# Patient Record
Sex: Female | Born: 1955
Health system: Southern US, Community
[De-identification: ages and names within clinical notes are randomized; demographics above are authoritative.]

## PROBLEM LIST (undated history)

## (undated) DIAGNOSIS — J309 Allergic rhinitis, unspecified: Secondary | ICD-10-CM

## (undated) DIAGNOSIS — R011 Cardiac murmur, unspecified: Secondary | ICD-10-CM

## (undated) DIAGNOSIS — F32A Depression, unspecified: Secondary | ICD-10-CM

## (undated) DIAGNOSIS — B182 Chronic viral hepatitis C: Secondary | ICD-10-CM

## (undated) DIAGNOSIS — F329 Major depressive disorder, single episode, unspecified: Secondary | ICD-10-CM

## (undated) HISTORY — DX: Allergic rhinitis, unspecified: J30.9

## (undated) HISTORY — DX: Cardiac murmur, unspecified: R01.1

## (undated) HISTORY — DX: Major depressive disorder, single episode, unspecified: F32.9

## (undated) HISTORY — DX: Chronic viral hepatitis C: B18.2

## (undated) HISTORY — DX: Depression, unspecified: F32.A

---

## 2009-03-18 ENCOUNTER — Emergency Department (HOSPITAL_BASED_OUTPATIENT_CLINIC_OR_DEPARTMENT_OTHER): Admission: EM | Admit: 2009-03-18 | Discharge: 2009-03-18 | Payer: Self-pay | Admitting: Emergency Medicine

## 2009-03-18 ENCOUNTER — Ambulatory Visit: Payer: Self-pay | Admitting: Diagnostic Radiology

## 2010-04-11 LAB — URINALYSIS, ROUTINE W REFLEX MICROSCOPIC
Ketones, ur: NEGATIVE mg/dL
Protein, ur: NEGATIVE mg/dL
Specific Gravity, Urine: 1.014 (ref 1.005–1.030)
Urobilinogen, UA: 0.2 mg/dL (ref 0.0–1.0)
pH: 6.5 (ref 5.0–8.0)

## 2010-04-11 LAB — URINE CULTURE: Culture: NO GROWTH

## 2011-02-17 ENCOUNTER — Ambulatory Visit: Payer: Self-pay

## 2011-02-18 ENCOUNTER — Ambulatory Visit: Payer: Self-pay | Admitting: Physician Assistant

## 2011-02-18 VITALS — BP 114/69 | HR 66 | Temp 97.6°F | Resp 16 | Ht 71.0 in | Wt 184.2 lb

## 2011-02-18 DIAGNOSIS — J329 Chronic sinusitis, unspecified: Secondary | ICD-10-CM

## 2011-02-18 MED ORDER — AMOXICILLIN 875 MG PO TABS: ORAL_TABLET | ORAL | Status: DC

## 2011-02-18 MED ORDER — IPRATROPIUM BROMIDE 0.06 % NA SOLN: 2.0000 | Freq: Four times a day (QID) | NASAL | Status: DC

## 2011-02-18 NOTE — Patient Instructions (Signed)

## 2011-02-19 NOTE — Progress Notes (Signed)
  Subjective:    Patient ID: Renee Rice, female    DOB: February 02, 1955, 56 y.o.   MRN: [id number]  HPI Ms. Ambrocio c/o 10 days for severe head congestion with thick yellow mucous from nose.  Getting worse.  Very little ST or cough. HA (frontal). Using OTC meds.     Review of Systems  HENT: Positive for congestion, sore throat, facial swelling and rhinorrhea. Negative for ear discharge.   Respiratory: Negative for cough and wheezing.   Musculoskeletal: Negative for myalgias.  Neurological: Positive for headaches.       Objective:   Physical Exam  Constitutional: Vital signs are normal. She appears well-developed and well-nourished.  HENT:  Right Ear: Tympanic membrane normal.  Left Ear: Tympanic membrane normal.  Nose: Mucosal edema and rhinorrhea present. Right sinus exhibits frontal sinus tenderness. Left sinus exhibits frontal sinus tenderness.  Mouth/Throat: Posterior oropharyngeal erythema present. No oropharyngeal exudate.  Eyes: Conjunctivae and lids are normal.  Cardiovascular: Normal rate and regular rhythm.   Pulmonary/Chest: Effort normal and breath sounds normal.  Lymphadenopathy:    She has no cervical adenopathy.          Assessment & Plan:   1. Sinusitis  amoxicillin (AMOXIL) 875 MG tablet, ipratropium (ATROVENT) 0.06 % nasal spray   Use Mucinex D Return if symptoms worsen or no improvement 3 days

## 2011-02-20 ENCOUNTER — Telehealth: Payer: Self-pay

## 2011-02-20 NOTE — Telephone Encounter (Signed)
Spoke with pt to check status. Pt reported she is "SO much better" and would like to hug Alicia's neck. Pt also apologized for being so "hateful" when she had to wait for a long time for OV. Apologized to pt again for long wait.

## 2011-10-22 ENCOUNTER — Ambulatory Visit (INDEPENDENT_AMBULATORY_CARE_PROVIDER_SITE_OTHER): Payer: PRIVATE HEALTH INSURANCE | Admitting: Physician Assistant

## 2011-10-22 VITALS — BP 115/70 | HR 69 | Temp 97.8°F | Resp 16 | Ht 72.0 in | Wt 181.0 lb

## 2011-10-22 DIAGNOSIS — L259 Unspecified contact dermatitis, unspecified cause: Secondary | ICD-10-CM

## 2011-10-22 DIAGNOSIS — L282 Other prurigo: Secondary | ICD-10-CM

## 2011-10-22 MED ORDER — METHYLPREDNISOLONE ACETATE 80 MG/ML IJ SUSP
80.0000 mg | Freq: Once | INTRAMUSCULAR | Status: AC
  Administered 2011-10-22: 80 mg via INTRAMUSCULAR

## 2011-10-22 MED ORDER — PREDNISONE 20 MG PO TABS: ORAL_TABLET | ORAL | Status: DC

## 2011-10-22 MED ORDER — CLOBETASOL PROPIONATE 0.05 % EX CREA: TOPICAL_CREAM | Freq: Two times a day (BID) | CUTANEOUS | Status: DC

## 2011-10-22 NOTE — Progress Notes (Signed)
  Subjective:    Patient ID: Renee Rice, female    DOB: 13-Apr-1955, 56 y.o.   MRN: [id number]  HPI 56 year old female presents with 1 week history of pruritic rash on bilateral arms and lower legs. She was working outside in her yard 1 week ago pulling vines and discovered there was poison ivy as well.  Since she has had progressive worsening of an erythematous, pruritic rash.  No SOB, trouble breathing, lip/tongue swelling, fever, or chills. She has been using several OTC topical agents including alcohol and calamine lotion as well as taking benadryl daily.     Review of Systems  Constitutional: Negative for fever and chills.  Respiratory: Negative for cough, chest tightness, shortness of breath and wheezing.   Skin: Positive for rash.  All other systems reviewed and are negative.       Objective:   Physical Exam  Constitutional: She is oriented to person, place, and time. She appears well-developed and well-nourished.  HENT:  Head: Normocephalic and atraumatic.  Right Ear: External ear normal.  Left Ear: External ear normal.  Eyes: Conjunctivae normal are normal.  Neck: Normal range of motion.  Cardiovascular: Normal rate.   Pulmonary/Chest: Effort normal.  Neurological: She is alert and oriented to person, place, and time.  Skin:       Diffuse erythematous linear rash over forearms and lower legs.   Psychiatric: She has a normal mood and affect. Her behavior is normal. Judgment and thought content normal.          Assessment & Plan:   1. Contact dermatitis  methylPREDNISolone acetate (DEPO-MEDROL) injection 80 mg, predniSONE (DELTASONE) 20 MG tablet, clobetasol cream (TEMOVATE) 0.05 %  2. Pruritic rash    Depomedrol 80 mg IM today Zyrtec daily in the morning Benadryl 50 mg qhs Prednisone taper Clobetasol bid

## 2011-10-22 NOTE — Patient Instructions (Addendum)
Take Zyrtec daily in the morning Benadryl 50 mg at bedtime Apply thin layer of Clobetasol cream to affected area twice daily  Poison [facility] ivy is a inflammation of the skin (contact dermatitis) caused by touching the allergens on the leaves of the ivy plant following previous exposure to the plant. The rash usually appears 48 hours after exposure. The rash is usually bumps (papules) or blisters (vesicles) in a linear pattern. Depending on your own sensitivity, the rash may simply cause redness and itching, or it may also progress to blisters which may break open. These must be well cared for to prevent secondary bacterial (germ) infection, followed by scarring. Keep any open areas dry, clean, dressed, and covered with an antibacterial ointment if needed. The eyes may also get puffy. The puffiness is worst in the morning and gets better as the day progresses. This dermatitis usually heals without scarring, within 2 to 3 weeks without treatment. HOME CARE INSTRUCTIONS  Thoroughly wash with soap and water as soon as you have been exposed to poison ivy. You have about one half hour to remove the plant resin before it will cause the rash. This washing will destroy the oil or antigen on the skin that is causing, or will cause, the rash. Be sure to wash under your fingernails as any plant resin there will continue to spread the rash. Do not rub skin vigorously when washing affected area. Poison ivy cannot spread if no oil from the plant remains on your body. A rash that has progressed to weeping sores will not spread the rash unless you have not washed thoroughly. It is also important to wash any clothes you have been wearing as these may carry active allergens. The rash will return if you wear the unwashed clothing, even several days later. Avoidance of the plant in the future is the best measure. Poison ivy plant can be recognized by the number of leaves. Generally, poison ivy has three leaves with flowering  branches on a single stem. Diphenhydramine may be purchased over the counter and used as needed for itching. Do not drive with this medication if it makes you drowsy.Ask your caregiver about medication for children. SEEK MEDICAL CARE IF:  Open sores develop.  Redness spreads beyond area of rash.  You notice purulent (pus-like) discharge.  You have increased pain.  Other signs of infection develop (such as fever). Document Released: 12/31/1999 Document Revised: 03/27/2011 Document Reviewed: 11/18/2008 [facility] Patient Information 2013 [place], [place].

## 2011-11-11 ENCOUNTER — Ambulatory Visit (INDEPENDENT_AMBULATORY_CARE_PROVIDER_SITE_OTHER): Payer: PRIVATE HEALTH INSURANCE | Admitting: Emergency Medicine

## 2011-11-11 VITALS — BP 110/70 | HR 73 | Temp 98.1°F | Resp 16 | Ht 71.0 in | Wt 182.0 lb

## 2011-11-11 DIAGNOSIS — Z1231 Encounter for screening mammogram for malignant neoplasm of breast: Secondary | ICD-10-CM

## 2011-11-11 DIAGNOSIS — Z0289 Encounter for other administrative examinations: Secondary | ICD-10-CM

## 2011-11-11 DIAGNOSIS — Z0000 Encounter for general adult medical examination without abnormal findings: Secondary | ICD-10-CM

## 2011-11-11 LAB — POCT CBC
HCT, POC: 51.5 % — AB (ref 37.7–47.9)
Hemoglobin: 16 g/dL (ref 12.2–16.2)
Lymph, poc: 2.6 (ref 0.6–3.4)
MCV: 94.2 fL (ref 80–97)
MID (cbc): 0.5 (ref 0–0.9)
MPV: 11.5 fL (ref 0–99.8)
POC Granulocyte: 4.6 (ref 2–6.9)
POC LYMPH PERCENT: 33.9 %L (ref 10–50)
POC MID %: 7 %M (ref 0–12)
Platelet Count, POC: 188 10*3/uL (ref 142–424)

## 2011-11-11 LAB — COMPREHENSIVE METABOLIC PANEL
Alkaline Phosphatase: 185 U/L — ABNORMAL HIGH (ref 39–117)
BUN: 17 mg/dL (ref 6–23)
Creat: 0.81 mg/dL (ref 0.50–1.10)
Glucose, Bld: 89 mg/dL (ref 70–99)
Total Bilirubin: 0.8 mg/dL (ref 0.3–1.2)
Total Protein: 7.5 g/dL (ref 6.0–8.3)

## 2011-11-11 LAB — LIPID PANEL
Total CHOL/HDL Ratio: 2.8 Ratio
VLDL: 26 mg/dL (ref 0–40)

## 2011-11-11 NOTE — Progress Notes (Signed)
[facility] [address] [place] [address] [phone]  Date:  11/11/2011   Name:  Renee Rice   DOB:  1955-05-31   MRN:  [id number]  PCP:  No primary provider on file.    Chief Complaint: Annual Exam   History of Present Illness:  Renee Rice is a 56 y.o. 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  Denies current medical problems or complaints.  No medications.    There is no problem list on file for this patient.   No past medical history on file.  No past surgical history on file.  History  Substance Use Topics  . Smoking status: Current Every Day Smoker -- 1.0 packs/day for 25 years    Types: Cigarettes  . Smokeless tobacco: Not on file  . Alcohol Use: No    No family history on file.  Allergies  Allergen Reactions  . Naproxen Other (See Comments)    Pt states she does not remember her reaction    Medication list has been reviewed and updated.  Current Outpatient Prescriptions on File Prior to Visit  Medication Sig Dispense Refill  . amoxicillin (AMOXIL) 875 MG tablet Take 2 tablets every 12 hours for 5 days  20 tablet  0  . clobetasol cream (TEMOVATE) 0.05 % Apply topically 2 (two) times daily.  30 g  0  . dextromethorphan-guaiFENesin (MUCINEX DM) 30-600 MG per 12 hr tablet Take 1 tablet by mouth every 12 (twelve) hours.      Marland Kitchen ipratropium (ATROVENT) 0.06 % nasal spray Place 2 sprays into the nose 4 (four) times daily.  15 mL  12  . predniSONE (DELTASONE) 20 MG tablet Take 3 PO QAM x3days, 2 PO QAM x3days, 1 PO QAM x3days  18 tablet  0  . Pseudoeph-Doxylamine-DM-APAP (NYQUIL) 60-7.06-14-998 MG/30ML LIQD Take by mouth.      . Pseudoephedrine-DM-GG-APAP (TYLENOL COLD) 30-15-200-325 MG TABS Take by mouth.        Review of Systems:  As per HPI, otherwise negative.    Physical Examination: Filed Vitals:   11/11/11 0906  BP: 155/89  Pulse: 73  Temp: 98.1 F (36.7 C)    Resp: 16   Filed Vitals:   11/11/11 0906  Height: 5\' 11"  (1.803 m)  Weight: 182 lb (82.555 kg)   Body mass index is 25.38 kg/(m^2). Ideal Body Weight: Weight in (lb) to have BMI = 25: 178.9   GEN: WDWN, NAD, Non-toxic, A & O x 3 HEENT: Atraumatic, Normocephalic. Neck supple. No masses, No LAD. Ears and Nose: No external deformity. CV: RRR, No M/G/R. No JVD. No thrill. No extra heart sounds. PULM: CTA B, no wheezes, crackles, rhonchi. No retractions. No resp. distress. No accessory muscle use. ABD: S, NT, ND, +BS. No rebound. No HSM. EXTR: No c/c/e NEURO Normal gait.  PSYCH: Normally interactive. Conversant. Not depressed or anxious appearing.  Calm demeanor.  Pelvic - normal external genitalia, vulva, vagina, cervix, uterus and adnexa   Assessment and Plan: DOT LABS Follow up as needed  Carmelina Dane, MD  I have reviewed and agree with documentation. Robert P. Merla Riches, M.D.

## 2011-11-13 LAB — PAP IG W/ RFLX HPV ASCU

## 2011-11-28 ENCOUNTER — Ambulatory Visit (HOSPITAL_COMMUNITY)
Admission: RE | Admit: 2011-11-28 | Discharge: 2011-11-28 | Disposition: A | Discharge status: Home / Self Care | Payer: PRIVATE HEALTH INSURANCE | Source: Ambulatory Visit | Attending: Emergency Medicine | Admitting: Emergency Medicine

## 2011-11-28 DIAGNOSIS — Z1231 Encounter for screening mammogram for malignant neoplasm of breast: Secondary | ICD-10-CM | POA: Insufficient documentation

## 2011-12-01 ENCOUNTER — Other Ambulatory Visit: Payer: Self-pay | Admitting: Emergency Medicine

## 2011-12-01 DIAGNOSIS — R921 Mammographic calcification found on diagnostic imaging of breast: Secondary | ICD-10-CM

## 2011-12-04 ENCOUNTER — Other Ambulatory Visit: Payer: Self-pay | Admitting: Emergency Medicine

## 2011-12-04 DIAGNOSIS — R928 Other abnormal and inconclusive findings on diagnostic imaging of breast: Secondary | ICD-10-CM

## 2011-12-18 ENCOUNTER — Ambulatory Visit
Admission: RE | Admit: 2011-12-18 | Discharge: 2011-12-18 | Disposition: A | Discharge status: Home / Self Care | Payer: PRIVATE HEALTH INSURANCE | Source: Ambulatory Visit | Attending: Emergency Medicine | Admitting: Emergency Medicine

## 2011-12-18 DIAGNOSIS — R928 Other abnormal and inconclusive findings on diagnostic imaging of breast: Secondary | ICD-10-CM

## 2013-05-14 ENCOUNTER — Ambulatory Visit (INDEPENDENT_AMBULATORY_CARE_PROVIDER_SITE_OTHER): Payer: No Typology Code available for payment source | Admitting: Family Medicine

## 2013-05-14 VITALS — BP 132/88 | HR 78 | Temp 98.4°F | Resp 16 | Ht 70.0 in | Wt 187.0 lb

## 2013-05-14 DIAGNOSIS — J019 Acute sinusitis, unspecified: Secondary | ICD-10-CM

## 2013-05-14 DIAGNOSIS — J329 Chronic sinusitis, unspecified: Secondary | ICD-10-CM

## 2013-05-14 DIAGNOSIS — R059 Cough, unspecified: Secondary | ICD-10-CM

## 2013-05-14 DIAGNOSIS — J209 Acute bronchitis, unspecified: Secondary | ICD-10-CM

## 2013-05-14 DIAGNOSIS — R05 Cough: Secondary | ICD-10-CM

## 2013-05-14 MED ORDER — IPRATROPIUM BROMIDE 0.06 % NA SOLN: 2.0000 | Freq: Four times a day (QID) | NASAL | Status: DC

## 2013-05-14 MED ORDER — BENZONATATE 100 MG PO CAPS: 100.0000 mg | ORAL_CAPSULE | Freq: Three times a day (TID) | ORAL | Status: DC | PRN

## 2013-05-14 MED ORDER — CEFDINIR 300 MG PO CAPS: 300.0000 mg | ORAL_CAPSULE | Freq: Two times a day (BID) | ORAL | Status: DC

## 2013-05-14 NOTE — Progress Notes (Signed)
Patient ID: Renee Rice, female   DOB: [birth year], 58 y.o.   MRN: [id number] [facility] [address], [place] [address] [phone]  Date:  05/14/2013   Name:  Renee Rice   DOB:  [birth year]   MRN:  [id number]  PCP:  No primary provider on file.    Chief Complaint: URI   History of Present Illness:  Renee Rice is a 58 y.o. very pleasant female patient who presents with the following:  Starting on Sunday pt began with headache, sneezing, rhinorrhea, cough, sore throat, and horseness.  Pt has hx of sinus infections in the past.  Pt was having sinus migraines throughtout the month.  Admits fever 101, and 100.7.  Pt has tried mucinex-dm, tyelenol congestion, advil with no relief. Also tried flonase for prior 2 days.  Admits sick contact w someone in the house w bronchitis.   Hasn't had a cigarette in 3 weeks.    She is generally quite healthy.  "I never come to the doctor."    There are no active problems to display for this patient.   Past Medical History  Diagnosis Date  . Allergic rhinitis   . Depression   . Heart murmur     History reviewed. No pertinent past surgical history.  History  Substance Use Topics  . Smoking status: Current Every Day Smoker -- 1.00 packs/day for 25 years    Types: Cigarettes  . Smokeless tobacco: Not on file  . Alcohol Use: Yes    Family History  Problem Relation Age of Onset  . Migraines Mother   . Migraines Maternal Grandmother     Allergies  Allergen Reactions  . Naproxen Other (See Comments)    Pt states she does not remember her reaction    Medication list has been reviewed and updated.  Current Outpatient Prescriptions on File Prior to Visit  Medication Sig Dispense Refill  . dextromethorphan-guaiFENesin (MUCINEX DM) 30-600 MG per 12 hr tablet Take 1 tablet by mouth every 12 (twelve) hours.      . Pseudoephedrine-DM-GG-APAP (TYLENOL COLD) 30-15-200-325 MG TABS Take by mouth.      Marland Kitchen  amoxicillin (AMOXIL) 875 MG tablet Take 2 tablets every 12 hours for 5 days  20 tablet  0  . clobetasol cream (TEMOVATE) 0.05 % Apply topically 2 (two) times daily.  30 g  0  . ipratropium (ATROVENT) 0.06 % nasal spray Place 2 sprays into the nose 4 (four) times daily.  15 mL  12  . predniSONE (DELTASONE) 20 MG tablet Take 3 PO QAM x3days, 2 PO QAM x3days, 1 PO QAM x3days  18 tablet  0  . Pseudoeph-Doxylamine-DM-APAP (NYQUIL) 60-7.06-14-998 MG/30ML LIQD Take by mouth.       No current facility-administered medications on file prior to visit.    Review of Systems:  Pt admits fever, chills, denies body aches Cardio - denies chest pain, admits tightness Resp - admits wheeze, cough,  HEENT - admits rhinorrhea, sneezing, itching eyes, sore throat, headache Ext - denies weakness,  Rash   Physical Examination: Filed Vitals:   05/14/13 0820  BP: 132/88  Pulse: 78  Temp: 98.4 F (36.9 C)  Resp: 16   Filed Vitals:   05/14/13 0820  Height: 5\' 10"  (1.778 m)  Weight: 187 lb (84.823 kg)   Body mass index is 26.83 kg/(m^2). Ideal Body Weight: Weight in (lb) to have BMI = 25: 173.9  GEN: WDWN, NAD, Non-toxic, A & O x 3,  mild overweight, looks well HEENT: Atraumatic, Normocephalic. Neck supple. No masses, No LAD.  Bilateral TM wnl, oropharynx normal.  PEERL,EOMI.  She is tender over her frontal sinuses.   Ears and Nose: erythematous and edematous turbinates, normal tympanic membranes CV: RRR, no M/R/G.  PULM: CTA B, no wheezes, crackles, rhonchi. No retractions. No resp. distress. No accessory muscle use. ABD: S, NT, ND, +BS. No rebound. No HSM. EXTR: No c/c/e NEURO Normal gait.  PSYCH: Normally interactive. Conversant. Not depressed or anxious appearing.  Calm demeanor.    Assessment and Plan: Sinusitis - Plan: ipratropium (ATROVENT) 0.06 % nasal spray  Cough - Plan: benzonatate (TESSALON) 100 MG capsule  Acute bronchitis - Plan: cefdinir (OMNICEF) 300 MG capsule  Sinusitis, acute  - Plan: cefdinir (OMNICEF) 300 MG capsule  1. Sinusitis - pt given omnicef, tessalon, and refill of atrovent.  Return to clinic if not better in 2 weeks or if symptoms worsen.  2. Given seasonal allergy recommendations including saline rinse, otc daily medications recs.   Signed Lamar Blinks, MD

## 2013-05-14 NOTE — Patient Instructions (Addendum)
You have been dx with a sinus infection.  Please take the antibiotic medication 1 tablet 2 times per day for 10 days.  Your prescription for atrovent has been refilled, please place 2 sprays into the nose 4 times daily.  You have been given tessalon for cough.  Please take 1 capsule up to 3 times per day as needed for cough.

## 2013-05-15 ENCOUNTER — Telehealth: Payer: Self-pay

## 2013-05-15 NOTE — Telephone Encounter (Signed)
Called to check on her.  She got the tessalon elsewhere.  She is about the same today She will let me know if not getting better the next day or two-

## 2013-05-15 NOTE — Telephone Encounter (Signed)
Fax from pharm stated all benzonatate is on backorder. Do you want to send in Rx for anything else?

## 2013-11-05 ENCOUNTER — Ambulatory Visit (INDEPENDENT_AMBULATORY_CARE_PROVIDER_SITE_OTHER): Payer: Self-pay | Admitting: Family Medicine

## 2013-11-05 VITALS — BP 116/76 | HR 65 | Temp 97.9°F | Resp 16 | Ht 70.75 in | Wt 180.2 lb

## 2013-11-05 DIAGNOSIS — Z0289 Encounter for other administrative examinations: Secondary | ICD-10-CM

## 2013-11-05 NOTE — Progress Notes (Signed)
Chief Complaint:  Chief Complaint  Patient presents with  . DOT PE    HPI: Renee Rice is a 58 y.o. female who is here for  DOT She has allergies but no other complaints.  No HTN, MI, OSA, DM   Past Medical History  Diagnosis Date  . Allergic rhinitis   . Heart murmur   . Depression     in the past, no meds, no previous hospitalization    History reviewed. No pertinent past surgical history. History   Social History  . Marital Status: Married    Spouse Name: N/A    Number of Children: N/A  . Years of Education: N/A   Occupational History  . Driver, Hydrologist    Social History Main Topics  . Smoking status: Current Every Day Smoker -- 1.00 packs/day for 25 years    Types: Cigarettes  . Smokeless tobacco: None  . Alcohol Use: Yes  . Drug Use: No  . Sexual Activity: Not Currently   Other Topics Concern  . None   Social History Narrative  . None   Family History  Problem Relation Age of Onset  . Migraines Mother   . Migraines Maternal Grandmother    Allergies  Allergen Reactions  . Naproxen Other (See Comments)    Pt states she does not remember her reaction   Prior to Admission medications   Medication Sig Start Date End Date Taking? Authorizing Provider  benzonatate (TESSALON) 100 MG capsule Take 1 capsule (100 mg total) by mouth 3 (three) times daily as needed for cough. 05/14/13   Gay Filler Copland, MD  cefdinir (OMNICEF) 300 MG capsule Take 1 capsule (300 mg total) by mouth 2 (two) times daily. 05/14/13   Gay Filler Copland, MD  clobetasol cream (TEMOVATE) 0.05 % Apply topically 2 (two) times daily. 10/22/11   Heather Elnora Morrison, PA-C  dextromethorphan-guaiFENesin (MUCINEX DM) 30-600 MG per 12 hr tablet Take 1 tablet by mouth every 12 (twelve) hours.    Historical Provider, MD  ipratropium (ATROVENT) 0.06 % nasal spray Place 2 sprays into the nose 4 (four) times daily. 05/14/13 05/14/14  Gay Filler Copland, MD  Pseudoeph-Doxylamine-DM-APAP (NYQUIL)  60-7.06-14-998 MG/30ML LIQD Take by mouth.    Historical Provider, MD  Pseudoephedrine-DM-GG-APAP (TYLENOL COLD) 30-15-200-325 MG TABS Take by mouth.    Historical Provider, MD     ROS: The patient denies fevers, chills, night sweats, unintentional weight loss, chest pain, palpitations, wheezing, dyspnea on exertion, nausea, vomiting, abdominal pain, dysuria, hematuria, melena, numbness, weakness, or tingling.  All other systems have been reviewed and were otherwise negative with the exception of those mentioned in the HPI and as above.    PHYSICAL EXAM: Filed Vitals:   11/05/13 1712  BP: 116/76  Pulse: 65  Temp: 97.9 F (36.6 C)  Resp: 16   Filed Vitals:   11/05/13 1712  Height: 5' 10.75" (1.797 m)  Weight: 180 lb 3.2 oz (81.738 kg)   Body mass index is 25.31 kg/(m^2).  General: Alert, no acute distress HEENT:  Normocephalic, atraumatic, oropharynx patent. EOMI, PERRLA, fundo exam nl Cardiovascular:  Regular rate and rhythm, no rubs murmurs or gallops.  No Carotid bruits, radial pulse intact. No pedal edema.  Respiratory: Clear to auscultation bilaterally.  No wheezes, rales, or rhonchi.  No cyanosis, no use of accessory musculature GI: No organomegaly, abdomen is soft and non-tender, positive bowel sounds.  No masses. Skin: No rashes. Neurologic: Facial musculature symmetric. Psychiatric: Patient is  appropriate throughout our interaction. Lymphatic: No cervical lymphadenopathy Musculoskeletal: Gait intact. 5/5 strength, 2/2 DTRs Full ROM in neck, Renee Rice and UE   LABS: Results for orders placed in visit on 11/11/11  COMPREHENSIVE METABOLIC PANEL      Result Value Ref Range   Sodium 141  135 - 145 mEq/L   Potassium 5.4 (*) 3.5 - 5.3 mEq/L   Chloride 105  96 - 112 mEq/L   CO2 29  19 - 32 mEq/L   Glucose, Bld 89  70 - 99 mg/dL   BUN 17  6 - 23 mg/dL   Creat 0.81  0.50 - 1.10 mg/dL   Total Bilirubin 0.8  0.3 - 1.2 mg/dL   Alkaline Phosphatase 185 (*) 39 - 117 U/L   AST 66  (*) 0 - 37 U/L   ALT 63 (*) 0 - 35 U/L   Total Protein 7.5  6.0 - 8.3 g/dL   Albumin 4.1  3.5 - 5.2 g/dL   Calcium 9.8  8.4 - 10.5 mg/dL  TSH      Result Value Ref Range   TSH 1.562  0.350 - 4.500 uIU/mL  VITAMIN D 25 HYDROXY      Result Value Ref Range   Vit D, 25-Hydroxy 45  30 - 89 ng/mL  LIPID PANEL      Result Value Ref Range   Cholesterol 161  0 - 200 mg/dL   Triglycerides 131  <150 mg/dL   HDL 57  >39 mg/dL   Total CHOL/HDL Ratio 2.8     VLDL 26  0 - 40 mg/dL   LDL Cholesterol 78  0 - 99 mg/dL  POCT CBC      Result Value Ref Range   WBC 7.7  4.6 - 10.2 K/uL   Lymph, poc 2.6  0.6 - 3.4   POC LYMPH PERCENT 33.9  10 - 50 %L   MID (cbc) 0.5  0 - 0.9   POC MID % 7.0  0 - 12 %M   POC Granulocyte 4.6  2 - 6.9   Granulocyte percent 59.1  37 - 80 %G   RBC 5.47  4.04 - 5.48 M/uL   Hemoglobin 16.0  12.2 - 16.2 g/dL   HCT, POC 51.5 (*) 37.7 - 47.9 %   MCV 94.2  80 - 97 fL   MCH, POC 29.3  27 - 31.2 pg   MCHC 31.1 (*) 31.8 - 35.4 g/dL   RDW, POC 13.6     Platelet Count, POC 188  142 - 424 K/uL   MPV 11.5  0 - 99.8 fL  IFOBT (OCCULT BLOOD)      Result Value Ref Range   IFOBT Negative    PAP IG W/ RFLX HPV ASCU      Result Value Ref Range   Specimen adequacy:       FINAL DIAGNOSIS:       COMMENTS:       Cytotechnologist:         EKG/XRAY:   Primary read interpreted by Dr. Marin Comment at [facility].   ASSESSMENT/PLAN: Encounter Diagnosis  Name Primary?  . Examination, physical, employee Yes   DOT for 2 years Advise to stop smoking F/u prn or in 2 years for DOT   Gross sideeffects, risk and benefits, and alternatives of medications d/w patient. Patient is aware that all medications have potential sideeffects and we are unable to predict every sideeffect or drug-drug interaction that may occur.  Renee Rice, Guin, DO  11/06/2013 11:54 AM

## 2015-10-15 ENCOUNTER — Ambulatory Visit (INDEPENDENT_AMBULATORY_CARE_PROVIDER_SITE_OTHER): Payer: Self-pay | Admitting: Physician Assistant

## 2015-10-15 VITALS — BP 120/76 | HR 75 | Temp 98.1°F | Resp 16 | Ht 71.0 in | Wt 180.0 lb

## 2015-10-15 DIAGNOSIS — Z0289 Encounter for other administrative examinations: Secondary | ICD-10-CM

## 2015-10-15 NOTE — Patient Instructions (Signed)
     IF you received an x-ray today, you will receive an invoice from [facility]. Please contact [facility] at [phone] with questions or concerns regarding your invoice.   IF you received labwork today, you will receive an invoice from [facility]. Please contact [facility] at [phone] with questions or concerns regarding your invoice.   Our billing staff will not be able to assist you with questions regarding bills from these companies.  You will be contacted with the lab results as soon as they are available. The fastest way to get your results is to activate your My Chart account. Instructions are located on the last page of this paperwork. If you have not heard from us regarding the results in 2 weeks, please contact this office.      

## 2015-10-15 NOTE — Progress Notes (Signed)
Commercial Driver Medical Examination   Renee Rice is a 60 y.o. female who presents today for a commercial driver fitness determination physical exam. The patient reports no problems today. In the past the patient reports receiving 2 year certificates. She denies focal neurological deficits, vision and hearing changes. She denies the habitual use of benzodiazepines and opioids.  Past Medical History:  Diagnosis Date  . Allergic rhinitis   . Depression    in the past, no meds, no previous hospitalization   . Heart murmur     Current medications, family history, allergies, social history reviewed by me and exist elsewhere in the encounter.   Review of Systems  Constitutional: Negative for chills and fever.  Eyes: Negative for blurred vision.  Respiratory: Negative for cough and shortness of breath.   Cardiovascular: Negative for chest pain.  Gastrointestinal: Negative for abdominal pain and nausea.  Genitourinary: Negative for dysuria, frequency and urgency.  Musculoskeletal: Negative for myalgias.  Skin: Negative for rash.  Neurological: Negative for dizziness, tingling and headaches.  Psychiatric/Behavioral: Negative for depression. The patient is not nervous/anxious.     Objective:     Vision/hearing:  Visual Acuity Screening   Right eye Left eye Both eyes  Without correction:     With correction: 20/25 20/25 20/20   Comments: Peripheral Vision: Right eye 85 degrees. Left eye 85 degrees.  The patient can distinguish the colors red, amber and green.  Hearing Screening Comments: The patient was able to hear a forced whisper from 10 feet.   Corrective lenses required: Yes  Monocular Vision?: No  Hearing aid requirement: No  Physical Exam  Constitutional: She is oriented to person, place, and time. She appears well-nourished. No distress.  Eyes: EOM are normal. Pupils are equal, round, and reactive to light.  Cardiovascular: Normal rate and regular rhythm.    Pulmonary/Chest: Effort normal and breath sounds normal.  Abdominal: She exhibits no distension.  Musculoskeletal: Normal range of motion.  Neurological: She is alert and oriented to person, place, and time. She has normal strength. She displays no atrophy and no tremor. No cranial nerve deficit or sensory deficit. She exhibits normal muscle tone. She displays a negative Romberg sign. She displays no seizure activity. Coordination and gait normal.  Skin: Skin is warm and dry. She is not diaphoretic.  Psychiatric: She has a normal mood and affect.  Vitals reviewed.   BP 120/76 (BP Location: Right Arm, Patient Position: Sitting, Cuff Size: Normal)   Pulse 75   Temp 98.1 F (36.7 C) (Oral)   Resp 16   Ht 5\' 11"  (1.803 m)   Wt 180 lb (81.6 kg)   SpO2 98%   BMI 25.10 kg/m   Labs: Comments: sp. gr. 1.015, protein neg., blood neg., sugar neg.  Assessment:    Healthy female exam.  Meets standards in 92 CFR 391.41;  qualifies for 2 year certificate.    Plan:    Medical examiners certificate completed and printed. Return as needed.

## 2016-01-18 ENCOUNTER — Ambulatory Visit (INDEPENDENT_AMBULATORY_CARE_PROVIDER_SITE_OTHER): Payer: Self-pay | Admitting: Physician Assistant

## 2016-01-18 VITALS — BP 120/78 | HR 76 | Temp 98.5°F | Resp 16 | Ht 71.0 in | Wt 190.0 lb

## 2016-01-18 DIAGNOSIS — J0140 Acute pansinusitis, unspecified: Secondary | ICD-10-CM

## 2016-01-18 MED ORDER — AMOXICILLIN-POT CLAVULANATE 875-125 MG PO TABS: 1.0000 | ORAL_TABLET | Freq: Two times a day (BID) | ORAL | 0 refills | Status: DC

## 2016-01-18 MED ORDER — GUAIFENESIN ER 1200 MG PO TB12: 1.0000 | ORAL_TABLET | Freq: Two times a day (BID) | ORAL | 0 refills | Status: DC | PRN

## 2016-01-18 MED ORDER — IPRATROPIUM BROMIDE 0.03 % NA SOLN: 2.0000 | Freq: Two times a day (BID) | NASAL | 0 refills | Status: DC

## 2016-01-18 NOTE — Patient Instructions (Addendum)
IF you received an x-ray today, you will receive an invoice from [facility]. Please contact [facility] at [phone] with questions or concerns regarding your invoice.   IF you received labwork today, you will receive an invoice from [place]. Please contact [facility] at [phone] with questions or concerns regarding your invoice.   Our billing staff will not be able to assist you with questions regarding bills from these companies.  You will be contacted with the lab results as soon as they are available. The fastest way to get your results is to activate your My Chart account. Instructions are located on the last page of this paperwork. If you have not heard from [place] regarding the results in 2 weeks, please contact this office.   Use maximum strength mucinex, and a nasal spray.  Be careful with such medicines containing pseudoephedrine or phenylephrine.  Continue to hydrate well with water, 64 oz per day, and take a probiotic or yogurt while taking the antibiotic.   Sinusitis, Adult Sinusitis is soreness and inflammation of your sinuses. Sinuses are hollow spaces in the bones around your face. They are located:  Around your eyes.  In the middle of your forehead.  Behind your nose.  In your cheekbones. Your sinuses and nasal passages are lined with a stringy fluid (mucus). Mucus normally drains out of your sinuses. When your nasal tissues get inflamed or swollen, the mucus can get trapped or blocked so air cannot flow through your sinuses. This lets bacteria, viruses, and funguses grow, and that leads to infection. Follow these instructions at home: Medicines  Take, use, or apply over-the-counter and prescription medicines only as told by your doctor. These may include nasal sprays.  If you were prescribed an antibiotic medicine, take it as told by your doctor. Do not stop taking the antibiotic even if you start to feel better. Hydrate and Humidify  Drink  enough water to keep your pee (urine) clear or pale yellow.  Use a cool mist humidifier to keep the humidity level in your home above 50%.  Breathe in steam for 10-15 minutes, 3-4 times a day or as told by your doctor. You can do this in the bathroom while a hot shower is running.  Try not to spend time in cool or dry air. Rest  Rest as much as possible.  Sleep with your head raised (elevated).  Make sure to get enough sleep each night. General instructions  Put a warm, moist washcloth on your face 3-4 times a day or as told by your doctor. This will help with discomfort.  Wash your hands often with soap and water. If there is no soap and water, use hand sanitizer.  Do not smoke. Avoid being around people who are smoking (secondhand smoke).  Keep all follow-up visits as told by your doctor. This is important. Contact a doctor if:  You have a fever.  Your symptoms get worse.  Your symptoms do not get better within 10 days. Get help right away if:  You have a very bad headache.  You cannot stop throwing up (vomiting).  You have pain or swelling around your face or eyes.  You have trouble seeing.  You feel confused.  Your neck is stiff.  You have trouble breathing. This information is not intended to replace advice given to you by your health care provider. Make sure you discuss any questions you have with your health care provider. Document Released: 06/21/2007 Document Revised: 08/29/2015 Document  Reviewed: 10/28/2014 Elsevier Interactive Patient Education  2017 [employer].   We recommend that you schedule a mammogram for breast cancer screening. Typically, you do not need a referral to do this. Please contact a local imaging center to schedule your mammogram.  [facility] - [phone]  *ask for the Radiology Department [facility] - [phone] or [phone]  [facility] - [phone] [place] [phone] [facility] - [phone]  *ask for the [facility] - [phone]  *ask for the Radiology Department [facility] - [phone]  *ask for the [place] - [phone]

## 2016-01-18 NOTE — Progress Notes (Signed)
[facility] [address], [place] [address] [phone]  Date:  01/18/2016   Name:  Renee Rice   DOB:  Jun 01, 1955   MRN:  [id number]  PCP:  No PCP Per Patient    History of Present Illness:  Renee Rice is a 61 y.o. female patient who presents to [facility] for cc of nasal congestion, rhinorrhea, and fatigue.    About 9 days ago, was having nasal congestion.  She took sinus medication.  Rhinorrhea.  Fatigue and malaise with subjective fever and chills.  maxillarty and frontal pain.  Coughing up yellow mucus.  Nose with same coughing worse in the morning.   Mucinex cold and flu, tylenol severe allergy.    There are no active problems to display for this patient.   Past Medical History:  Diagnosis Date  . Allergic rhinitis   . Depression    in the past, no meds, no previous hospitalization   . Heart murmur     No past surgical history on file.  Social History  Substance Use Topics  . Smoking status: Current Every Day Smoker    Packs/day: 1.00    Years: 25.00    Types: Cigarettes  . Smokeless tobacco: Never Used  . Alcohol use Yes    Family History  Problem Relation Age of Onset  . Migraines Mother   . Migraines Maternal Grandmother     Allergies  Allergen Reactions  . Naproxen Other (See Comments)    Pt states she does not remember her reaction    Medication list has been reviewed and updated.  No current outpatient prescriptions on file prior to visit.   No current facility-administered medications on file prior to visit.     ROS ROS otherwise unremarkable unless listed above.   Physical Examination: BP 120/78 (BP Location: Right Arm, Patient Position: Sitting, Cuff Size: Normal)   Pulse 76   Temp 98.5 F (36.9 C)   Resp 16   Ht 5\' 11"  (1.803 m)   Wt 190 lb (86.2 kg)   SpO2 96%   BMI 26.50 kg/m  Ideal Body Weight: Weight in (lb) to have BMI = 25: 178.9  Physical Exam  Constitutional: She is oriented to person, place,  and time. She appears well-developed and well-nourished. No distress.  HENT:  Head: Normocephalic and atraumatic.  Right Ear: Tympanic membrane, external ear and ear canal normal.  Left Ear: Tympanic membrane, external ear and ear canal normal.  Nose: Mucosal edema and rhinorrhea present. Right sinus exhibits maxillary sinus tenderness and frontal sinus tenderness. Left sinus exhibits maxillary sinus tenderness and frontal sinus tenderness.  Mouth/Throat: No uvula swelling. No oropharyngeal exudate, posterior oropharyngeal edema or posterior oropharyngeal erythema.  Eyes: Conjunctivae and EOM are normal. Pupils are equal, round, and reactive to light.  Cardiovascular: Normal rate and regular rhythm.  Exam reveals no gallop, no distant heart sounds and no friction rub.   No murmur heard. Pulmonary/Chest: Effort normal. No respiratory distress. She has no decreased breath sounds. She has no wheezes. She has no rhonchi.  Lymphadenopathy:       Head (right side): No submandibular, no tonsillar, no preauricular and no posterior auricular adenopathy present.       Head (left side): No submandibular, no tonsillar, no preauricular and no posterior auricular adenopathy present.  Neurological: She is alert and oriented to person, place, and time.  Skin: She is not diaphoretic.  Psychiatric: She has a normal mood and affect. Her behavior  is normal.     Assessment and Plan: Zonya Curtiss is a 61 y.o. female who is here today for cc of nasal congestion, sinus pressure, and cough. Will cover for bacterial etiology.   Acute non-recurrent pansinusitis - Plan: Guaifenesin (MUCINEX MAXIMUM STRENGTH) 1200 MG TB12, ipratropium (ATROVENT) 0.03 % nasal spray, amoxicillin-clavulanate (AUGMENTIN) 875-125 MG tablet  Renee Drape, PA-C [facility] 1/7/20186:14 PM

## 2017-02-10 ENCOUNTER — Ambulatory Visit (INDEPENDENT_AMBULATORY_CARE_PROVIDER_SITE_OTHER): Payer: PRIVATE HEALTH INSURANCE | Admitting: Physician Assistant

## 2017-02-10 ENCOUNTER — Other Ambulatory Visit: Payer: Self-pay

## 2017-02-10 ENCOUNTER — Encounter: Payer: Self-pay | Admitting: Physician Assistant

## 2017-02-10 VITALS — BP 122/82 | HR 76 | Temp 98.1°F | Resp 16 | Ht 71.0 in | Wt 188.6 lb

## 2017-02-10 DIAGNOSIS — J22 Unspecified acute lower respiratory infection: Secondary | ICD-10-CM

## 2017-02-10 DIAGNOSIS — F17210 Nicotine dependence, cigarettes, uncomplicated: Secondary | ICD-10-CM

## 2017-02-10 MED ORDER — AZITHROMYCIN 250 MG PO TABS: ORAL_TABLET | ORAL | 0 refills | Status: AC

## 2017-02-10 NOTE — Patient Instructions (Addendum)
Physical ahead and start an antibiotic given that your symptoms have been going on now for about 2 weeks.  Please continue all your home therapies.    IF you received an x-ray today, you will receive an invoice from [facility]. Please contact [facility] at [phone] with questions or concerns regarding your invoice.   IF you received labwork today, you will receive an invoice from [place]. Please contact [facility] at [phone] with questions or concerns regarding your invoice.   Our billing staff will not be able to assist you with questions regarding bills from these companies.  You will be contacted with the lab results as soon as they are available. The fastest way to get your results is to activate your My Chart account. Instructions are located on the last page of this paperwork. If you have not heard from [place] regarding the results in 2 weeks, please contact this office.

## 2017-02-10 NOTE — Progress Notes (Signed)
a   02/10/2017 8:28 AM   DOB: [birth year] / MRN: [id number]  SUBJECTIVE:  Renee Rice is a 62 y.o. female presenting for sinus congestion times 1 month.  She is a smoker with a 25-pack-year history.  Associates cough.  Denies fever, chills, nausea.  Tolerating p.o.  She feels that she is getting worse.   She is allergic to naproxen.   She  has a past medical history of Allergic rhinitis, Depression, and Heart murmur.    She  reports that she has been smoking cigarettes.  She has a 25.00 pack-year smoking history. she has never used smokeless tobacco. She reports that she drinks alcohol. She reports that she does not use drugs. She  reports that she does not currently engage in sexual activity. The patient  has no past surgical history on file.  Her family history includes Migraines in her maternal grandmother and mother.  Review of Systems  Constitutional: Negative for fever.  Respiratory: Positive for cough and sputum production. Negative for hemoptysis, shortness of breath and wheezing.   Cardiovascular: Negative for chest pain and leg swelling.  Gastrointestinal: Negative for nausea.  Skin: Negative for itching and rash.  Neurological: Negative for dizziness.    The problem list and medications were reviewed and updated by myself where necessary and exist elsewhere in the encounter.   OBJECTIVE:  BP 122/82 (BP Location: Left Arm, Patient Position: Sitting, Cuff Size: Normal)   Pulse 76   Temp 98.1 F (36.7 C) (Oral)   Resp 16   Ht 5\' 11"  (1.803 m)   Wt 188 lb 9.6 oz (85.5 kg)   SpO2 95%   BMI 26.30 kg/m   Physical Exam  Constitutional: She is oriented to person, place, and time. She is active.  Non-toxic appearance.  Cardiovascular: Normal rate, regular rhythm, S1 normal, S2 normal, normal heart sounds and intact distal pulses. Exam reveals no gallop, no friction rub and no decreased pulses.  No murmur heard. Pulmonary/Chest: Effort normal. No stridor. No tachypnea. No  respiratory distress. She has no wheezes. She has no rales.  Abdominal: She exhibits no distension.  Musculoskeletal: She exhibits no edema.  Neurological: She is alert and oriented to person, place, and time.  Skin: Skin is warm and dry. She is not diaphoretic. No pallor.    No results found for this or any previous visit (from the past 72 hour(s)).  No results found.  ASSESSMENT AND PLAN:  [name] was seen today for sinus problem and cough.  Diagnoses and all orders for this visit:  Acute lower respiratory tract infection Comments: Symptoms greater than 2 weeks.  Given that she is a long-term smoker there is likely some COPD component.  Will cover for an atypical.  RTC as needed.  Orders: -     azithromycin (ZITHROMAX) 250 MG tablet; Take 2 tabs PO x 1 dose, then 1 tab PO QD x 4 days  Cigarette smoker    The patient is advised to call or return to clinic if she does not see an improvement in symptoms, or to seek the care of the closest emergency department if she worsens with the above plan.   Philis Fendt, MHS, PA-C [facility] 02/10/2017 8:28 AM

## 2017-03-12 ENCOUNTER — Other Ambulatory Visit: Payer: Self-pay

## 2017-03-12 ENCOUNTER — Encounter: Payer: Self-pay | Admitting: Physician Assistant

## 2017-03-12 ENCOUNTER — Ambulatory Visit (INDEPENDENT_AMBULATORY_CARE_PROVIDER_SITE_OTHER): Payer: PRIVATE HEALTH INSURANCE | Admitting: Physician Assistant

## 2017-03-12 VITALS — BP 143/87 | HR 70 | Temp 97.8°F | Resp 16 | Ht 71.0 in | Wt 192.6 lb

## 2017-03-12 DIAGNOSIS — M545 Low back pain, unspecified: Secondary | ICD-10-CM

## 2017-03-12 DIAGNOSIS — Z1159 Encounter for screening for other viral diseases: Secondary | ICD-10-CM

## 2017-03-12 DIAGNOSIS — Z114 Encounter for screening for human immunodeficiency virus [HIV]: Secondary | ICD-10-CM | POA: Diagnosis not present

## 2017-03-12 LAB — POCT GLYCOSYLATED HEMOGLOBIN (HGB A1C): HEMOGLOBIN A1C: 5.1

## 2017-03-12 MED ORDER — PREDNISONE 20 MG PO TABS: ORAL_TABLET | ORAL | 0 refills | Status: AC

## 2017-03-12 MED ORDER — CYCLOBENZAPRINE HCL 10 MG PO TABS: 5.0000 mg | ORAL_TABLET | Freq: Three times a day (TID) | ORAL | 0 refills | Status: DC | PRN

## 2017-03-12 MED ORDER — TRAMADOL HCL 50 MG PO TABS: 50.0000 mg | ORAL_TABLET | Freq: Three times a day (TID) | ORAL | 0 refills | Status: DC

## 2017-03-12 NOTE — Progress Notes (Signed)
03/12/2017 10:55 AM   DOB: [birth year] / MRN: [id number]  SUBJECTIVE:  Renee Rice is a 62 y.o. female presenting for low right-sided back pain that started after picking up a bag of potting soil about a week ago.  She tells me that she had no pain immediately after the incident but noticed her back was hurting roughly 4-5 hours after she bent over to pick something up.  She denies any weakness and paresthesia to the legs.  She is tried naproxen however this causes her stomach to become upset.  She has also tried Tylenol 500 mg at 1500 mg which did not really help her pain.  The naproxen did help some.  She denies bowel and bladder incontinence.  The pain is made worse by going from standing to sitting and vice versa.  While drawing the patient's blood the patient advised the lab tech that her husband has a history of hepatitis C.  She has not been screened for this and would like this today.  The lab tech did draw extra blood.  I am happy to oblige.  I will also add on HIV.  She is allergic to naproxen.   She  has a past medical history of Allergic rhinitis, Depression, and Heart murmur.    She  reports that she has been smoking cigarettes.  She has a 25.00 pack-year smoking history. she has never used smokeless tobacco. She reports that she drinks alcohol. She reports that she does not use drugs. She  reports that she does not currently engage in sexual activity. The patient  has no past surgical history on file.  Her family history includes Migraines in her maternal grandmother and mother.  ROS As per HPI   The problem list and medications were reviewed and updated by myself where necessary and exist elsewhere in the encounter.   OBJECTIVE:  BP (!) 143/87 (BP Location: Right Arm, Patient Position: Sitting, Cuff Size: Normal)   Pulse 70   Temp 97.8 F (36.6 C) (Oral)   Resp 16   Ht 5\' 11"  (1.803 m)   Wt 192 lb 9.6 oz (87.4 kg)   SpO2 98%   BMI 26.86 kg/m   Physical Exam    Constitutional: She is oriented to person, place, and time. She is active.  Non-toxic appearance.  Cardiovascular: Normal rate.  Pulmonary/Chest: Effort normal. No tachypnea.  Musculoskeletal: Normal range of motion. She exhibits tenderness (About the right lumbosacral paraspinals negative straight leg raise test..  Back negative for rash and mass). She exhibits no edema or deformity.  Neurological: She is alert and oriented to person, place, and time. She has normal reflexes. She displays normal reflexes. She exhibits normal muscle tone.  Skin: Skin is warm and dry. She is not diaphoretic. No pallor.    Results for orders placed or performed in visit on 03/12/17 (from the past 72 hour(s))  POCT glycosylated hemoglobin (Hb A1C)     Status: None   Collection Time: 03/12/17 10:54 AM  Result Value Ref Range   Hemoglobin A1C 5.1     No results found.  ASSESSMENT AND PLAN:  Renee Rice was seen today for back pain.  Diagnoses and all orders for this visit:  Acute right-sided low back pain without sciatica: She has no history of osteoporosis.  Her posture is excellent.  An oblique there is any reason to suspect a compression fracture here.  NSAIDs do cause some GI distress for her and this is not new.  She  has no history of diabetes however I cannot prove that today.  I am checking A1c in-house and if normal will proceed to treat her with a prednisone taper, Flexeril, tramadol.  Of note she denies dysuria and hematuria. -     POCT glycosylated hemoglobin (Hb A1C)    The patient is advised to call or return to clinic if she does not see an improvement in symptoms, or to seek the care of the closest emergency department if she worsens with the above plan.   Philis Fendt, MHS, PA-C [facility] 03/12/2017 10:55 AM

## 2017-03-12 NOTE — Patient Instructions (Signed)
Thanks for coming in today.  I want to see you back in 2 weeks if you are not getting that much better with today's treatment.  The prednisone taper will go for 9 days.  I will give you enough tramadol and Flexeril the last for about 3 weeks.  If you get better and you do not need to come back.  I am sorry you hurt your back.  I hope the medications make you feel better.Renee Rice

## 2017-03-13 LAB — HEPATITIS C ANTIBODY

## 2017-03-13 LAB — HIV ANTIBODY (ROUTINE TESTING W REFLEX): HIV SCREEN 4TH GENERATION: NONREACTIVE

## 2017-03-14 ENCOUNTER — Encounter: Payer: Self-pay | Admitting: Physician Assistant

## 2017-03-14 ENCOUNTER — Other Ambulatory Visit: Payer: Self-pay | Admitting: Physician Assistant

## 2017-03-14 DIAGNOSIS — B182 Chronic viral hepatitis C: Secondary | ICD-10-CM

## 2017-03-14 NOTE — Progress Notes (Signed)
Please add on LFT.

## 2017-03-14 NOTE — Progress Notes (Signed)
Please let her know I am sending her to Dr. Linus Salmons in infectious disease.  She knows her antibody is positive. Philis Fendt, MS, PA-C 3:19 PM, 03/14/2017

## 2017-03-14 NOTE — Addendum Note (Signed)
Addended by: Gari Crown D on: 03/14/2017 03:18 PM   Modules accepted: Orders

## 2017-03-15 LAB — HCV RNA QUANT RFLX ULTRA OR GENOTYP
HCV Quant Baseline: 2490000 IU/mL
HCV log10: 6.396 log10 IU/mL

## 2017-03-15 LAB — SPECIMEN STATUS REPORT

## 2017-03-15 LAB — HEPATITIS C GENOTYPE

## 2017-03-15 NOTE — Addendum Note (Signed)
Addended by: Gari Crown D on: 03/15/2017 12:33 PM   Modules accepted: Orders

## 2017-03-17 LAB — HEPATIC FUNCTION PANEL
ALT: 41 IU/L — AB (ref 0–32)
AST: 83 IU/L — AB (ref 0–40)
Albumin: 3.2 g/dL — ABNORMAL LOW (ref 3.6–4.8)
Alkaline Phosphatase: 271 IU/L — ABNORMAL HIGH (ref 39–117)
BILIRUBIN, DIRECT: 0.2 mg/dL (ref 0.00–0.40)
Bilirubin Total: 0.4 mg/dL (ref 0.0–1.2)
Total Protein: 7 g/dL (ref 6.0–8.5)

## 2017-03-17 LAB — SPECIMEN STATUS REPORT

## 2017-04-20 ENCOUNTER — Encounter: Payer: PRIVATE HEALTH INSURANCE | Admitting: Infectious Diseases

## 2017-04-27 ENCOUNTER — Ambulatory Visit (INDEPENDENT_AMBULATORY_CARE_PROVIDER_SITE_OTHER): Payer: PRIVATE HEALTH INSURANCE | Admitting: Infectious Diseases

## 2017-04-27 ENCOUNTER — Encounter: Payer: Self-pay | Admitting: Infectious Diseases

## 2017-04-27 VITALS — BP 150/93 | HR 73 | Temp 97.9°F | Ht 71.0 in | Wt 191.0 lb

## 2017-04-27 DIAGNOSIS — B182 Chronic viral hepatitis C: Secondary | ICD-10-CM

## 2017-04-27 HISTORY — DX: Chronic viral hepatitis C: B18.2

## 2017-04-27 NOTE — Progress Notes (Signed)
[facility] for Infectious Disease   CC: consideration for treatment for chronic hepatitis C  HPI: Renee Rice is a 62 y.o. female who presents for initial evaluation and management of chronic hepatitis C.  Patient tested positive with Hep C RNA 2,490,000 on 03/15/2017. Hepatitis C-associated risk factors present are: prior IV drug use 'a few times', years of sexual contact with Hep C (+) husband, whom was treated with [facility] 3 years ago. Patient denies accidental needle stick, history of blood transfusion, multiple sexual partners, renal dialysis, tattoos. Patient has not had other studies performed. Patient has not had prior treatment for Hepatitis C. Patient does not have a past history of liver disease. Patient does not have a family history of liver disease. Patient does not  have associated signs or symptoms related to liver disease.   Hepatitis A/B immunity is unknown. She does take tylenol frequently but no more than 1000 mg daily for back pain. She does not drink alcohol presently but at one point did describe herself as a heavy drinker. She does not use illicit drugs including IV drugs (quit this decades ago).   Review of Systems:  Constitutional: negative Eyes: negative, no scleral icterus  Respiratory: negative Cardiovascular: negative Gastrointestinal: negative except for rash on stomach she believes is psoriasis Genitourinary: negative Musculoskeletal: negative Neurological: negative Behavioral/Psych: negative All other systems reviewed and are negative     Past Medical History:  Diagnosis Date  . Allergic rhinitis   . Chronic viral hepatitis C ([place]) 04/27/2017  . Depression    in the past, no meds, no previous hospitalization   . Heart murmur    Allergies  Allergen Reactions  . Naproxen Other (See Comments)    Pt states she does not remember her reaction    Social History   Tobacco Use  . Smoking status: Current Every Day Smoker    Packs/day: 1.00   Years: 25.00    Pack years: 25.00    Types: Cigarettes  . Smokeless tobacco: Never Used  Substance Use Topics  . Alcohol use: Yes    Comment: occassional 2 beers a month  . Drug use: No    Family History  Problem Relation Age of Onset  . Migraines Mother   . Migraines Maternal Grandmother     Objective:  Constitutional: in no apparent distress, well developed and well nourished, alert and oriented times 3,  Vitals:   04/27/17 1015  BP: (!) 150/93  Pulse: 73  Temp: 97.9 F (36.6 C)   Eyes: anicteric Cardiovascular: Cor RRR and No murmurs Respiratory: clear Gastrointestinal: Bowel sounds are normal, liver is not enlarged, spleen is not enlarged Musculoskeletal: peripheral pulses normal, no pedal edema, no clubbing or cyanosis Skin: negative for - jaundice, spider hemangioma, telangiectasia, palmar erythema, ecchymosis and atrophy; no porphyria cutanea tarda. Salmon/pink colored rash encircling her umbilicus with silvery scales; similar patch with more scales on left parietal skull.  Lymphatic: no cervical lymphadenopathy  Laboratory Genotype: 1a  HCV viral load: 2,400,000   Lab Results  Component Value Date   WBC 7.7 11/11/2011   HGB 16.0 11/11/2011   HCT 51.5 (A) 11/11/2011   MCV 94.2 11/11/2011    Lab Results  Component Value Date   CREATININE 0.81 11/11/2011   BUN 17 11/11/2011   NA 141 11/11/2011   K 5.4 (H) 11/11/2011   CL 105 11/11/2011   CO2 29 11/11/2011    Lab Results  Component Value Date   ALT 41 (H) 03/12/2017  AST 83 (H) 03/12/2017   ALKPHOS 271 (H) 03/12/2017    Lab Results  Component Value Date   BILITOT 0.4 03/12/2017   ALBUMIN 3.2 (L) 03/12/2017    Labs and history reviewed and show CHILD-PUGH pending further studies  5-6 points: Child class A 7-9 points: Child class B 10-15 points: Child class C  Assessment & Plan:  Problem List Items Addressed This Visit      Digestive   Chronic viral hepatitis C ([place]) - Primary    New  Patient with Chronic Hepatitis C genotype 1a, treatment naive.   I discussed with the patient the lab findings that confirm chronic hepatitis C as well as the natural history and progression of disease including about 30% of people who develop cirrhosis of the liver if left untreated and once cirrhosis is established there is a 2-7% risk per year of liver cancer and liver failure.  I discussed the importance of treatment and benefits in reducing the risk, even if significant liver fibrosis exists. I also discussed risk for re-infection following treatment should he not continue to modify risk factors.    Patient counseled extensively on limiting acetaminophen to no more than 2 grams daily, avoidance of alcohol.  Transmission discussed with patient including sexual transmission, sharing razors and toothbrush.   Will need referral to gastroenterology if concern for cirrhosis  No identified need for ETOH/SA counseling or treatment   Will prescribe appropriate medication based on genotype and coverage   Hepatitis A and B titers to be drawn today with appropriate vaccinations as needed   Pneumovax vaccine at upcoming visit if not previously given  Further work up to include liver staging with elastography  She will return for an appointment in 2-3 weeks to review results and discuss plan for treatment once liver ultrasound/elastography is obtained.        Relevant Orders   US ABDOMEN COMPLETE W/ELASTOGRAPHY   INR/PT   CBC with Differential/Platelet   Basic metabolic panel   Hepatitis A antibody, total   Hepatitis B surface antibody   Hepatitis B surface antigen   Hepatitis B Core Antibody, total   Liver Fibrosis, FibroTest-ActiTest     Janene Madeira, MSN, NP-C [facility] for Infectious Disease [facility] Office: [phone] Pager: [phone]  04/27/17 4:34 PM

## 2017-04-27 NOTE — Assessment & Plan Note (Signed)
New Patient with Chronic Hepatitis C genotype 1a, treatment naive.   I discussed with the patient the lab findings that confirm chronic hepatitis C as well as the natural history and progression of disease including about 30% of people who develop cirrhosis of the liver if left untreated and once cirrhosis is established there is a 2-7% risk per year of liver cancer and liver failure.  I discussed the importance of treatment and benefits in reducing the risk, even if significant liver fibrosis exists. I also discussed risk for re-infection following treatment should he not continue to modify risk factors.    Patient counseled extensively on limiting acetaminophen to no more than 2 grams daily, avoidance of alcohol.  Transmission discussed with patient including sexual transmission, sharing razors and toothbrush.   Will need referral to gastroenterology if concern for cirrhosis  No identified need for ETOH/SA counseling or treatment   Will prescribe appropriate medication based on genotype and coverage   Hepatitis A and B titers to be drawn today with appropriate vaccinations as needed   Pneumovax vaccine at upcoming visit if not previously given  Further work up to include liver staging with elastography  She will return for an appointment in 2-3 weeks to review results and discuss plan for treatment once liver ultrasound/elastography is obtained.

## 2017-04-27 NOTE — Patient Instructions (Addendum)
Hepatitis C can be cured with 8 - 12 weeks of medications. There can be risk for re-infection in the future if risk factors.   You will always test positive for the Hep C Antibody (memory stamp of infection) but your Hep C RNA will be negative after treatment.   Next Steps for Treatment: ultrasound, a little more blood work and will have you return in 2 weeks to review everything and get you started on your medications.   Hepatitis C Hepatitis C is a viral infection of the liver. It can lead to scarring of the liver (cirrhosis), liver failure, or liver cancer. Hepatitis C may go undetected for months or years because people with the infection may not have symptoms, or they may have only mild symptoms. What are the causes? This condition is caused by the hepatitis C virus (HCV). The virus can spread from person to person (is contagious) through:  Blood.  Childbirth. A woman who has hepatitis C can pass it to her baby during birth.  Bodily fluids, such as breast milk, tears, semen, vaginal fluids, and saliva.  Blood transfusions or organ transplants done in the [place] before 1992.  What increases the risk? The following factors may make you more likely to develop this condition:  Having contact with unclean (contaminated) needles or syringes. This may result from: ? Acupuncture. ? Tattoing. ? Body piercing. ? Injecting drugs.  Having unprotected sex with someone who is infected.  Needing treatment to filter your blood (kidney dialysis).  Having HIV (human immunodeficiency virus) or AIDS (acquired immunodeficiency syndrome).  Working in a job that involves contact with blood or bodily fluids, such as health care.  What are the signs or symptoms? Symptoms of this condition include:  Fatigue.  Loss of appetite.  Nausea.  Vomiting.  Abdominal pain.  Dark yellow urine.  Yellowish skin and eyes (jaundice).  Itchy skin.  Clay-colored bowel movements.  Joint  pain.  Bleeding and bruising easily.  Fluid building up in your stomach (ascites).  In some cases, you may not have any symptoms. How is this diagnosed? This condition is diagnosed with:  Blood tests.  Other tests to check how well your liver is functioning. They may include: ? Magnetic resonance elastography (MRE). This imaging test uses MRIs and sound waves to measure liver stiffness. ? Transient elastography. This imaging test uses ultrasounds to measure liver stiffness. ? Liver biopsy. This test requires taking a small tissue sample from your liver to examine it under a microscope.  How is this treated? Your health care provider may perform noninvasive tests or a liver biopsy to help decide the best course of treatment. Treatment may include:  Antiviral medicines and other medicines.  Follow-up treatments every 6-12 months for infections or other liver conditions.  Receiving a donated liver (liver transplant).  Follow these instructions at home: Medicines  Take over-the-counter and prescription medicines only as told by your health care provider.  Take your antiviral medicine as told by your health care provider. Do not stop taking the antiviral even if you start to feel better.  Do not take any medicines unless approved by your health care provider, including over-the-counter medicines and birth control pills. Activity  Rest as needed.  Do not have sex unless approved by your health care provider.  Ask your health care provider when you may return to school or work. Eating and drinking  Eat a balanced diet with plenty of fruits and vegetables, whole grains, and lowfat (lean)  meats or non-meat proteins (such as beans or tofu).  Drink enough fluids to keep your urine clear or pale yellow.  Do not drink alcohol. General instructions  Do not share toothbrushes, nail clippers, or razors.  Wash your hands frequently with soap and water. If soap and water are not  available, use hand sanitizer.  Cover any cuts or open sores on your skin to prevent spreading the virus.  Keep all follow-up visits as told by your health care provider. This is important. You may need follow-up visits every 6-12 months. How is this prevented? There is no vaccine for hepatitis C. The only way to prevent the disease is to reduce the risk of exposure to the virus. Make sure you:  Wash your hands frequently with soap and water. If soap and water are not available, use hand sanitizer.  Do not share needles or syringes.  Practice safe sex and use condoms.  Avoid handling blood or bodily fluids without gloves or other protection.  Avoid getting tattoos or piercings in shops or other locations that are not clean.  Contact a health care provider if:  You have a fever.  You develop abdominal pain.  You pass dark urine.  You pass clay-colored stools.  You develop joint pain. Get help right away if:  You have increasing fatigue or weakness.  You lose your appetite.  You cannot eat or drink without vomiting.  You develop jaundice or your jaundice gets worse.  You bruise or bleed easily. Summary  Hepatitis C is a viral infection of the liver. It can lead to scarring of the liver (cirrhosis), liver failure, or liver cancer.  The hepatitis C virus (HCV) causes this condition. The virus can pass from person to person (is contagious).  You should not take any medicines unless approved by your health care provider. This includes over-the-counter medicines and birth control pills. This information is not intended to replace advice given to you by your health care provider. Make sure you discuss any questions you have with your health care provider. Document Released: 12/31/1999 Document Revised: 02/08/2016 Document Reviewed: 02/08/2016 Elsevier Interactive Patient Education  2018 [employer].  Hepatitis C Test Hepatitis C is a liver infection caused by the  hepatitis C virus (HCV). Hepatitis C is usually diagnosed with two blood tests. One test checks for antibodies to the virus in your blood. Antibodies are proteins that your body makes to fight infections. If you have antibodies to HCV, it means you have been infected. It does not mean you are still infected. An HCV infection may not cause any symptoms, and you may be able to get rid of the virus without treatment. If you have antibodies to HCV, you will need to have another test to find out if you are still infected. This test is called the HCV RNA qualitative test. It looks for genetic material from HCV in your blood. If you are diagnosed with an active HCV infection, you may also have an HCV RNA quantitative test to measure the amount of virus in your blood (viral load). Your health care provider may repeat this test in order to monitor you during treatment. You may also have an HCV genotype test to identify the kind (genotype) of virus you have. This helps your health care provider determine the best treatment for you. You may be tested if you show symptoms of HCV infection. It is important to be tested because hepatitis C can lead to serious liver damage if not  treated. All HCV tests require a blood sample taken from a vein in your hand or arm. What do the results mean? It is your responsibility to obtain your test results. Ask the lab or department performing the test when and how you will get your results. Talk to your health care provider if you have any questions about your test results. Results of both the HCV antibody test and the HCV RNA test will be either positive or negative. Meaning of Negative Test Results  If your HCV antibody test is negative, it may mean that you have not been infected with HCV. However, it can take a few months for the antibodies to build up in your blood. If it is possible you may have been infected recently, you may need to repeat the test.  If your HCV RNA  qualitative test is negative, this means it is unlikely you have an active HCV infection. Meaning of Positive Test Results  If your HCV antibody test is positive, it is likely that you are infected or have been infected with HCV.  If your HCV RNA qualitative test is also positive, it confirms you have an active HCV infection. Talk with your health care provider to discuss your results, treatment options, and if necessary, the need for more tests. Talk with your health care provider if you have any questions about your results. This information is not intended to replace advice given to you by your health care provider. Make sure you discuss any questions you have with your health care provider. Document Released: 02/05/2004 Document Revised: 09/08/2015 Document Reviewed: 04/07/2013 Elsevier Interactive Patient Education  [employer].

## 2017-04-29 LAB — HEPATITIS B SURFACE ANTIGEN: HEP B S AG: NONREACTIVE

## 2017-04-29 LAB — BASIC METABOLIC PANEL
BUN: 13 mg/dL (ref 7–25)
CO2: 27 mmol/L (ref 20–32)
CREATININE: 0.54 mg/dL (ref 0.50–0.99)
Calcium: 8.7 mg/dL (ref 8.6–10.4)
Chloride: 108 mmol/L (ref 98–110)
GLUCOSE: 97 mg/dL (ref 65–99)
Potassium: 4.3 mmol/L (ref 3.5–5.3)
Sodium: 139 mmol/L (ref 135–146)

## 2017-04-29 LAB — PROTIME-INR
INR: 1.2 — AB
PROTHROMBIN TIME: 12.2 s — AB (ref 9.0–11.5)

## 2017-04-29 LAB — HEPATITIS B SURFACE ANTIBODY,QUALITATIVE: Hep B S Ab: NONREACTIVE

## 2017-04-29 LAB — HEPATITIS A ANTIBODY, TOTAL: HEPATITIS A AB,TOTAL: NONREACTIVE

## 2017-04-29 LAB — HEPATITIS B CORE ANTIBODY, TOTAL: HEP B C TOTAL AB: NONREACTIVE

## 2017-05-01 LAB — LIVER FIBROSIS, FIBROTEST-ACTITEST
ALPHA-2-MACROGLOBULIN: 361 mg/dL — AB (ref 106–279)
ALT: 54 U/L — ABNORMAL HIGH (ref 6–29)
Apolipoprotein A1: 124 mg/dL (ref 101–198)
BILIRUBIN: 0.7 mg/dL (ref 0.2–1.2)
Fibrosis Score: 0.81
GGT: 78 U/L — ABNORMAL HIGH (ref 3–65)
HAPTOGLOBIN: 45 mg/dL (ref 43–212)
Necroinflammat ACT Score: 0.5
REFERENCE ID: 2423782

## 2017-05-04 ENCOUNTER — Encounter (HOSPITAL_COMMUNITY): Payer: Self-pay

## 2017-05-04 ENCOUNTER — Ambulatory Visit (HOSPITAL_COMMUNITY): Admission: RE | Admit: 2017-05-04 | Payer: PRIVATE HEALTH INSURANCE | Source: Ambulatory Visit

## 2017-05-04 ENCOUNTER — Ambulatory Visit (HOSPITAL_COMMUNITY)
Admission: RE | Admit: 2017-05-04 | Discharge: 2017-05-04 | Disposition: A | Discharge status: Home / Self Care | Payer: PRIVATE HEALTH INSURANCE | Source: Ambulatory Visit | Attending: Infectious Diseases | Admitting: Infectious Diseases

## 2017-05-04 ENCOUNTER — Ambulatory Visit (HOSPITAL_COMMUNITY): Payer: PRIVATE HEALTH INSURANCE

## 2017-05-04 ENCOUNTER — Other Ambulatory Visit: Payer: Self-pay | Admitting: Infectious Diseases

## 2017-05-04 DIAGNOSIS — K8020 Calculus of gallbladder without cholecystitis without obstruction: Secondary | ICD-10-CM | POA: Insufficient documentation

## 2017-05-04 DIAGNOSIS — K769 Liver disease, unspecified: Secondary | ICD-10-CM | POA: Diagnosis not present

## 2017-05-04 DIAGNOSIS — B182 Chronic viral hepatitis C: Secondary | ICD-10-CM | POA: Insufficient documentation

## 2017-05-17 ENCOUNTER — Ambulatory Visit (INDEPENDENT_AMBULATORY_CARE_PROVIDER_SITE_OTHER): Payer: PRIVATE HEALTH INSURANCE | Admitting: Infectious Diseases

## 2017-05-17 ENCOUNTER — Ambulatory Visit (INDEPENDENT_AMBULATORY_CARE_PROVIDER_SITE_OTHER): Payer: PRIVATE HEALTH INSURANCE | Admitting: Pharmacist

## 2017-05-17 ENCOUNTER — Encounter: Payer: Self-pay | Admitting: Infectious Diseases

## 2017-05-17 VITALS — BP 122/82 | HR 69 | Temp 97.5°F | Wt 197.0 lb

## 2017-05-17 DIAGNOSIS — K7400 Hepatic fibrosis, unspecified: Secondary | ICD-10-CM | POA: Insufficient documentation

## 2017-05-17 DIAGNOSIS — B182 Chronic viral hepatitis C: Secondary | ICD-10-CM

## 2017-05-17 DIAGNOSIS — K7460 Unspecified cirrhosis of liver: Secondary | ICD-10-CM | POA: Diagnosis not present

## 2017-05-17 MED ORDER — SOFOSBUVIR-VELPATASVIR 400-100 MG PO TABS: 1.0000 | ORAL_TABLET | Freq: Every day | ORAL | 2 refills | Status: DC

## 2017-05-17 NOTE — Patient Instructions (Addendum)
Please sign up with MyChart to access your labs and set up email communication with our clinic for non-urgent medical concerns.   We gave you your first Hep A and Hep B vaccines today.   We would like to have you seen by a liver doctor to help with ongoing care and any monitoring with the nodularity and fibrosis that we found on your ultrasound.   Please start your medication once you get it. Follow up with pharmacy clinic in 4 weeks - we will do your second hep b vaccine and pneumovax at that time.

## 2017-05-17 NOTE — Assessment & Plan Note (Signed)
Genotype 1a treatment naive, F4. Cirrhotic non-decompensated.  Will start 12 weeks of Epclusa considering ultrasound findings. Proceeding with coverage assistance for under-insurance.  She spent time talking with our pharmacy team today regarding treatment course and adherence.  She will return in 4 weeks with pharmacy to recheck RNA and administer Hep B #2 and Pneumovax.

## 2017-05-17 NOTE — Progress Notes (Signed)
HPI: Adamary Savary is a 62 y.o. female who presents to the [facility] clinic to follow-up with Colletta Maryland for her Hep C infection. She has genotype 1a, F4 with CP class A, and a baseline Hep C viral load of 2.4 million.  Patient Active Problem List   Diagnosis Date Noted  . Chronic viral hepatitis C ([place]) 04/27/2017  . Cigarette smoker 02/10/2017    Patient's Medications  New Prescriptions   SOFOSBUVIR-VELPATASVIR (EPCLUSA) 400-100 MG TABS    Take 1 tablet by mouth daily.  Previous Medications   ACETAMINOPHEN (TYLENOL) 500 MG TABLET    Take 1,000 mg by mouth every 6 (six) hours as needed.   CYCLOBENZAPRINE (FLEXERIL) 10 MG TABLET    Take 0.5-1 tablets (5-10 mg total) by mouth 3 (three) times daily as needed.   TRAMADOL (ULTRAM) 50 MG TABLET    Take 1 tablet (50 mg total) by mouth every 8 (eight) hours.  Modified Medications   No medications on file  Discontinued Medications   No medications on file    Allergies: Allergies  Allergen Reactions  . Naproxen Other (See Comments)    Pt states she does not remember her reaction    Past Medical History: Past Medical History:  Diagnosis Date  . Allergic rhinitis   . Chronic viral hepatitis C ([place]) 04/27/2017  . Depression    in the past, no meds, no previous hospitalization   . Heart murmur     Social History: Social History   Socioeconomic History  . Marital status: Married    Spouse name: Not on file  . Number of children: Not on file  . Years of education: Not on file  . Highest education level: Not on file  Occupational History  . Occupation: Geophysicist/field seismologist, Hydrologist  Social Needs  . Financial resource strain: Not on file  . Food insecurity:    Worry: Not on file    Inability: Not on file  . Transportation needs:    Medical: Not on file    Non-medical: Not on file  Tobacco Use  . Smoking status: Current Every Day Smoker    Packs/day: 1.00    Years: 25.00    Pack years: 25.00    Types: Cigarettes  . Smokeless  tobacco: Never Used  Substance and Sexual Activity  . Alcohol use: Yes    Comment: occassional 2 beers a month  . Drug use: No  . Sexual activity: Not Currently  Lifestyle  . Physical activity:    Days per week: Not on file    Minutes per session: Not on file  . Stress: Not on file  Relationships  . Social connections:    Talks on phone: Not on file    Gets together: Not on file    Attends religious service: Not on file    Active member of club or organization: Not on file    Attends meetings of clubs or organizations: Not on file    Relationship status: Not on file  Other Topics Concern  . Not on file  Social History Narrative  . Not on file    Labs: Hepatitis C Lab Results  Component Value Date   HCVGENOTYPE Comment 03/12/2017   FIBROSTAGE F4 04/27/2017   Hepatitis B Lab Results  Component Value Date   HEPBSAB NON-REACTIVE 04/27/2017   HEPBSAG NON-REACTIVE 04/27/2017   HEPBCAB NON-REACTIVE 04/27/2017   Hepatitis A Lab Results  Component Value Date   HAV NON-REACTIVE 04/27/2017   HIV Lab Results  Component Value Date   HIV Non Reactive 03/12/2017   Lab Results  Component Value Date   CREATININE 0.54 04/27/2017   CREATININE 0.81 11/11/2011   Lab Results  Component Value Date   AST 83 (H) 03/12/2017   AST 66 (H) 11/11/2011   ALT 54 (H) 04/27/2017   ALT 41 (H) 03/12/2017   ALT 63 (H) 11/11/2011   INR 1.2 (H) 04/27/2017    Fibrosis Score: F4 as assessed by [facility]   Child-Pugh Score: A  Assessment: Chellie is here today to follow-up with Colletta Maryland regarding her Hep C infection.  She has all of her baseline labs back and is ready to begin treatment with Epclusa x 12 weeks.  I spent time going over [place] with her and counseled her on how to take it including at the same time each day with no missed doses.  Emphasized the importance of adherence to make sure she has the best possible chance of cure.  Counseled on possible side effects such as headache,  nausea, and fatigue. She has insurance but states it is the bare minimum and only will cover up to $600 per year for medical bills.  Since she is technically underinsured, Inez Catalina will apply through [place] patient assistance and get her medication assistance.  Once she sends back income documentation, Inez Catalina will send in the application.  Once she starts, she will see me back in ~3-4 weeks.  She will need her 2nd Hep B vaccine and pneumovax at that time.  Plan: - Apply for Epclusa x 12 weeks - 2nd Hep B and pneumovax when seen again  Cassie L. Kuppelweiser, PharmD, [facility] 05/17/2017, 1:53 PM

## 2017-05-17 NOTE — Assessment & Plan Note (Signed)
F4 Metavir score with nodularity and likely cirrhosis on her abdominal ultrasound study. We discussed these results today and for hopes of some reversal of inflammation with treating her hepatitis c infection. Will proceed with GI referral for ongoing management and [place] screening.

## 2017-05-17 NOTE — Progress Notes (Signed)
[facility]   CC:  Chronic Hepatitis C  Brief Narrative:  Renee Rice is a 62 y.o. female who presents for management of chronic hepatitis C. Patient tested positive with Hep C RNA 2,490,000 on 03/15/2017. Hepatitis C-associated risk factors present are: prior IV drug use 'a few times', years of sexual contact with Hep C (+) husband, whom was treated with [facility] 3 years ago. Patient denies accidental needle stick, history of blood transfusion, multiple sexual partners, renal dialysis, tattoos. Patient has not had other studies performed. Patient has not had prior treatment for Hepatitis C. Patient does not have a past history of liver disease. Patient does not have a family history of liver disease. Patient does not  have associated signs or symptoms related to liver disease.   HPI:  Renee Rice is here today to review her lab work and ultrasound results. Interval history without update to her medical record / current condition since last visit.   Review of Systems:  Constitutional: negative Eyes: negative, no scleral icterus  Respiratory: negative Cardiovascular: negative Gastrointestinal: negative except for rash on stomach she believes is psoriasis Genitourinary: negative Musculoskeletal: negative Neurological: negative Behavioral/Psych: negative All other systems reviewed and are negative     Past Medical History:  Diagnosis Date  . Allergic rhinitis   . Chronic viral hepatitis C ([place]) 04/27/2017  . Depression    in the past, no meds, no previous hospitalization   . Heart murmur    Allergies  Allergen Reactions  . Naproxen Other (See Comments)    Pt states she does not remember her reaction    Social History   Tobacco Use  . Smoking status: Current Every Day Smoker    Packs/day: 1.00    Years: 25.00    Pack years: 25.00    Types: Cigarettes  . Smokeless tobacco: Never Used  Substance Use Topics  . Alcohol use: Yes    Comment:  occassional 2 beers a month  . Drug use: No    Family History  Problem Relation Age of Onset  . Migraines Mother   . Migraines Maternal Grandmother     Objective:   Vitals:   05/17/17 1040  BP: 122/82  Pulse: 69  Temp: (!) 97.5 F (36.4 C)  Constitutional: in no apparent distress, well developed and well nourished, alert and oriented times 3. Seated comfortably in chair  Eyes: anicteric Cardiovascular: Cor RRR and No murmurs Respiratory: clear Gastrointestinal: Bowel sounds are normal, liver is not enlarged, spleen is not enlarged Musculoskeletal: peripheral pulses normal, no pedal edema, no clubbing or cyanosis Skin: negative for - jaundice, spider hemangioma, telangiectasia, palmar erythema, ecchymosis and atrophy; no porphyria cutanea tarda. Salmon/pink colored rash encircling her umbilicus with silvery scales; similar patch with more scales on left parietal skull.  Lymphatic: no cervical lymphadenopathy  Laboratory Genotype: 1a  HCV viral load: 2,400,000   Lab Results  Component Value Date   WBC 7.7 11/11/2011   HGB 16.0 11/11/2011   HCT 51.5 (A) 11/11/2011   MCV 94.2 11/11/2011    Lab Results  Component Value Date   CREATININE 0.54 04/27/2017   BUN 13 04/27/2017   NA 139 04/27/2017   K 4.3 04/27/2017   CL 108 04/27/2017   CO2 27 04/27/2017    Lab Results  Component Value Date   ALT 54 (H) 04/27/2017   AST 83 (H) 03/12/2017   ALKPHOS 271 (H) 03/12/2017    Lab Results  Component Value Date  INR 1.2 (H) 04/27/2017   BILITOT 0.4 03/12/2017   ALBUMIN 3.2 (L) 03/12/2017    Labs and history reviewed and show CHILD-PUGH A / 6  5-6 points: Child class A 7-9 points: Child class B 10-15 points: Child class C  Assessment & Plan:  Problem List Items Addressed This Visit      Digestive   Chronic viral hepatitis C ([place])    Genotype 1a treatment naive, F4. Cirrhotic non-decompensated.  Will start 12 weeks of Epclusa considering ultrasound findings.  Proceeding with coverage assistance for under-insurance.  She spent time talking with our pharmacy team today regarding treatment course and adherence.  She will return in 4 weeks with pharmacy to recheck RNA and administer Hep B #2 and Pneumovax.       Fibrosis of liver    F4 Metavir score with nodularity and likely cirrhosis on her abdominal ultrasound study. We discussed these results today and for hopes of some reversal of inflammation with treating her hepatitis c infection. Will proceed with GI referral for ongoing management and [place] screening.        Other Visit Diagnoses    Cirrhosis of liver without ascites, unspecified hepatic cirrhosis type ([place])    -  Primary   Relevant Orders   Ambulatory referral to Gastroenterology     I spent 15 minutes with the patient including greater than 50% of time in face to face counsel of the patient re Hep C, Ultrasound results, Treatment course/side effects/adherence, need for GI involvement and in coordination of their care.  Janene Madeira, MSN, NP-C [facility] Office: [phone] Pager: [phone]  05/17/17 4:47 PM

## 2017-05-18 ENCOUNTER — Encounter: Payer: Self-pay | Admitting: Gastroenterology

## 2017-05-29 ENCOUNTER — Other Ambulatory Visit: Payer: Self-pay | Admitting: Pharmacist

## 2017-05-29 DIAGNOSIS — B182 Chronic viral hepatitis C: Secondary | ICD-10-CM

## 2017-05-29 MED ORDER — SOFOSBUVIR-VELPATASVIR 400-100 MG PO TABS: 1.0000 | ORAL_TABLET | Freq: Every day | ORAL | 2 refills | Status: DC

## 2017-06-07 ENCOUNTER — Telehealth: Payer: Self-pay | Admitting: Pharmacy Technician

## 2017-06-07 NOTE — Telephone Encounter (Signed)
Must work with her insurance to meet $600 yearly max out of pocket before [employer] will cover Epclusa.  She states she will call her insurance tomorrow and offer to pay them the needed $600 max so that she can get patient assistance through [employer].  She said she will call me back with an update.

## 2017-06-18 ENCOUNTER — Telehealth: Payer: Self-pay

## 2017-06-18 ENCOUNTER — Ambulatory Visit: Payer: PRIVATE HEALTH INSURANCE | Admitting: Infectious Diseases

## 2017-06-18 NOTE — Telephone Encounter (Signed)
Called pt per Janene Madeira, NP about appt tomorrow 06/19/17. Pt has not been approved yet for medication and does not have to be seen at the office yet. Someone will call her once the medication is approved with a f/u appt. Pt can still come to scheduled appt if she would like for vaccines. Left a vm for pt to call back regarding this message. [place]

## 2017-06-19 ENCOUNTER — Ambulatory Visit (INDEPENDENT_AMBULATORY_CARE_PROVIDER_SITE_OTHER): Payer: PRIVATE HEALTH INSURANCE | Admitting: Infectious Diseases

## 2017-06-19 DIAGNOSIS — Z23 Encounter for immunization: Secondary | ICD-10-CM | POA: Diagnosis not present

## 2017-06-19 DIAGNOSIS — B182 Chronic viral hepatitis C: Secondary | ICD-10-CM | POA: Diagnosis not present

## 2017-06-19 NOTE — Progress Notes (Signed)
Patient is not on medications yet due to difficulties with insurance.   Vaccines provided today and will have her return once medications are accessible.   Janene Madeira, MSN, NP-C [facility] for Infectious [place] Office: [phone] Pager: [phone]  06/19/17 5:29 PM

## 2017-07-04 ENCOUNTER — Encounter: Payer: Self-pay | Admitting: Gastroenterology

## 2017-07-04 ENCOUNTER — Ambulatory Visit (INDEPENDENT_AMBULATORY_CARE_PROVIDER_SITE_OTHER): Payer: PRIVATE HEALTH INSURANCE | Admitting: Gastroenterology

## 2017-07-04 ENCOUNTER — Other Ambulatory Visit (INDEPENDENT_AMBULATORY_CARE_PROVIDER_SITE_OTHER): Payer: PRIVATE HEALTH INSURANCE

## 2017-07-04 VITALS — BP 120/76 | HR 72 | Ht 71.0 in | Wt 188.0 lb

## 2017-07-04 DIAGNOSIS — K7469 Other cirrhosis of liver: Secondary | ICD-10-CM | POA: Diagnosis not present

## 2017-07-04 DIAGNOSIS — B182 Chronic viral hepatitis C: Secondary | ICD-10-CM | POA: Diagnosis not present

## 2017-07-04 DIAGNOSIS — R609 Edema, unspecified: Secondary | ICD-10-CM

## 2017-07-04 DIAGNOSIS — R161 Splenomegaly, not elsewhere classified: Secondary | ICD-10-CM | POA: Diagnosis not present

## 2017-07-04 LAB — CBC WITH DIFFERENTIAL/PLATELET
BASOS ABS: 0 10*3/uL (ref 0.0–0.1)
Basophils Relative: 0.9 % (ref 0.0–3.0)
EOS ABS: 0.2 10*3/uL (ref 0.0–0.7)
Eosinophils Relative: 4.5 % (ref 0.0–5.0)
HEMATOCRIT: 40.1 % (ref 36.0–46.0)
Hemoglobin: 13.3 g/dL (ref 12.0–15.0)
LYMPHS PCT: 35.4 % (ref 12.0–46.0)
Lymphs Abs: 1.6 10*3/uL (ref 0.7–4.0)
MCHC: 33.3 g/dL (ref 30.0–36.0)
MCV: 94.7 fl (ref 78.0–100.0)
MONOS PCT: 9.1 % (ref 3.0–12.0)
Monocytes Absolute: 0.4 10*3/uL (ref 0.1–1.0)
NEUTROS ABS: 2.3 10*3/uL (ref 1.4–7.7)
Neutrophils Relative %: 50.1 % (ref 43.0–77.0)
PLATELETS: 70 10*3/uL — AB (ref 150.0–400.0)
RBC: 4.24 Mil/uL (ref 3.87–5.11)
RDW: 13.8 % (ref 11.5–15.5)
WBC: 4.5 10*3/uL (ref 4.0–10.5)

## 2017-07-04 NOTE — Progress Notes (Signed)
[facility] Consult Note:  History: Renee Rice 07/04/2017  Referring physician: Janene Madeira, NP (Infectious Disease)  Reason for consult/chief complaint: Cirrhosis (No Gi compliants, back pain due to recent injury)   Subjective : cirrhosis HPI:  This is a pleasant 62 year old woman referred by infectious disease clinic for evaluation of cirrhosis. She has chronic hepatitis C, most recent office visit with infectious disease on June 4.  They had previously planned for antiviral therapy, but as of the most recent visit that had not yet started due to reported difficulties with insurance.  She is being vaccinated for hepatitis B. Renee Rice was diagnosed with hepatitis C earlier this year, but does not know how long she had it.  Her husband was infected with hepatitis C and then cleared with therapy.  She also used IV drugs decades ago.  She has had no previous GI bleeding.  The last couple of months she is bothered by worsening edema in the feet and lower legs as well as "spots" that are come up on her chest wall neck and arms.  ROS:  Review of Systems  Constitutional: Negative for appetite change and unexpected weight change.  HENT: Negative for mouth sores and voice change.   Eyes: Negative for pain and redness.  Respiratory: Negative for cough and shortness of breath.   Cardiovascular: Negative for chest pain and palpitations.  Genitourinary: Negative for dysuria and hematuria.  Musculoskeletal: Positive for back pain. Negative for arthralgias and myalgias.  Skin: Negative for pallor and rash.  Neurological: Negative for weakness and headaches.  Hematological: Negative for adenopathy.     Past Medical History: Past Medical History:  Diagnosis Date  . Allergic rhinitis   . Chronic viral hepatitis C ([place]) 04/27/2017  . Depression    in the past, no meds, no previous hospitalization   . Heart murmur      Past Surgical History: No past surgical  history on file.   Family History: Family History  Problem Relation Age of Onset  . Migraines Mother   . Migraines Maternal Grandmother     Social History: Social History   Socioeconomic History  . Marital status: Married    Spouse name: Not on file  . Number of children: Not on file  . Years of education: Not on file  . Highest education level: Not on file  Occupational History  . Occupation: Geophysicist/field seismologist, Hydrologist  Social Needs  . Financial resource strain: Not on file  . Food insecurity:    Worry: Not on file    Inability: Not on file  . Transportation needs:    Medical: Not on file    Non-medical: Not on file  Tobacco Use  . Smoking status: Current Every Day Smoker    Packs/day: 1.00    Years: 25.00    Pack years: 25.00    Types: Cigarettes  . Smokeless tobacco: Never Used  Substance and Sexual Activity  . Alcohol use: Yes    Comment: occassional 2 beers a month  . Drug use: No  . Sexual activity: Not Currently  Lifestyle  . Physical activity:    Days per week: Not on file    Minutes per session: Not on file  . Stress: Not on file  Relationships  . Social connections:    Talks on phone: Not on file    Gets together: Not on file    Attends religious service: Not on file    Active member of club or organization: Not  on file    Attends meetings of clubs or organizations: Not on file    Relationship status: Not on file  Other Topics Concern  . Not on file  Social History Narrative  . Not on file    Allergies: Allergies  Allergen Reactions  . Naproxen Other (See Comments)    Pt states she does not remember her reaction    Outpatient Meds: Current Outpatient Medications  Medication Sig Dispense Refill  . acetaminophen (TYLENOL) 500 MG tablet Take 1,000 mg by mouth every 6 (six) hours as needed.    . traMADol (ULTRAM) 50 MG tablet Take 1 tablet (50 mg total) by mouth every 8 (eight) hours. 63 tablet 0   No current facility-administered medications  for this visit.       ___________________________________________________________________ Objective   Exam:  BP 120/76   Pulse 72   Ht 5\' 11"  (1.803 m)   Wt 188 lb (85.3 kg)   BMI 26.22 kg/m   Not acutely ill-appearing  Eyes: sclera anicteric, no redness  ENT: oral mucosa moist without lesions, no cervical or supraclavicular lymphadenopathy, dentures  CV: RRR without murmur, S1/S2, no JVD, pitting edema to mid calf bilaterally, left slightly greater than right  Resp: clear to auscultation bilaterally, normal RR and effort noted  GI: soft, no tenderness, with active bowel sounds.  Left lobe liver enlarged, no spleen tip palpable.  No bulging flanks, distention or caput medusae  Skin; warm and dry, no rash or jaundice noted.  Multiple spider nevi upper chest wall, neck and upper right arm.  Neuro: awake, alert and oriented x 3. Normal gross motor function and fluent speech  Labs:  CMP Latest Ref Rng & Units 04/27/2017 03/12/2017 11/11/2011  Glucose 65 - 99 mg/dL 97 - 89  BUN 7 - 25 mg/dL 13 - 17  Creatinine 0.50 - 0.99 mg/dL 0.54 - 0.81  Sodium 135 - 146 mmol/L 139 - 141  Potassium 3.5 - 5.3 mmol/L 4.3 - 5.4(H)  Chloride 98 - 110 mmol/L 108 - 105  CO2 20 - 32 mmol/L 27 - 29  Calcium 8.6 - 10.4 mg/dL 8.7 - 9.8  Total Protein 6.0 - 8.5 g/dL - 7.0 7.5  Total Bilirubin 0.0 - 1.2 mg/dL - 0.4 0.8  Alkaline Phos 39 - 117 IU/L - 271(H) 185(H)  AST 0 - 40 IU/L - 83(H) 66(H)  ALT 6 - 29 U/L 54(H) 41(H) 63(H)   Recent prothrombin time 12.2, INR 1.2  No recent CBC Oddly, last CBC from October 2013 did not include a platelet count.  Hepatitis C, genotype 1a, viral load 2.5 million  Radiologic Studies:  Hepatic ultrasound 05/04/2017: Nodular contours of liver consistent with cirrhosis, F3/F4 fibrosis score.  Multiple gallstones, largest 1.2 cm.  CBD 5 mm.  No liver mass noted.  Assessment: Encounter Diagnoses  Name Primary?  . Other cirrhosis of liver ([place]) Yes  .  Peripheral edema   . Splenomegaly   . Chronic hepatitis C without hepatic coma (HCC)     Renee Rice has compensated cirrhosis from chronic hepatitis C infection that has not yet been treated.  She has mild peripheral edema and otherwise no complications of chronic liver disease.  She reports inability to get medical coverage or insurance approval for her hepatitis C therapy, which she is anxious to begin.  For some reason, she is therefore decided to cancel her insurance as of July 1.  She believes this means she will then qualify for medicine through the drug  companies program.  She does not yet clear what her plans are for obtaining medical coverage afterwards, but says she plans to do so. We had a long discussion about cirrhosis and its various potential complications.  She understands there is an increased risk of liver disease, requiring screening every 6 months.  I explained the potential for worsening peripheral edema and possibly fluid retention in the abdomen.  Currently, I recommend compression stockings rather than starting diuretics.  I also recommended 2000 mg daily sodium restriction.  We discussed the possibility of esophageal gastric varices, and that she needs an upper endoscopy for screening.  I will not be able to do that before the end of the month.    Plan: CBC and alpha-fetoprotein today Recall for clinic visit in 4 months.  Hopefully, she will have coverage at that point, thereby allowing her to undergo screening upper endoscopy.  She will contact us sooner if that happens and she is able to pursue that testing. She is being vaccinated for hepatitis B, needs an annual flu shot, and should avoid eating sushi due to the risk of a particular vibrio infection that could be contracted.  Thank you for the courtesy of this consult.  Please call me with any questions or concerns.  Nelida Meuse III  CC: Tereasa Coop, PA-C  Renee Madeira, NP

## 2017-07-04 NOTE — Patient Instructions (Signed)
If you are age 61 or older, your body mass index should be between 23-30. Your Body mass index is 26.22 kg/m. If this is out of the aforementioned range listed, please consider follow up with your Primary Care Provider.  If you are age 53 or younger, your body mass index should be between 19-25. Your Body mass index is 26.22 kg/m. If this is out of the aformentioned range listed, please consider follow up with your Primary Care Provider.   Your provider has requested that you go to the basement level for lab work before leaving today. Press "B" on the elevator. The lab is located at the first door on the left as you exit the elevator.  It was a pleasure to meet you today!  Dr. Loletha Carrow

## 2017-07-05 ENCOUNTER — Telehealth: Payer: Self-pay

## 2017-07-05 ENCOUNTER — Other Ambulatory Visit: Payer: Self-pay

## 2017-07-05 DIAGNOSIS — R772 Abnormality of alphafetoprotein: Secondary | ICD-10-CM

## 2017-07-05 DIAGNOSIS — K7469 Other cirrhosis of liver: Secondary | ICD-10-CM

## 2017-07-05 LAB — AFP TUMOR MARKER: AFP-Tumor Marker: 27.8 ng/mL — ABNORMAL HIGH

## 2017-07-05 NOTE — Telephone Encounter (Signed)
Spoke to patient aware of CT appointment, will come pick up prep and instructions. Verbally gave her prep instructions.

## 2017-07-06 ENCOUNTER — Ambulatory Visit: Payer: PRIVATE HEALTH INSURANCE | Admitting: Gastroenterology

## 2017-07-13 ENCOUNTER — Encounter (HOSPITAL_COMMUNITY): Payer: Self-pay

## 2017-07-13 ENCOUNTER — Ambulatory Visit (HOSPITAL_COMMUNITY)
Admission: RE | Admit: 2017-07-13 | Discharge: 2017-07-13 | Disposition: A | Discharge status: Home / Self Care | Payer: Self-pay | Source: Ambulatory Visit | Attending: Gastroenterology | Admitting: Gastroenterology

## 2017-07-13 DIAGNOSIS — R772 Abnormality of alphafetoprotein: Secondary | ICD-10-CM

## 2017-07-13 DIAGNOSIS — K7469 Other cirrhosis of liver: Secondary | ICD-10-CM | POA: Insufficient documentation

## 2017-07-13 DIAGNOSIS — R161 Splenomegaly, not elsewhere classified: Secondary | ICD-10-CM | POA: Insufficient documentation

## 2017-07-13 DIAGNOSIS — K8020 Calculus of gallbladder without cholecystitis without obstruction: Secondary | ICD-10-CM | POA: Insufficient documentation

## 2017-07-13 DIAGNOSIS — B1920 Unspecified viral hepatitis C without hepatic coma: Secondary | ICD-10-CM | POA: Insufficient documentation

## 2017-07-13 DIAGNOSIS — M488X6 Other specified spondylopathies, lumbar region: Secondary | ICD-10-CM | POA: Insufficient documentation

## 2017-07-13 LAB — POCT I-STAT CREATININE: CREATININE: 0.6 mg/dL (ref 0.44–1.00)

## 2017-07-13 MED ORDER — IOHEXOL 300 MG/ML  SOLN
100.0000 mL | Freq: Once | INTRAMUSCULAR | Status: AC | PRN
  Administered 2017-07-13: 100 mL via INTRAVENOUS

## 2017-07-16 ENCOUNTER — Telehealth: Payer: Self-pay

## 2017-07-16 NOTE — Telephone Encounter (Signed)
Received call report from radiology for recent CT report.

## 2017-07-17 ENCOUNTER — Other Ambulatory Visit: Payer: Self-pay

## 2017-07-17 DIAGNOSIS — K769 Liver disease, unspecified: Secondary | ICD-10-CM

## 2017-07-23 ENCOUNTER — Telehealth: Payer: Self-pay | Admitting: Pharmacist

## 2017-07-23 NOTE — Telephone Encounter (Signed)
Renee Rice has been having trouble with her insurance and getting coverage for treatment for her Hepatitis C infection. She was in limbo of being underinsured and having a huge co-pay and cancelling her insurance to get approval.  Renee Rice had an ultrasound of her liver back in April that was consistent with cirrhosis but no lesion on her liver.  She saw GI and was complaining of swelling in her legs/feet so they ordered a CT of her liver which showed a 1.9 x 1.4 cm lesion in the right hepatic lobe concerning for [place].  She has a f/u MRI on 7/20.  I talked to Renee Rice today and she was extremely upset. I told her that we will hold off on pursuing treatment for right now until she has her f/u MRI. She would like Renee Rice to call her after her MRI to discuss next steps regarding treatment.  I told her we would follow-up after that time.

## 2017-08-02 ENCOUNTER — Telehealth: Payer: Self-pay | Admitting: Gastroenterology

## 2017-08-02 NOTE — Telephone Encounter (Signed)
Called patient and gave her the number to radiology scheduling to reschedule at a time that is more convenient.

## 2017-08-02 NOTE — Telephone Encounter (Signed)
Pt needs to r/s MRI at [facility] this 04/22/2022 due to a death in her family.

## 2017-08-04 ENCOUNTER — Ambulatory Visit (HOSPITAL_COMMUNITY): Admission: RE | Admit: 2017-08-04 | Payer: Self-pay | Source: Ambulatory Visit

## 2017-08-10 ENCOUNTER — Ambulatory Visit (HOSPITAL_COMMUNITY)
Admission: RE | Admit: 2017-08-10 | Discharge: 2017-08-10 | Disposition: A | Discharge status: Home / Self Care | Payer: Self-pay | Source: Ambulatory Visit | Attending: Gastroenterology | Admitting: Gastroenterology

## 2017-08-10 DIAGNOSIS — K766 Portal hypertension: Secondary | ICD-10-CM | POA: Insufficient documentation

## 2017-08-10 DIAGNOSIS — K769 Liver disease, unspecified: Secondary | ICD-10-CM | POA: Insufficient documentation

## 2017-08-10 DIAGNOSIS — C220 Liver cell carcinoma: Secondary | ICD-10-CM | POA: Insufficient documentation

## 2017-08-10 DIAGNOSIS — K7460 Unspecified cirrhosis of liver: Secondary | ICD-10-CM | POA: Insufficient documentation

## 2017-08-10 MED ORDER — GADOBENATE DIMEGLUMINE 529 MG/ML IV SOLN
20.0000 mL | Freq: Once | INTRAVENOUS | Status: AC | PRN
  Administered 2017-08-10: 18 mL via INTRAVENOUS

## 2017-08-13 NOTE — Telephone Encounter (Signed)
Agree 

## 2017-08-13 NOTE — Telephone Encounter (Signed)
Patient called back - MRI read diagnostic for Renee Rice. Will hold on HCV treatment pending establishing with oncology team as directed by Dr. Loletha Carrow. Would like to get her back to see Dr. Linus Salmons once her [facility] tx plan is better defined for follow up regarding timing of treatment for hep c infection considering her complexity.   Thank you!

## 2017-08-14 ENCOUNTER — Other Ambulatory Visit: Payer: Self-pay

## 2017-08-14 DIAGNOSIS — C229 Malignant neoplasm of liver, not specified as primary or secondary: Secondary | ICD-10-CM

## 2017-08-15 ENCOUNTER — Encounter: Payer: Self-pay | Admitting: Nurse Practitioner

## 2017-08-15 ENCOUNTER — Telehealth: Payer: Self-pay | Admitting: Nurse Practitioner

## 2017-08-15 NOTE — Telephone Encounter (Signed)
New referral received from Dr. Loletha Carrow for liver cancer. Pt has been scheduled to see Cira Rue, NP on 8/9 at 130pm. Pt aware to arrive 30 minutes early. Address verified with the pt. Letter mailed.

## 2017-08-24 ENCOUNTER — Encounter: Payer: Self-pay | Admitting: Nurse Practitioner

## 2017-08-24 ENCOUNTER — Telehealth: Payer: Self-pay | Admitting: Hematology

## 2017-08-24 ENCOUNTER — Inpatient Hospital Stay: Payer: No Typology Code available for payment source | Attending: Nurse Practitioner | Admitting: Nurse Practitioner

## 2017-08-24 VITALS — BP 125/85 | HR 69 | Temp 97.8°F | Resp 18 | Ht 71.0 in | Wt 190.9 lb

## 2017-08-24 DIAGNOSIS — K766 Portal hypertension: Secondary | ICD-10-CM | POA: Diagnosis not present

## 2017-08-24 DIAGNOSIS — C220 Liver cell carcinoma: Secondary | ICD-10-CM | POA: Insufficient documentation

## 2017-08-24 DIAGNOSIS — R609 Edema, unspecified: Secondary | ICD-10-CM | POA: Diagnosis not present

## 2017-08-24 DIAGNOSIS — R05 Cough: Secondary | ICD-10-CM | POA: Diagnosis not present

## 2017-08-24 DIAGNOSIS — K7460 Unspecified cirrhosis of liver: Secondary | ICD-10-CM | POA: Insufficient documentation

## 2017-08-24 DIAGNOSIS — Z801 Family history of malignant neoplasm of trachea, bronchus and lung: Secondary | ICD-10-CM | POA: Insufficient documentation

## 2017-08-24 DIAGNOSIS — B182 Chronic viral hepatitis C: Secondary | ICD-10-CM | POA: Insufficient documentation

## 2017-08-24 DIAGNOSIS — Z720 Tobacco use: Secondary | ICD-10-CM | POA: Diagnosis not present

## 2017-08-24 NOTE — Progress Notes (Signed)
Met with uninsured patient referred by NP with concerns.  Introduced myself as Arboriculturist and advised of what resources are available to her. Advised patient all uninsured patients receive a 55% discount for services billed directly through [facility] and that she may apply for an additional discount through the hospital [place] [facility] but must apply for Medicaid first. Gave patient a Medicaid application and advised to complete and turn into the DSS in her county or she may apply online. She verbalized understanding.  Advised patient once her treatment plan has been established, she may apply for the one-time $400 [place] to help with personal expenses while going through treatment. Also advised, should chemotherapy be a part of her treatment plan, Rob in pharmacy will reach out to her if there are any drugs eligible for replacement or free drug. Gave her Lenise's card whom will be her advocate for any other additional financial questions or concerns. Asked if she has any other financial questions or concerns and she states no and I gave her a place to start. Emphasized at any point not to hesitate coming for her appointments due to being uninsured. Gave her a brochure on [employer] as well which will allow her to combine all of her bills in one and make one monthly payment and referred to billing department for any other billing concerns. She seemed to have a good understanding along with her support system present with her.

## 2017-08-24 NOTE — Progress Notes (Addendum)
[facility]  [phone] Fax:([phone]) [place] Note   Patient Care Team: Hillis Range as PCP - General (Urgent Care) 08/24/2017  CHIEF COMPLAINTS/PURPOSE OF CONSULTATION:  Liver masses    HISTORY OF PRESENTING ILLNESS:  Renee Rice 62 y.o. female with history of psoriasis and chronic untreated Hep C genotype 1a diagnosed in 02/2017 and liver cirrhosis found on screening US in 05/04/17. Hep C treatment on hold for insurance difficulties. Has been vaccinated for Hep B. She was referred to GI for ongoing management. AFP drawn at initial consult was elevated to 27.8; CT abdomen was ordered.  CT on 07/14/2017 showed morphologic changes in the liver consistent with cirrhosis; fibrosis stage F4.  In the posterior right hepatic lobe there is a 1.9 x 1.4 cm arterial phase enhancing lesion with washout on portal venous phase.  In the left hepatic lobe there is a 0.6 cm focal area of hyperenhancement without definitive lesion on portal venous and delayed phase imaging, which could represent perfusion anomaly.  Within the inferior right hepatic lobe there is a focal area of hyperenhancement without definitive lesion on portal venous or delayed phase imaging, which again was felt to be a perfusion anomaly.  No intrahepatic or extrahepatic biliary ductal dilatation.  Sequela of portal venous hypertension including splenomegaly and upper abdominal collaterals were noted. She underwent liver MRI on 08/10/2017 which demonstrated a 1.8 cm mass in segment 7 which shows homogenous hypervascular enhancement, delayed washout, and peripheral rim enhancement as well as a 1.0 cm mass in segment 2 with the same characteristics.  These findings are diagnostic of hepatocellular carcinoma.  In addition there is a 9 mm hypervascular lesion on segment 2, a 9 mm hypervascular lesion in segment 8, and an 8 mm hypervascular lesion in segment 5 which showed no definite delayed washout  or peripheral rim enhancement but are too small to characterize. No abdominal adenopathy. She was referred to Dr. Loletha Carrow for further evaluation and treatment.   She presents with her spouse and mother today. She has no children. She works full time for [employer]. Denies personal history of cancer, she is up to date with colonoscopy per Dr. Collene Mares in 2014, reportedly was normal. Last mammogram in over 5 years. Her biological father was a smoker and had lung cancer metastatic to brain. No other family history of cancer. She is current cigarette smoker x30 years, smokes 2 packs per week. Currently drinks alcohol socially, denies heavy use in the past. She used IV drugs remotely, none in decades.   Today she feels well. Denies abdominal pain. Appetite is normal. She remains active and busy without fatigue. Denies nausea, vomiting, constipation, diarrhea, or GI bleeding. She has intermittent stable productive cough with green phlegm for 2 years. DOE is at baseline. Denies chest pain. No recent fever or chills. She has 2 month history of bilateral leg edema, improves overnight and with compression stockings. Mood is stable.   MEDICAL HISTORY:  Past Medical History:  Diagnosis Date  . Allergic rhinitis   . Chronic viral hepatitis C ([place]) 04/27/2017  . Depression    in the past, no meds, no previous hospitalization   . Heart murmur     SURGICAL HISTORY: History reviewed. No pertinent surgical history.  SOCIAL HISTORY: Social History   Socioeconomic History  . Marital status: Married    Spouse name: Not on file  . Number of children: 0  . Years of education: Not on file  .  Highest education level: Not on file  Occupational History  . Occupation: Geophysicist/field seismologist, Hydrologist    Comment: full time   Social Needs  . Financial resource strain: Not on file  . Food insecurity:    Worry: Not on file    Inability: Not on file  . Transportation needs:    Medical: Not on file    Non-medical: Not on file    Tobacco Use  . Smoking status: Current Every Day Smoker    Packs/day: 1.00    Years: 30.00    Pack years: 30.00    Types: Cigarettes  . Smokeless tobacco: Never Used  Substance and Sexual Activity  . Alcohol use: Yes    Comment: occassional 2 beers a month; never heavy use  . Drug use: No    Comment: remote h/o IV drug use, none in decades   . Sexual activity: Not Currently  Lifestyle  . Physical activity:    Days per week: Not on file    Minutes per session: Not on file  . Stress: Not on file  Relationships  . Social connections:    Talks on phone: Not on file    Gets together: Not on file    Attends religious service: Not on file    Active member of club or organization: Not on file    Attends meetings of clubs or organizations: Not on file    Relationship status: Not on file  . Intimate partner violence:    Fear of current or ex partner: Not on file    Emotionally abused: Not on file    Physically abused: Not on file    Forced sexual activity: Not on file  Other Topics Concern  . Not on file  Social History Narrative  . Not on file    FAMILY HISTORY: Family History  Problem Relation Age of Onset  . Migraines Mother   . Cancer Father        biological father with lung to brain   . Migraines Maternal Grandmother     ALLERGIES:  is allergic to naproxen.  MEDICATIONS:  Current Outpatient Medications  Medication Sig Dispense Refill  . acetaminophen (TYLENOL) 500 MG tablet Take 1,000 mg by mouth every 6 (six) hours as needed.    . traMADol (ULTRAM) 50 MG tablet Take 1 tablet (50 mg total) by mouth every 8 (eight) hours. (Patient not taking: Reported on 08/24/2017) 63 tablet 0   No current facility-administered medications for this visit.     REVIEW OF SYSTEMS:   Constitutional: Denies fevers, chills, abnormal night sweats, or weight loss  Ears, nose, mouth, throat, and face: Denies mucositis or sore throat Respiratory: Denies wheezes (+) chronic productive  cough at baseline (+) DOE Cardiovascular: Denies palpitation, chest discomfort (+) bilateral lower extremity swelling Gastrointestinal:  Denies nausea, vomiting, constipation, diarrhea, abdominal pain, heartburn or change in bowel habits Skin: Denies abnormal skin rashes  Lymphatics: Denies new lymphadenopathy or easy bruising Neurological:Denies numbness, tingling or new weaknesses Behavioral/Psych: Mood is stable, no new changes   All other systems were reviewed with the patient and are negative.  PHYSICAL EXAMINATION: ECOG PERFORMANCE STATUS: 0 - Asymptomatic  Vitals:   08/24/17 1329  BP: 125/85  Pulse: 69  Resp: 18  Temp: 97.8 F (36.6 C)  SpO2: 97%   Filed Weights   08/24/17 1329  Weight: 190 lb 14.4 oz (86.6 kg)    GENERAL:alert, no distress and comfortable SKIN: no rashes; multiple spider nevi to upper  chest and arms EYES: sclera clear OROPHARYNX:no thrush or ulcers  LYMPH:  no palpable cervical, supraclavicular, or axillary lymphadenopathy LUNGS: clear to auscultation with normal breathing effort HEART: regular rate & rhythm, bilateral pitting lower extremity edema ABDOMEN:abdomen soft, non-tender and normal bowel sounds. No hepatomegaly  Musculoskeletal:no cyanosis of digits and no clubbing  PSYCH: alert & oriented x 3 with fluent speech NEURO: no focal motor/sensory deficits  LABORATORY DATA:  I have reviewed the data as listed CBC Latest Ref Rng & Units 07/04/2017 11/11/2011  WBC 4.0 - 10.5 K/uL 4.5 7.7  Hemoglobin 12.0 - 15.0 g/dL 13.3 16.0  Hematocrit 36.0 - 46.0 % 40.1 51.5(A)  Platelets 150.0 - 400.0 K/uL 70.0(L) -   CMP Latest Ref Rng & Units 07/13/2017 04/27/2017 03/12/2017  Glucose 65 - 99 mg/dL - 97 -  BUN 7 - 25 mg/dL - 13 -  Creatinine 0.44 - 1.00 mg/dL 0.60 0.54 -  Sodium 135 - 146 mmol/L - 139 -  Potassium 3.5 - 5.3 mmol/L - 4.3 -  Chloride 98 - 110 mmol/L - 108 -  CO2 20 - 32 mmol/L - 27 -  Calcium 8.6 - 10.4 mg/dL - 8.7 -  Total Protein 6.0  - 8.5 g/dL - - 7.0  Total Bilirubin 0.0 - 1.2 mg/dL - - 0.4  Alkaline Phos 39 - 117 IU/L - - 271(H)  AST 0 - 40 IU/L - - 83(H)  ALT 6 - 29 U/L - 54(H) 41(H)     RADIOGRAPHIC STUDIES: I have personally reviewed the radiological images as listed and agreed with the findings in the report. Mr Liver W Wo Contrast  Result Date: 08/10/2017 CLINICAL DATA:  Cirrhosis and chronic hepatitis-C. Liver mass on recent CT. EXAM: MRI ABDOMEN WITHOUT AND WITH CONTRAST TECHNIQUE: Multiplanar multisequence MR imaging of the abdomen was performed both before and after the administration of intravenous contrast. CONTRAST:  23m MULTIHANCE GADOBENATE DIMEGLUMINE 529 MG/ML IV SOLN COMPARISON:  CT on 07/13/2017 FINDINGS: Lower chest: No acute findings. Hepatobiliary: Hepatic cirrhosis again demonstrated. A 1.8 cm mass is seen in segment 7 on image 43/1001, which shows homogeneous hypervascular enhancement, delayed washout, and peripheral rim enhancement. A 1.0 cm mass is seen in segment 2 on image 41/1002 which shows the same characteristics. These findings are diagnostic of hepatocellular carcinoma. In addition, there is a 9 mm hypervascular lesion in segment 2 on image 33/1001, a 9 mm hypervascular lesion segment 8 on image 41/1001, and an 8 mm hypervascular lesion in segment 5 on image 81/1001. These lesions show no definite delayed washout or peripheral rim enhancement but are too small to characterize. Several gallstones are seen, however there is no evidence of cholecystitis or biliary ductal dilatation. Pancreas:  No mass or inflammatory changes. Spleen: Mild splenomegaly, consistent with portal venous hypertension. Adrenals/Urinary Tract: No masses identified. No evidence of hydronephrosis. Stomach/Bowel: Visualized portion unremarkable. Vascular/Lymphatic: No pathologically enlarged lymph nodes identified. No abdominal aortic aneurysm. Portosystemic collaterals in the gastrohepatic and gastrosplenic ligaments are  consistent with portal venous hypertension. Portal veins are patent. Other:  No evidence of ascites. Musculoskeletal:  No suspicious bone lesions identified. IMPRESSION: Hepatic cirrhosis and findings of portal venous hypertension. 1.8 cm hypervascular mass in segment 7 and 1.0 cm hypervascular mass in segment 2, both of which are diagnostic of hepatocellular carcinoma. At least 3 other less than 1 cm hypervascular lesions are too small to characterize, but suspicious for additional hepatocellular carcinomas. No evidence of abdominal metastatic disease. Electronically Signed   By:  Earle Gell M.D.   On: 08/10/2017 15:25    ASSESSMENT & PLAN: Joycie Aerts is a pleasant 62 year old caucasian female with treatment naive Hep C and liver cirrhosis found to have multiple liver masses on initial [place] screening and elevated AFP.  1. Hepatocellular carcinoma -we reviewed her medical record in detail with the patient and her family. MRI images were reviewed independently and with the patient during today's exam. Her MRI findings and elevated AFP are highly consistent with hepatocellular carcinoma. She does not likely need biopsy. -She has what appears to be multifocal disease, with 2 lesions that are consistent with Kappa and 3 smaller more indeterminate lesions. Dr. Burr Medico recommends CT chest to complete staging work up. -Will review her case in GI tumor board in 2 weeks to discuss if she is a surgical candidate. Given the location of the surgery may not be feasible. Dr. Burr Medico discussed the possibility of liver transplant and undergoing other local therapy as a bridge to transplant.  -More likely she will be a candidate for ablation with IR. Will discuss further with multidisciplinary team and f/u with the patient via phone with recommendations.  -No matter her treatment, we will serve as the primary oncology team, help with referrals to other specialties, and monitor her condition long term.   2. Treatment naive Hep  C -she was planned to have treatment with 12 week course of Epclusa per Janene Madeira, NP in [place]; treatment was delayed and ultimately on hold due to insurance issues   3. Liver cirrhosis with portal venous hypertension -Followed by Dr. Loletha Carrow, she will ultimately need EGD for variceal screening  -liver function is adequate   4. Social issues  -She currently does not have insurance, has been paying for all co-pays out of pocket,  -I referred her to social work for ongoing financial assistance and possibly Insurance underwriter. She also met with Stefanie Libel our financial rep today.   PLAN: -labs, imaging, and medical record reviewed -CT chest next week to complete work up -Discuss in GI tumor board with surgery, IR, etc.  -f/u with patient with recommendations, f/u pending  -Referral to social work and financial advocate   Orders Placed This Encounter  Procedures  . CT Chest W Contrast    Standing Status:   Future    Standing Expiration Date:   08/24/2018    Order Specific Question:   ** REASON FOR EXAM (FREE TEXT)    Answer:   r/o metastatic disease    Order Specific Question:   If indicated for the ordered procedure, I authorize the administration of contrast media per Radiology protocol    Answer:   Yes    Order Specific Question:   Is patient pregnant?    Answer:   No    Order Specific Question:   Preferred imaging location?    Answer:   [facility]    Order Specific Question:   Radiology Contrast Protocol - do NOT remove file path    Answer:   [email or web address]  . Ambulatory referral to Social Work    Referral Priority:   Urgent    Referral Type:   Consultation    Referral Reason:   Specialty Services Required    Number of Visits Requested:   1    All questions were answered. The patient knows to call the clinic with any problems, questions or concerns. I spent 45 minutes counseling the patient face to face. The total time spent in  the  appointment was 60 minutes and more than 50% was on counseling.     Alla Feeling, NP 08/24/2017   Addendum  I have seen the patient, examined her. I agree with the assessment and and plan and have edited the notes.   Renee Rice is a 62 yo female with recently screening discovered Hep C and liver cirrhosis, was found to have multiple liver lesions on her liver cancer screening images.  She is asymptomatic and feels well.  I have reviewed her liver MRI images with patient and her family members in person.  She has a 1.8 cm mass in segment 7, and a 1 cm mass in segment 2, both has diagnostic imaging features on the MRI for hepatocellular carcinoma.  She also has at least 3 other subcentimeter lesions in her liver, indeterminate.  She has good liver function.  Her AFP is elevated.  I discussed the liver cancer treatment options, including surgical resection, liver transplant, liver ablation by interventional radiology.  I will present her case in our next GI tumor board, to determine if she needs biopsy (I think she probably does not need) for tissue diagnosis, and treatment decision.  Due to the multifocal lesions, and additional indeterminate lesion, she may not be a good candidate for surgical resection or liver transplant (although may still worth of being evaluated), and likely she is a candidate for IR liver ablation. I also discussed the risk of recurrence after treatment, and the role of systemic therapy for more advanced disease.  I will follow her along, and make referral as needed. I will obtain a CT chest to complete staging. She is uninsured, stressed out by her financial burden from treatment, will refer her to SW.    Truitt Merle  08/24/2017

## 2017-08-24 NOTE — Telephone Encounter (Signed)
No LOS 8/9

## 2017-08-30 ENCOUNTER — Other Ambulatory Visit: Payer: Self-pay | Admitting: Emergency Medicine

## 2017-08-31 ENCOUNTER — Ambulatory Visit (HOSPITAL_COMMUNITY)
Admission: RE | Admit: 2017-08-31 | Discharge: 2017-08-31 | Disposition: A | Discharge status: Home / Self Care | Payer: Self-pay | Source: Ambulatory Visit | Attending: Nurse Practitioner | Admitting: Nurse Practitioner

## 2017-08-31 ENCOUNTER — Encounter (HOSPITAL_COMMUNITY): Payer: Self-pay

## 2017-08-31 DIAGNOSIS — C220 Liver cell carcinoma: Secondary | ICD-10-CM | POA: Insufficient documentation

## 2017-08-31 DIAGNOSIS — K8020 Calculus of gallbladder without cholecystitis without obstruction: Secondary | ICD-10-CM | POA: Insufficient documentation

## 2017-08-31 DIAGNOSIS — J439 Emphysema, unspecified: Secondary | ICD-10-CM | POA: Insufficient documentation

## 2017-08-31 LAB — POCT I-STAT CREATININE: CREATININE: 0.6 mg/dL (ref 0.44–1.00)

## 2017-08-31 MED ORDER — IOHEXOL 300 MG/ML  SOLN
75.0000 mL | Freq: Once | INTRAMUSCULAR | Status: AC | PRN
  Administered 2017-08-31: 75 mL via INTRAVENOUS

## 2017-09-05 DIAGNOSIS — C220 Liver cell carcinoma: Secondary | ICD-10-CM

## 2017-09-05 NOTE — Progress Notes (Signed)
Discussed at GI Cancer Conference 09/05/17. Recommendation is referral to IR for possible liver ablation and referral to Dr. Barry Dienes for discussion of surgical resection. Referrals placed. Left VM for pt requesting call back to discuss.

## 2017-09-11 ENCOUNTER — Other Ambulatory Visit: Payer: Self-pay | Admitting: Nurse Practitioner

## 2017-09-11 DIAGNOSIS — C220 Liver cell carcinoma: Secondary | ICD-10-CM

## 2017-09-11 NOTE — Progress Notes (Signed)
Patient returned call to discuss GI Conference discussion from 09/05/17. Patient agreeable to referrals to IR and Dr. Barry Dienes to discuss options for [facility].

## 2017-09-18 ENCOUNTER — Ambulatory Visit
Admission: RE | Admit: 2017-09-18 | Discharge: 2017-09-18 | Disposition: A | Discharge status: Home / Self Care | Payer: No Typology Code available for payment source | Source: Ambulatory Visit | Attending: Nurse Practitioner | Admitting: Nurse Practitioner

## 2017-09-18 ENCOUNTER — Other Ambulatory Visit: Payer: Self-pay | Admitting: Radiology

## 2017-09-18 DIAGNOSIS — C220 Liver cell carcinoma: Secondary | ICD-10-CM

## 2017-09-18 HISTORY — PX: IR RADIOLOGIST EVAL & MGMT: IMG5224

## 2017-09-18 NOTE — Consult Note (Signed)
Chief Complaint: New diagnosis of [place]  Referring Physician(s): Burton,Lacie K Feng (Oncology) Byerly (Hepatobilliary Surgery) Danis (GI)  History of Present Illness: Renee Rice is a 62 y.o. female who was in her baseline state of well health until earlier this year when she was diagnosed with hepatitis C during the work-up for lower extremity paresthesias.  Subsequent imaging demonstrated stigmata of cirrhosis with CT scan of the abdomen and pelvis performed 07/07/2017 demonstrating 2 indeterminate lesions within the liver.  Subsequent abdominal MRI performed 08/10/2017 confirmed these lesions to demonstrated image features compatible with hepatocellular carcinoma and also demonstrated 3 additional indeterminate though worrisome liver lesions not seen on the abdominal CT.  Subsequent AFP level obtained on 07/04/17 was found to be elevated at 27.8.  The patient was evaluated by Dr. Barry Dienes and deemed a nonoperative candidate given the number of liver lesions.  The patient is seen today for consultation of potential percutaneous treatment options.  She is unaccompanied and serves as her own historian.  Patient states that she has experienced grossly symmetric bilateral lower extremity swelling for near the entirety of the summer.  She also reports a rash involving her upper chest.    She is otherwise without complaint.  Specifically, no increased abdominal girth.  No yellowing of the skin or eyes.  No change in mental status.  No bloody or melanotic stools.  No vomiting or hematemesis.    No change in energy level or appetite.  Patient remains employed working full-time.  She is independent with all activities of daily living.    Past Medical History:  Diagnosis Date  . Allergic rhinitis   . Chronic viral hepatitis C ([place]) 04/27/2017   with cirrhosis  . Depression    in the past, no meds, no previous hospitalization   . Heart murmur     Past Surgical History:  Procedure  Laterality Date  . IR RADIOLOGIST EVAL & MGMT  09/18/2017    Allergies: Patient has no known allergies.  Medications: Prior to Admission medications   Medication Sig Start Date End Date Taking? Authorizing Provider  acetaminophen (TYLENOL) 500 MG tablet Take 1,000 mg by mouth every 6 (six) hours as needed.    [provider]     Family History  Problem Relation Age of Onset  . Migraines Mother   . Cancer Father        biological father with lung to brain   . Migraines Maternal Grandmother     Social History   Socioeconomic History  . Marital status: Married    Spouse name: Not on file  . Number of children: 0  . Years of education: Not on file  . Highest education level: Not on file  Occupational History  . Occupation: Geophysicist/field seismologist, Hydrologist    Comment: full time   Social Needs  . Financial resource strain: Not on file  . Food insecurity:    Worry: Not on file    Inability: Not on file  . Transportation needs:    Medical: Not on file    Non-medical: Not on file  Tobacco Use  . Smoking status: Current Every Day Smoker    Packs/day: 1.00    Years: 30.00    Pack years: 30.00    Types: Cigarettes  . Smokeless tobacco: Never Used  Substance and Sexual Activity  . Alcohol use: Yes    Comment: occassional 2 beers a month; never heavy use  . Drug use: No    Comment: remote  h/o IV drug use, none in decades   . Sexual activity: Not Currently  Lifestyle  . Physical activity:    Days per week: Not on file    Minutes per session: Not on file  . Stress: Not on file  Relationships  . Social connections:    Talks on phone: Not on file    Gets together: Not on file    Attends religious service: Not on file    Active member of club or organization: Not on file    Attends meetings of clubs or organizations: Not on file    Relationship status: Not on file  Other Topics Concern  . Not on file  Social History Narrative  . Not on file    ECOG Status: 0 -  Asymptomatic  Review of Systems: A 12 point ROS discussed and pertinent positives are indicated in the HPI above.  All other systems are negative.  Review of Systems  Constitutional: Negative for activity change, appetite change, fatigue, fever and unexpected weight change.  Respiratory: Negative.   Cardiovascular: Negative.   Gastrointestinal: Negative for abdominal distention, abdominal pain, blood in stool and vomiting.  Genitourinary: Negative.   Skin: Positive for rash.       Patient reports a rash involving the upper chest  Psychiatric/Behavioral: Negative for confusion.    Vital Signs: BP 118/71 (BP Location: Left Arm, Patient Position: Sitting, Cuff Size: Normal)   Pulse 68   Temp (!) 97.5 F (36.4 C)   Resp 14   Ht 6' (1.829 m)   Wt 86.2 kg   SpO2 98%   BMI 25.77 kg/m   Physical Exam  Constitutional: She appears well-developed and well-nourished.  Cardiovascular: Normal rate and regular rhythm.  Bilateral grossly symmetric 1-2+ pitting edema making palpation of the tibial pulses difficult. I am able to palpate the right common femoral arterial pulse.  Pulmonary/Chest: Effort normal and breath sounds normal.  Abdominal: Soft. Bowel sounds are normal. She exhibits no distension. There is no tenderness. There is no rebound and no guarding.  Skin: Skin is warm and dry.  Psychiatric: She has a normal mood and affect. Her behavior is normal. Thought content normal.  Nursing note and vitals reviewed.   Mallampati Score:     Imaging: Ct Chest W Contrast  Result Date: 08/31/2017 CLINICAL DATA:  Pt. States hx of hepatocellular ca dx'd 07/2017, pt. Denies any chest complaints Cr 0.6 08-31-2017 EXAM: CT CHEST WITH CONTRAST TECHNIQUE: Multidetector CT imaging of the chest was performed during intravenous contrast administration. CONTRAST:  54mL OMNIPAQUE IOHEXOL 300 MG/ML  SOLN COMPARISON:  CT abdomen 07/13/2017 FINDINGS: Cardiovascular: Heart size normal. No pericardial  effusion. No thoracic aortic aneurysm. Mediastinum/Nodes: No hilar or mediastinal adenopathy. Lungs/Pleura: No pleural effusion. No pneumothorax. Emphysema (ICD10-J43.9). No nodule or focal infiltrate. Upper Abdomen: At least 2 partially calcified stones greater than 1 cm in the lumen of the nondilated gallbladder. The segment 7 hepatic lesion seen on prior study is less conspicuous. No new liver lesion identified. No biliary ductal dilatation. Mild nodularity of the liver contour suggesting cirrhosis. The spleen measures at least 14.9 cm in sagittal length, incompletely visualized. Musculoskeletal: Spondylitic changes in the visualized lower cervical spine. Anterior vertebral endplate spurring at multiple levels in the mid and lower thoracic spine. Negative for fracture or worrisome bone lesion. DJD in bilateral shoulders. IMPRESSION: 1. No acute findings, nodule, or adenopathy. 2. Pulmonary emphysema 3. Cholelithiasis 4. Nodular liver contour suggesting cirrhosis, with splenomegaly. Electronically Signed  By: Lucrezia Europe M.D.   On: 08/31/2017 12:12   Ir Radiologist Eval & Mgmt  Result Date: 09/18/2017 Please refer to notes tab for details about interventional procedure. (Op Note)   Labs:  CBC: Recent Labs    07/04/17 1531  WBC 4.5  HGB 13.3  HCT 40.1  PLT 70.0*    COAGS: Recent Labs    04/27/17 1124  INR 1.2*    BMP: Recent Labs    04/27/17 1124 07/13/17 1613 08/31/17 0807  NA 139  --   --   K 4.3  --   --   CL 108  --   --   CO2 27  --   --   GLUCOSE 97  --   --   BUN 13  --   --   CALCIUM 8.7  --   --   CREATININE 0.54 0.60 0.60    LIVER FUNCTION TESTS: Recent Labs    03/12/17 1157 04/27/17 1125  BILITOT 0.4  --   AST 83*  --   ALT 41* 54*  ALKPHOS 271*  --   PROT 7.0  --   ALBUMIN 3.2*  --     TUMOR MARKERS: Recent Labs    07/04/17 1531  AFPTM 27.8*    Assessment and Plan:  Yana Schorr is a 62 y.o. female who was in her baseline state of well  health until earlier this year when she was diagnosed with hepatitis C and found to have indeterminate liver lesions worrisome for hepatocellular carcinoma.  Patient remains an ECOG 0, working full-time and independent with all activities of daily living.  Personal review of abdominal MRI performed 08/10/2017 demonstrated an approximate 0.9 cm lesion within the lateral segment of the left lobe of the liver (image 40, series 1001) as well as an approximately 1.8 cm enhancing lesion within the posterior subcapsular aspect of the right lobe of the liver (image 43) seen on preceding contrast-enhanced abdominal CT and demonstrating imaging features worrisome for hepatocellular carcinoma.    Additionally, there is an approximately 0.9 cm lesion within the dome of the left lobe of the liver (image 33) an approximately 0.9 cm lesion within the central aspect of the right lobe of the liver (image 41) and an approximately 0.8 cm lesion within the inferior most aspect of the right lobe of the liver (image 80) which were deemed indeterminate on abdominal MRI though worrisome for additional areas of hepatocellular carcinoma.  I also question an approximate 0.7 cm lesion within the posterior subcapsular aspect of the lateral segment of the left lobe of the liver (image 38).  The portal vein is patent.  There is no ascites.  In summary, the patient has 2 lesions which demonstrate imaging features compatible with hepatocellular carcinoma however has 3, potentially 4 additional indeterminate though worrisome lesions as well as an elevated AFP of 27.8 obtained on 07/04/2017.  I am worried that the patient has multifocal disease with greater than 3 lesions and as such the patient would NOT be a candidate for microwave ablation (microwave ablation requires under 3 lesions to be a candidate for this procedure).  As such, prolonged conversations were held with the patient regarding the benefits and risks of transcatheter  treatment with Y 90 radioembolization.  I explained that Y 90 radioembolization is performed for palliative purposes with hopes of achieving disease stability for several months but is not currative.  Potential treatment options, including conservative management, was discussed at length with the patient  who wishes to be aggressive and pursue any and all available treatment options.    The above was also discussed with referring oncologist, Dr. Burr Medico and ultimately the decision was made to proceed with arranging for Y 90 radioembolization.    Per patient report, she has secured health insurance.  We will obtain a CMP, though her bilirubin was found to be normal when last checked in mid April of this year.    All Y 90 radioembolization procedures will be performed at [facility] on an outpatient basis.  Given the presence of suspected bilobar disease, the Y-90 will will entail 3 separate procedures (mapping, right sided embo, left sided embo).   Thank you for this interesting consult.  I greatly enjoyed meeting Renee Rice and look forward to participating in their care.  A copy of this report was sent to the requesting provider on this date.  Electronically Signed: Sandi Mariscal 09/18/2017, 10:05 AM   I spent a total of 40 Minutes in face to face in clinical consultation, greater than 50% of which was counseling/coordinating care for [facility].

## 2017-09-21 LAB — COMPLETE METABOLIC PANEL WITH GFR
AG RATIO: 0.7 (calc) — AB (ref 1.0–2.5)
ALBUMIN MSPROF: 2.8 g/dL — AB (ref 3.6–5.1)
ALKALINE PHOSPHATASE (APISO): 239 U/L — AB (ref 33–130)
ALT: 52 U/L — ABNORMAL HIGH (ref 6–29)
AST: 92 U/L — ABNORMAL HIGH (ref 10–35)
BILIRUBIN TOTAL: 1.6 mg/dL — AB (ref 0.2–1.2)
BUN: 11 mg/dL (ref 7–25)
CO2: 26 mmol/L (ref 20–32)
CREATININE: 0.65 mg/dL (ref 0.50–0.99)
Calcium: 8.2 mg/dL — ABNORMAL LOW (ref 8.6–10.4)
Chloride: 107 mmol/L (ref 98–110)
GFR, Est African American: 112 mL/min/{1.73_m2} (ref >=60)
GFR, Est Non African American: 96 mL/min/{1.73_m2} (ref >=60)
GLOBULIN: 4.2 g/dL — AB (ref 1.9–3.7)
Glucose, Bld: 132 mg/dL (ref 65–139)
POTASSIUM: 3.8 mmol/L (ref 3.5–5.3)
SODIUM: 137 mmol/L (ref 135–146)
Total Protein: 7 g/dL (ref 6.1–8.1)

## 2017-09-21 LAB — AFP TUMOR MARKER: AFP TUMOR MARKER: 21.8 ng/mL — AB

## 2017-09-28 ENCOUNTER — Other Ambulatory Visit (HOSPITAL_COMMUNITY): Payer: Self-pay | Admitting: Interventional Radiology

## 2017-09-28 DIAGNOSIS — C220 Liver cell carcinoma: Secondary | ICD-10-CM

## 2017-10-02 ENCOUNTER — Other Ambulatory Visit: Payer: Self-pay

## 2017-10-02 ENCOUNTER — Ambulatory Visit: Payer: Self-pay | Admitting: Urgent Care

## 2017-10-02 ENCOUNTER — Encounter: Payer: Self-pay | Admitting: Urgent Care

## 2017-10-02 ENCOUNTER — Telehealth: Payer: Self-pay | Admitting: Urgent Care

## 2017-10-02 VITALS — BP 128/77 | HR 72 | Temp 97.6°F | Ht 71.0 in | Wt 193.2 lb

## 2017-10-02 DIAGNOSIS — C229 Malignant neoplasm of liver, not specified as primary or secondary: Secondary | ICD-10-CM

## 2017-10-02 DIAGNOSIS — R011 Cardiac murmur, unspecified: Secondary | ICD-10-CM

## 2017-10-02 DIAGNOSIS — K7400 Hepatic fibrosis, unspecified: Secondary | ICD-10-CM

## 2017-10-02 DIAGNOSIS — Z0289 Encounter for other administrative examinations: Secondary | ICD-10-CM

## 2017-10-02 DIAGNOSIS — B182 Chronic viral hepatitis C: Secondary | ICD-10-CM

## 2017-10-02 DIAGNOSIS — F17210 Nicotine dependence, cigarettes, uncomplicated: Secondary | ICD-10-CM

## 2017-10-02 DIAGNOSIS — Z024 Encounter for examination for driving license: Secondary | ICD-10-CM

## 2017-10-02 DIAGNOSIS — Z87820 Personal history of traumatic brain injury: Secondary | ICD-10-CM

## 2017-10-02 NOTE — Telephone Encounter (Signed)
Appt was made for the wrong patient. - a Renee Rice. I cancelled the wrong person appt and made the correct appt. 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 [name] and I explained to the pt that if something comes back "wrong" with paperwork or she needs to be seen a later date for something, then she will have to see a different provider due to [facility] leaving as well as pay the $95 again.  Patient verbalized understanding and said that she would take her chances.
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
## 2017-10-02 NOTE — Patient Instructions (Signed)
° ° ° °  If you have lab work done today you will be contacted with your lab results within the next 2 weeks.  If you have not heard from us then please contact us. The fastest way to get your results is to register for My Chart. ° ° °IF you received an x-ray today, you will receive an invoice from [facility]. Please contact [facility] at [phone] with questions or concerns regarding your invoice.  ° °IF you received labwork today, you will receive an invoice from [facility]. Please contact [facility] at [phone] with questions or concerns regarding your invoice.  ° °Our billing staff will not be able to assist you with questions regarding bills from these companies. ° °You will be contacted with the lab results as soon as they are available. The fastest way to get your results is to activate your My Chart account. Instructions are located on the last page of this paperwork. If you have not heard from us regarding the results in 2 weeks, please contact this office. °  ° ° ° °

## 2017-10-18 ENCOUNTER — Other Ambulatory Visit: Payer: Self-pay | Admitting: Student

## 2017-10-19 ENCOUNTER — Ambulatory Visit (HOSPITAL_COMMUNITY)
Admission: RE | Admit: 2017-10-19 | Discharge: 2017-10-19 | Disposition: A | Discharge status: Home / Self Care | Payer: No Typology Code available for payment source | Source: Ambulatory Visit | Attending: Interventional Radiology | Admitting: Interventional Radiology

## 2017-10-19 ENCOUNTER — Other Ambulatory Visit (HOSPITAL_COMMUNITY): Payer: Self-pay | Admitting: Interventional Radiology

## 2017-10-19 ENCOUNTER — Encounter (HOSPITAL_COMMUNITY): Payer: Self-pay

## 2017-10-19 ENCOUNTER — Encounter (HOSPITAL_COMMUNITY)
Admission: RE | Admit: 2017-10-19 | Discharge: 2017-10-19 | Disposition: A | Discharge status: Home / Self Care | Payer: No Typology Code available for payment source | Source: Ambulatory Visit | Attending: Interventional Radiology | Admitting: Interventional Radiology

## 2017-10-19 DIAGNOSIS — C220 Liver cell carcinoma: Secondary | ICD-10-CM

## 2017-10-19 DIAGNOSIS — B182 Chronic viral hepatitis C: Secondary | ICD-10-CM | POA: Insufficient documentation

## 2017-10-19 DIAGNOSIS — K766 Portal hypertension: Secondary | ICD-10-CM | POA: Insufficient documentation

## 2017-10-19 DIAGNOSIS — K7460 Unspecified cirrhosis of liver: Secondary | ICD-10-CM | POA: Insufficient documentation

## 2017-10-19 DIAGNOSIS — F329 Major depressive disorder, single episode, unspecified: Secondary | ICD-10-CM | POA: Insufficient documentation

## 2017-10-19 HISTORY — PX: IR US GUIDE VASC ACCESS RIGHT: IMG2390

## 2017-10-19 HISTORY — PX: IR EMBO ARTERIAL NOT HEMORR HEMANG INC GUIDE ROADMAPPING: IMG5448

## 2017-10-19 HISTORY — PX: IR ANGIOGRAM SELECTIVE EACH ADDITIONAL VESSEL: IMG667

## 2017-10-19 HISTORY — PX: IR ANGIOGRAM VISCERAL SELECTIVE: IMG657

## 2017-10-19 LAB — CBC WITH DIFFERENTIAL/PLATELET
Basophils Absolute: 0 10*3/uL (ref 0.0–0.1)
Basophils Relative: 0 %
Eosinophils Absolute: 0.1 10*3/uL (ref 0.0–0.7)
Eosinophils Relative: 3 %
HCT: 39.6 % (ref 36.0–46.0)
Hemoglobin: 13.3 g/dL (ref 12.0–15.0)
Lymphocytes Relative: 30 %
Lymphs Abs: 1.1 10*3/uL (ref 0.7–4.0)
MCH: 31.7 pg (ref 26.0–34.0)
MCHC: 33.6 g/dL (ref 30.0–36.0)
MCV: 94.3 fL (ref 78.0–100.0)
Monocytes Absolute: 0.3 10*3/uL (ref 0.1–1.0)
Monocytes Relative: 9 %
Neutro Abs: 2 10*3/uL (ref 1.7–7.7)
Neutrophils Relative %: 58 %
Platelets: 65 10*3/uL — ABNORMAL LOW (ref 150–400)
RBC: 4.2 MIL/uL (ref 3.87–5.11)
RDW: 14.5 % (ref 11.5–15.5)
WBC: 3.5 10*3/uL — ABNORMAL LOW (ref 4.0–10.5)

## 2017-10-19 LAB — COMPREHENSIVE METABOLIC PANEL
ALT: 64 U/L — ABNORMAL HIGH (ref 0–44)
ANION GAP: 6 (ref 5–15)
AST: 110 U/L — ABNORMAL HIGH (ref 15–41)
Albumin: 2.6 g/dL — ABNORMAL LOW (ref 3.5–5.0)
Alkaline Phosphatase: 226 U/L — ABNORMAL HIGH (ref 38–126)
BUN: 16 mg/dL (ref 6–20)
CHLORIDE: 112 mmol/L — AB (ref 98–111)
CO2: 23 mmol/L (ref 22–32)
CREATININE: 0.62 mg/dL (ref 0.44–1.00)
Calcium: 8.4 mg/dL — ABNORMAL LOW (ref 8.9–10.3)
Glucose, Bld: 98 mg/dL (ref 70–99)
POTASSIUM: 4.1 mmol/L (ref 3.5–5.1)
SODIUM: 141 mmol/L (ref 135–145)
TOTAL PROTEIN: 6.8 g/dL (ref 6.5–8.1)
Total Bilirubin: 1.6 mg/dL — ABNORMAL HIGH (ref 0.3–1.2)

## 2017-10-19 LAB — PROTIME-INR
INR: 1.13
Prothrombin Time: 14.4 s (ref 11.4–15.2)

## 2017-10-19 LAB — APTT: aPTT: 36 s (ref 24–36)

## 2017-10-19 MED ORDER — SODIUM CHLORIDE 0.9 % IV SOLN
INTRAVENOUS | Status: DC
  Administered 2017-10-19: 09:00:00 via INTRAVENOUS

## 2017-10-19 MED ORDER — TECHNETIUM TO 99M ALBUMIN AGGREGATED
5.3000 | Freq: Once | INTRAVENOUS | Status: AC
  Administered 2017-10-19: 5.3 via INTRAVENOUS

## 2017-10-19 MED ORDER — IOHEXOL 300 MG/ML  SOLN
15.0000 mL | Freq: Once | INTRAMUSCULAR | Status: AC | PRN
  Administered 2017-10-19: 15 mL via INTRA_ARTERIAL

## 2017-10-19 MED ORDER — FENTANYL CITRATE (PF) 100 MCG/2ML IJ SOLN
INTRAMUSCULAR | Status: AC | PRN
  Administered 2017-10-19 (×4): 50 ug via INTRAVENOUS

## 2017-10-19 MED ORDER — LIDOCAINE HCL 1 % IJ SOLN
INTRAMUSCULAR | Status: AC
  Filled 2017-10-19: qty 20

## 2017-10-19 MED ORDER — LIDOCAINE HCL (PF) 1 % IJ SOLN
INTRAMUSCULAR | Status: AC | PRN
  Administered 2017-10-19: 10 mL

## 2017-10-19 MED ORDER — MIDAZOLAM HCL 2 MG/2ML IJ SOLN
INTRAMUSCULAR | Status: AC
  Filled 2017-10-19: qty 4

## 2017-10-19 MED ORDER — IOHEXOL 300 MG/ML  SOLN
80.0000 mL | Freq: Once | INTRAMUSCULAR | Status: AC | PRN
  Administered 2017-10-19: 80 mL via INTRA_ARTERIAL

## 2017-10-19 MED ORDER — MIDAZOLAM HCL 2 MG/2ML IJ SOLN
INTRAMUSCULAR | Status: AC | PRN
  Administered 2017-10-19 (×4): 1 mg via INTRAVENOUS

## 2017-10-19 MED ORDER — FENTANYL CITRATE (PF) 100 MCG/2ML IJ SOLN
INTRAMUSCULAR | Status: AC
  Filled 2017-10-19: qty 4

## 2017-10-19 NOTE — Sedation Documentation (Signed)
Patient transported to [facility] via stretcher accompanied by this RN.

## 2017-10-19 NOTE — H&P (Signed)
Referring Physician(s): Feng,Y/Byerly,F/Danis,H  Supervising Physician: Sandi Mariscal  Patient Status:  [facility] OP  Chief Complaint:  Multifocal hepatocellular carcinoma  Subjective: Patient familiar to IR service from recent consultation with Dr. Pascal Lux on 09/18/2017 to discuss treatment options for probable multifocal hepatocellular carcinoma.  She has a history of hepatitis C along with elevated AFP and imaging findings of hepatic cirrhosis with portal venous hypertension, 1.8 cm hypervascular mass in segment 7 and a 1 cm hypervascular mass in segment 2 ,both diagnostic of hepatocellular carcinoma.  There are at least 3 other less than 1 cm hypervascular lesions which are too small to characterize but suspicious for additional HCC's.  She was deemed an appropriate candidate for hepatic Y 90 radioembolization and presents today for arterial roadmapping study/embolization/test Y-90 dosing.  She currently denies fever, headache, chest pain, dyspnea, cough, abdominal/back pain, nausea, vomiting.  She does complain of bilateral lower extremity swelling.  Past Medical History:  Diagnosis Date  . Allergic rhinitis   . Chronic viral hepatitis C ([place]) 04/27/2017   with cirrhosis  . Depression    in the past, no meds, no previous hospitalization   . Heart murmur    Past Surgical History:  Procedure Laterality Date  . IR RADIOLOGIST EVAL & MGMT  09/18/2017       Allergies: Patient has no known allergies.  Medications: Prior to Admission medications   Medication Sig Start Date End Date Taking? Authorizing Provider  acetaminophen (TYLENOL) 500 MG tablet Take 1,000 mg by mouth every 6 (six) hours as needed.   Yes [provider]     Vital Signs: Blood pressure 108/73, heart rate 70, temp 98.5, respirations 16, O2 sat 98% room air  Physical Exam awake, alert.  Chest clear to auscultation bilaterally.  Heart with regular rate and rhythm.  Abdomen soft, positive bowel sounds,  nontender.  1-2+ bilateral lower extremity edema noted, slightly more so on the left.  Imaging: No results found.  Labs:  CBC: Recent Labs    07/04/17 1531  WBC 4.5  HGB 13.3  HCT 40.1  PLT 70.0*    COAGS: Recent Labs    04/27/17 1124 10/19/17 0820  INR 1.2* 1.13  APTT  --  36    BMP: Recent Labs    04/27/17 1124 07/13/17 1613 08/31/17 0807 09/20/17 1232  NA 139  --   --  137  K 4.3  --   --  3.8  CL 108  --   --  107  CO2 27  --   --  26  GLUCOSE 97  --   --  132  BUN 13  --   --  11  CALCIUM 8.7  --   --  8.2*  CREATININE 0.54 0.60 0.60 0.65  GFRNONAA  --   --   --  96  GFRAA  --   --   --  112    LIVER FUNCTION TESTS: Recent Labs    03/12/17 1157 04/27/17 1125 09/20/17 1232  BILITOT 0.4  --  1.6*  AST 83*  --  92*  ALT 41* 54* 52*  ALKPHOS 271*  --   --   PROT 7.0  --  7.0  ALBUMIN 3.2*  --   --     Assessment and Plan: Patient with history of hepatitis C, cirrhosis, imaging findings consistent with multifocal hepatocellular carcinoma; seen in consultation by Dr. Pascal Lux on 09/18/2017 and deemed an appropriate candidate for Y 90 hepatic radioembolization.  She presents today for arterial roadmapping study/embolization/ test Y-90 dosing.Risks and benefits of procedure were discussed with the patient/spouse including, but not limited to bleeding, infection, vascular injury or contrast induced renal failure.  This interventional procedure involves the use of X-rays and because of the nature of the planned procedure, it is possible that we will have prolonged use of X-ray fluoroscopy.  Potential radiation risks to you include (but are not limited to) the following: - A slightly elevated risk for cancer  several years later in life. This risk is typically less than 0.5% percent. This risk is low in comparison to the normal incidence of human cancer, which is 33% for women and 50% for men according to the [place]. - Radiation induced  injury can include skin redness, resembling a rash, tissue breakdown / ulcers and hair loss (which can be temporary or permanent).   The likelihood of either of these occurring depends on the difficulty of the procedure and whether you are sensitive to radiation due to previous procedures, disease, or genetic conditions.   IF your procedure requires a prolonged use of radiation, you will be notified and given written instructions for further action.  It is your responsibility to monitor the irradiated area for the 2 weeks following the procedure and to notify your physician if you are concerned that you have suffered a radiation induced injury.    All of the patient's questions were answered, patient is agreeable to proceed.  Consent signed and in chart.  LABS PENDING   Electronically Signed: D. Rowe Robert, PA-C 10/19/2017, 8:52 AM   I spent a total of 25 minutes at the the patient's bedside AND on the patient's hospital floor or unit, greater than 50% of which was counseling/coordinating care for hepatic/visceral arteriogram with embolization and test Y 90 dosing

## 2017-10-19 NOTE — Sedation Documentation (Signed)
Patient is resting comfortably, lightly snoring, in NAD.
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
## 2017-10-19 NOTE — Discharge Instructions (Addendum)
Moderate Conscious Sedation, Adult, Care After These instructions provide you with information about caring for yourself after your procedure. Your health care provider may also give you more specific instructions. Your treatment has been planned according to current medical practices, but problems sometimes occur. Call your health care provider if you have any problems or questions after your procedure. What can I expect after the procedure? After your procedure, it is common:  To feel sleepy for several hours.  To feel clumsy and have poor balance for several hours.  To have poor judgment for several hours.  To vomit if you eat too soon.  Follow these instructions at home: For at least 24 hours after the procedure:   Do not: ? Participate in activities where you could fall or become injured. ? Drive. ? Use heavy machinery. ? Drink alcohol. ? Take sleeping pills or medicines that cause drowsiness. ? Make important decisions or sign legal documents. ? Take care of children on your own.  Rest. Eating and drinking  Follow the diet recommended by your health care provider.  If you vomit: ? Drink water, juice, or soup when you can drink without vomiting. ? Make sure you have little or no nausea before eating solid foods. General instructions  Have a responsible adult stay with you until you are awake and alert.  Take over-the-counter and prescription medicines only as told by your health care provider.  If you smoke, do not smoke without supervision.  Keep all follow-up visits as told by your health care provider. This is important. Contact a health care provider if:  You keep feeling nauseous or you keep vomiting.  You feel light-headed.  You develop a rash.  You have a fever. Get help right away if:  You have trouble breathing. This information is not intended to replace advice given to you by your health care provider. Make sure you discuss any questions you have  with your health care provider. Document Released: 10/23/2012 Document Revised: 06/07/2015 Document Reviewed: 04/24/2015 [employer] Interactive Patient Education  2018 [place].  Femoral Site Care Refer to this sheet in the next few weeks. These instructions provide you with information about caring for yourself after your procedure. Your health care provider may also give you more specific instructions. Your treatment has been planned according to current medical practices, but problems sometimes occur. Call your health care provider if you have any problems or questions after your procedure. What can I expect after the procedure? After your procedure, it is typical to have the following:  Bruising at the site that usually fades within 1-2 weeks.  Blood collecting in the tissue (hematoma) that may be painful to the touch. It should usually decrease in size and tenderness within 1-2 weeks.  Follow these instructions at home:  Take medicines only as directed by your health care provider.  You may shower 24-48 hours after the procedure or as directed by your health care provider. Remove the bandage (dressing) and gently wash the site with plain soap and water. Pat the area dry with a clean towel. Do not rub the site, because this may cause bleeding.  Do not take baths, swim, or use a hot tub until your health care provider approves.  Check your insertion site every day for redness, swelling, or drainage.  Do not apply powder or lotion to the site.  Limit use of stairs to twice a day for the first 2-3 days or as directed by your health care provider.  Do not squat for the first 2-3 days or as directed by your health care provider.  Do not lift over 10 lb (4.5 kg) for 5 days after your procedure or as directed by your health care provider.  Ask your health care provider when it is okay to: ? Return to work or school. ? Resume usual physical activities or sports. ? Resume sexual  activity.  Do not drive home if you are discharged the same day as the procedure. Have someone else drive you.  You may drive 24 hours after the procedure unless otherwise instructed by your health care provider.  Do not operate machinery or power tools for 24 hours after the procedure or as directed by your health care provider.  If your procedure was done as an outpatient procedure, which means that you went home the same day as your procedure, a responsible adult should be with you for the first 24 hours after you arrive home.  Keep all follow-up visits as directed by your health care provider. This is important. Contact a health care provider if:  You have a fever.  You have chills.  You have increased bleeding from the site. Hold pressure on the site. Get help right away if:  You have unusual pain at the site.  You have redness, warmth, or swelling at the site.  You have drainage (other than a small amount of blood on the dressing) from the site.  The site is bleeding, and the bleeding does not stop after 30 minutes of holding steady pressure on the site.  Your leg or foot becomes pale, cool, tingly, or numb. This information is not intended to replace advice given to you by your health care provider. Make sure you discuss any questions you have with your health care provider. Document Released: 09/05/2013 Document Revised: 06/10/2015 Document Reviewed: 07/22/2013 Elsevier Interactive Patient Education  [employer].      The following instructions apply for the next part of your treatment, for the next visit to IR Post Y-90 Radioembolization Discharge Instructions  You have been given a radioactive material during your procedure.  While it is safe for you to be discharged home from the hospital, you need to proceed directly home.    Do not use public transportation, including air travel, lasting more than 2 hours for 1 week.  Avoid crowded public places for 1  week.  Adult visitors should try to avoid close contact with you for 1 week.    Children and pregnant females should not visit or have close contact with you for 1 week.  Items that you touch are not radioactive.  Do not sleep in the same bed as your partner for 1 week, and a condom should be used for sexual activity during the first 24 hours.  Your blood may be radioactive and caution should be used if any bleeding occurs during the recovery period.  Body fluids may be radioactive for 24 hours.  Wash your hands after voiding.  Men should sit to urinate.  Dispose of any soiled materials (flush down toilet or place in trash at home) during the first day.  Drink 6 to 8 glasses of fluids per day for 5 days to hydrate yourself.  If you need to see a doctor during the first week, you must let them know that you were treated with yttrium-90 microspheres, and will be slightly radioactive.  They can call Interventional Radiology [phone] with any questions.

## 2017-10-19 NOTE — Sedation Documentation (Signed)
Patient is resting comfortably, snoring lightly, in NAD. 

## 2017-10-19 NOTE — Progress Notes (Signed)
Post pre Y-90 bedrest with some discomfort.  Lower back discomfort relieved with pillow under left uneffected leg/ knee. However Pt c/o overall abd discomfort.  Encouraged her to try to void in fracture pan while lying supine, unable.  Bladder scan reveals 247ml.  Pt has 45 min left of bedrest and states she will try again when she can ambulate to bathroom.

## 2017-10-19 NOTE — Procedures (Signed)
Pre-procedure Diagnosis: Multifocal HCC Post-procedure Diagnosis: Same  Post Y-90 radioembolization.    Complications: None Immediate  EBL: None  Keep right leg straight for 4 hrs (until 1600).    SignedSandi Mariscal Pager: [phone] 10/19/2017, 12:33 PM

## 2017-10-23 ENCOUNTER — Encounter (HOSPITAL_COMMUNITY): Payer: Self-pay | Admitting: Interventional Radiology

## 2017-10-24 ENCOUNTER — Encounter (HOSPITAL_COMMUNITY): Payer: Self-pay

## 2017-10-25 ENCOUNTER — Other Ambulatory Visit: Payer: Self-pay | Admitting: Physician Assistant

## 2017-10-26 NOTE — Telephone Encounter (Signed)
Patient is requesting a refill of the following medications: Requested Prescriptions   Pending Prescriptions Disp Refills  . traMADol (ULTRAM) 50 MG tablet [Pharmacy Med Name: TRAMADOL 50MG  TABLETS] 63 tablet 0    Sig: TAKE 1 TABLET(50 MG) BY MOUTH EVERY 8 HOURS    Date of patient request: 10/25/2017 Last office visit: 10/02/2017 Date of last refill:  Last refill amount:  Follow up time period per chart:  Not on medication list

## 2017-11-01 ENCOUNTER — Other Ambulatory Visit: Payer: Self-pay | Admitting: Student

## 2017-11-02 ENCOUNTER — Encounter (HOSPITAL_COMMUNITY)
Admission: RE | Admit: 2017-11-02 | Discharge: 2017-11-02 | Disposition: A | Discharge status: Home / Self Care | Payer: Managed Care, Other (non HMO) | Source: Ambulatory Visit | Attending: Interventional Radiology | Admitting: Interventional Radiology

## 2017-11-02 ENCOUNTER — Ambulatory Visit (HOSPITAL_COMMUNITY)
Admission: RE | Admit: 2017-11-02 | Discharge: 2017-11-02 | Disposition: A | Discharge status: Home / Self Care | Payer: Managed Care, Other (non HMO) | Source: Ambulatory Visit | Attending: Interventional Radiology | Admitting: Interventional Radiology

## 2017-11-02 ENCOUNTER — Other Ambulatory Visit: Payer: Self-pay

## 2017-11-02 ENCOUNTER — Other Ambulatory Visit (HOSPITAL_COMMUNITY): Payer: Self-pay | Admitting: Interventional Radiology

## 2017-11-02 ENCOUNTER — Encounter (HOSPITAL_COMMUNITY): Payer: Self-pay

## 2017-11-02 DIAGNOSIS — K7460 Unspecified cirrhosis of liver: Secondary | ICD-10-CM | POA: Insufficient documentation

## 2017-11-02 DIAGNOSIS — C220 Liver cell carcinoma: Secondary | ICD-10-CM

## 2017-11-02 DIAGNOSIS — K766 Portal hypertension: Secondary | ICD-10-CM | POA: Insufficient documentation

## 2017-11-02 DIAGNOSIS — B182 Chronic viral hepatitis C: Secondary | ICD-10-CM | POA: Insufficient documentation

## 2017-11-02 DIAGNOSIS — Z9889 Other specified postprocedural states: Secondary | ICD-10-CM | POA: Insufficient documentation

## 2017-11-02 HISTORY — PX: IR EMBO TUMOR ORGAN ISCHEMIA INFARCT INC GUIDE ROADMAPPING: IMG5449

## 2017-11-02 HISTORY — PX: IR ANGIOGRAM SELECTIVE EACH ADDITIONAL VESSEL: IMG667

## 2017-11-02 HISTORY — PX: IR ANGIOGRAM VISCERAL SELECTIVE: IMG657

## 2017-11-02 HISTORY — PX: IR US GUIDE VASC ACCESS RIGHT: IMG2390

## 2017-11-02 LAB — COMPREHENSIVE METABOLIC PANEL
ALT: 50 U/L — ABNORMAL HIGH (ref 0–44)
ANION GAP: 4 — AB (ref 5–15)
AST: 98 U/L — AB (ref 15–41)
Albumin: 2.5 g/dL — ABNORMAL LOW (ref 3.5–5.0)
Alkaline Phosphatase: 198 U/L — ABNORMAL HIGH (ref 38–126)
BILIRUBIN TOTAL: 1.6 mg/dL — AB (ref 0.3–1.2)
BUN: 16 mg/dL (ref 6–20)
CHLORIDE: 112 mmol/L — AB (ref 98–111)
CO2: 24 mmol/L (ref 22–32)
Calcium: 8.5 mg/dL — ABNORMAL LOW (ref 8.9–10.3)
Creatinine, Ser: 0.69 mg/dL (ref 0.44–1.00)
Glucose, Bld: 92 mg/dL (ref 70–99)
POTASSIUM: 4.5 mmol/L (ref 3.5–5.1)
Sodium: 140 mmol/L (ref 135–145)
TOTAL PROTEIN: 6.5 g/dL (ref 6.5–8.1)

## 2017-11-02 LAB — CBC WITH DIFFERENTIAL/PLATELET
Abs Immature Granulocytes: 0 10*3/uL (ref 0.00–0.07)
BASOS PCT: 1 %
Basophils Absolute: 0 10*3/uL (ref 0.0–0.1)
EOS ABS: 0.1 10*3/uL (ref 0.0–0.5)
EOS PCT: 3 %
HEMATOCRIT: 38.1 % (ref 36.0–46.0)
Hemoglobin: 12.4 g/dL (ref 12.0–15.0)
IMMATURE GRANULOCYTES: 0 %
LYMPHS ABS: 1.1 10*3/uL (ref 0.7–4.0)
Lymphocytes Relative: 32 %
MCH: 31.5 pg (ref 26.0–34.0)
MCHC: 32.5 g/dL (ref 30.0–36.0)
MCV: 96.7 fL (ref 80.0–100.0)
MONOS PCT: 10 %
Monocytes Absolute: 0.3 10*3/uL (ref 0.1–1.0)
NEUTROS PCT: 54 %
NRBC: 0 % (ref 0.0–0.2)
Neutro Abs: 1.9 10*3/uL (ref 1.7–7.7)
PLATELETS: 63 10*3/uL — AB (ref 150–400)
RBC: 3.94 MIL/uL (ref 3.87–5.11)
RDW: 14.1 % (ref 11.5–15.5)
WBC: 3.5 10*3/uL — ABNORMAL LOW (ref 4.0–10.5)

## 2017-11-02 LAB — PROTIME-INR
INR: 1.25
Prothrombin Time: 15.6 seconds — ABNORMAL HIGH (ref 11.4–15.2)

## 2017-11-02 LAB — APTT: aPTT: 36 seconds (ref 24–36)

## 2017-11-02 MED ORDER — FENTANYL CITRATE (PF) 100 MCG/2ML IJ SOLN
INTRAMUSCULAR | Status: AC | PRN
  Administered 2017-11-02 (×2): 25 ug via INTRAVENOUS
  Administered 2017-11-02: 50 ug via INTRAVENOUS

## 2017-11-02 MED ORDER — SODIUM CHLORIDE 0.9 % IV SOLN
8.0000 mg | Freq: Once | INTRAVENOUS | Status: AC
  Administered 2017-11-02: 8 mg via INTRAVENOUS
  Filled 2017-11-02: qty 4

## 2017-11-02 MED ORDER — YTTRIUM 90 INJECTION: 30.0000 | INJECTION | Freq: Once | INTRAVENOUS | Status: DC

## 2017-11-02 MED ORDER — SODIUM CHLORIDE 0.9 % IV SOLN
INTRAVENOUS | Status: DC
  Administered 2017-11-02: 09:00:00 via INTRAVENOUS

## 2017-11-02 MED ORDER — FENTANYL CITRATE (PF) 100 MCG/2ML IJ SOLN
INTRAMUSCULAR | Status: AC
  Filled 2017-11-02: qty 4

## 2017-11-02 MED ORDER — MIDAZOLAM HCL 2 MG/2ML IJ SOLN
INTRAMUSCULAR | Status: AC
  Filled 2017-11-02: qty 2

## 2017-11-02 MED ORDER — IOHEXOL 300 MG/ML  SOLN
100.0000 mL | Freq: Once | INTRAMUSCULAR | Status: AC | PRN
  Administered 2017-11-02: 40 mL via INTRAVENOUS

## 2017-11-02 MED ORDER — PIPERACILLIN-TAZOBACTAM 3.375 G IVPB 30 MIN
3.3750 g | Freq: Once | INTRAVENOUS | Status: AC
  Administered 2017-11-02: 3.375 g via INTRAVENOUS
  Filled 2017-11-02: qty 50

## 2017-11-02 MED ORDER — FAMOTIDINE IN NACL 20-0.9 MG/50ML-% IV SOLN
20.0000 mg | Freq: Once | INTRAVENOUS | Status: AC
  Administered 2017-11-02: 20 mg via INTRAVENOUS
  Filled 2017-11-02: qty 50

## 2017-11-02 MED ORDER — MIDAZOLAM HCL 2 MG/2ML IJ SOLN
INTRAMUSCULAR | Status: AC | PRN
  Administered 2017-11-02 (×2): 1 mg via INTRAVENOUS

## 2017-11-02 MED ORDER — IOHEXOL 300 MG/ML  SOLN
100.0000 mL | Freq: Once | INTRAMUSCULAR | Status: AC | PRN
  Administered 2017-11-02: 50 mL via INTRAVENOUS

## 2017-11-02 MED ORDER — DEXAMETHASONE SODIUM PHOSPHATE 10 MG/ML IJ SOLN
20.0000 mg | Freq: Once | INTRAMUSCULAR | Status: AC
  Administered 2017-11-02: 20 mg via INTRAVENOUS
  Filled 2017-11-02: qty 2

## 2017-11-02 MED ORDER — LIDOCAINE HCL 1 % IJ SOLN
INTRAMUSCULAR | Status: AC
  Filled 2017-11-02: qty 20

## 2017-11-02 MED ORDER — LIDOCAINE HCL (PF) 1 % IJ SOLN
INTRAMUSCULAR | Status: AC | PRN
  Administered 2017-11-02: 10 mL via SUBCUTANEOUS

## 2017-11-02 MED ORDER — DEXAMETHASONE SODIUM PHOSPHATE 10 MG/ML IJ SOLN
INTRAMUSCULAR | Status: AC
  Filled 2017-11-02: qty 1

## 2017-11-02 MED ORDER — PIPERACILLIN-TAZOBACTAM 3.375 G IVPB
INTRAVENOUS | Status: AC
  Filled 2017-11-02: qty 50

## 2017-11-02 NOTE — Procedures (Signed)
Pre-procedure Diagnosis: HCC Post-procedure Diagnosis: Same  Post Y-90 radioembolization of the right lobe of the liver.    Complications: None Immediate  EBL: None  Keep right leg straight for 4 hrs (until 1530)   Signed: Sandi Mariscal Pager: [phone] 11/02/2017, 11:25 AM

## 2017-11-02 NOTE — H&P (Signed)
Referring Physician(s): Feng,Y/Byerly,F/Danis,H  Supervising Physician: Sandi Mariscal  Patient Status:  [facility]  Chief Complaint:  Multifocal hepatocellular carcinoma  Subjective: Patient familiar to IR service from recent consultation with Dr. Pascal Lux on 09/18/2017 to discuss treatment options for probable multifocal hepatocellular carcinoma.  She has a history of hepatitis C along with elevated AFP and imaging findings of hepatic cirrhosis with portal venous hypertension, 1.8 cm hypervascular mass in segment 7 and a 1 cm hypervascular mass in segment 2 ,both diagnostic of hepatocellular carcinoma.  There are at least 3 other less than 1 cm hypervascular lesions which are too small to characterize but suspicious for additional HCC's.  She was deemed an appropriate candidate for hepatic Y 90 radioembolization. She underwent hepatic arteriogram roadmapping procedure on 10/8 and returns today for Y-90 treatment of the right lobe HCC lesions. She did well after her previous procedure, feels well today. Has been NPO this am. Husband at bedside.  Past Medical History:  Diagnosis Date  . Allergic rhinitis   . Chronic viral hepatitis C ([place]) 04/27/2017   with cirrhosis  . Depression    in the past, no meds, no previous hospitalization   . Heart murmur    Past Surgical History:  Procedure Laterality Date  . IR ANGIOGRAM SELECTIVE EACH ADDITIONAL VESSEL  10/19/2017  . IR ANGIOGRAM SELECTIVE EACH ADDITIONAL VESSEL  10/19/2017  . IR ANGIOGRAM SELECTIVE EACH ADDITIONAL VESSEL  10/19/2017  . IR ANGIOGRAM SELECTIVE EACH ADDITIONAL VESSEL  10/19/2017  . IR ANGIOGRAM VISCERAL SELECTIVE  10/19/2017  . IR ANGIOGRAM VISCERAL SELECTIVE  10/19/2017  . IR EMBO ARTERIAL NOT HEMORR HEMANG INC GUIDE ROADMAPPING  10/19/2017  . IR RADIOLOGIST EVAL & MGMT  09/18/2017  . IR US GUIDE VASC ACCESS RIGHT  10/19/2017       Allergies: Patient has no known allergies.  Medications: Prior to Admission medications     Medication Sig Start Date End Date Taking? Authorizing Provider  acetaminophen (TYLENOL) 500 MG tablet Take 1,000 mg by mouth every 6 (six) hours as needed.   Yes [provider]     Vital Signs: Blood pressure 108/73, heart rate 70, temp 98.5, respirations 16, O2 sat 98% room air  Physical Exam  Constitutional: She is oriented to person, place, and time. She appears well-developed and well-nourished. No distress.  HENT:  Head: Normocephalic.  Mouth/Throat: Oropharynx is clear and moist.  Neck: Normal range of motion.  Cardiovascular: Normal rate, regular rhythm, normal heart sounds and intact distal pulses.  Pulmonary/Chest: Effort normal and breath sounds normal. No respiratory distress.  Abdominal: Soft. She exhibits no distension and no mass. There is no tenderness.  Neurological: She is alert and oriented to person, place, and time.  Skin: Skin is warm and dry.  Psychiatric: She has a normal mood and affect. Judgment normal.  :   Imaging: No results found.  Labs:  CBC: Recent Labs    07/04/17 1531 10/19/17 0820  WBC 4.5 3.5*  HGB 13.3 13.3  HCT 40.1 39.6  PLT 70.0* 65*    COAGS: Recent Labs    04/27/17 1124 10/19/17 0820  INR 1.2* 1.13  APTT  --  36    BMP: Recent Labs    04/27/17 1124 07/13/17 1613 08/31/17 0807 09/20/17 1232 10/19/17 0820  NA 139  --   --  137 141  K 4.3  --   --  3.8 4.1  CL 108  --   --  107 112*  CO2  27  --   --  26 23  GLUCOSE 97  --   --  132 98  BUN 13  --   --  11 16  CALCIUM 8.7  --   --  8.2* 8.4*  CREATININE 0.54 0.60 0.60 0.65 0.62  GFRNONAA  --   --   --  96 >60  GFRAA  --   --   --  112 >60    LIVER FUNCTION TESTS: Recent Labs    03/12/17 1157 04/27/17 1125 09/20/17 1232 10/19/17 0820  BILITOT 0.4  --  1.6* 1.6*  AST 83*  --  92* 110*  ALT 41* 54* 52* 64*  ALKPHOS 271*  --   --  226*  PROT 7.0  --  7.0 6.8  ALBUMIN 3.2*  --   --  2.6*    Assessment and Plan: Patient with history of  hepatitis C, cirrhosis, imaging findings consistent with multifocal hepatocellular carcinoma; seen in consultation by Dr. Pascal Lux on 09/18/2017 and deemed an appropriate candidate for Y 90 hepatic radioembolization.  She presents today for hepatic arteriogram and Y-90 radioembolization to the right hepatic lobe lesions.  Risks and benefits of procedure were discussed with the patient/spouse including, but not limited to bleeding, infection, vascular injury or contrast induced renal failure.  This interventional procedure involves the use of X-rays and because of the nature of the planned procedure, it is possible that we will have prolonged use of X-ray fluoroscopy.  Potential radiation risks to you include (but are not limited to) the following: - A slightly elevated risk for cancer  several years later in life. This risk is typically less than 0.5% percent. This risk is low in comparison to the normal incidence of human cancer, which is 33% for women and 50% for men according to the [employer]. - Radiation induced injury can include skin redness, resembling a rash, tissue breakdown / ulcers and hair loss (which can be temporary or permanent).   The likelihood of either of these occurring depends on the difficulty of the procedure and whether you are sensitive to radiation due to previous procedures, disease, or genetic conditions.   IF your procedure requires a prolonged use of radiation, you will be notified and given written instructions for further action.  It is your responsibility to monitor the irradiated area for the 2 weeks following the procedure and to notify your physician if you are concerned that you have suffered a radiation induced injury.    All of the patient's questions were answered, patient is agreeable to proceed.  Consent signed and in chart.  LABS PENDING   Electronically Signed: Ascencion Dike, PA-C 11/02/2017, 8:59 AM   I spent a total of 25 minutes at  the the patient's bedside AND on the patient's hospital floor or unit, greater than 50% of which was counseling/coordinating care for hepatic/visceral arteriogram with y-90 radioembolization

## 2017-11-02 NOTE — Discharge Instructions (Addendum)
Post Y-90 Radioembolization Discharge Instructions ° °You have been given a radioactive material during your procedure.  While it is safe for you to be discharged home from the hospital, you need to proceed directly home.   ° °Do not use public transportation, including air travel, lasting more than 2 hours for 1 week. ° °Avoid crowded public places for 1 week. ° °Adult visitors should try to avoid close contact with you for 1 week.   ° °Children and pregnant females should not visit or have close contact with you for 1 week. ° °Items that you touch are not radioactive. ° °Do not sleep in the same bed as your partner for 1 week, and a condom should be used for sexual activity during the first 24 hours. ° °Your blood may be radioactive and caution should be used if any bleeding occurs during the recovery period. ° °Body fluids may be radioactive for 24 hours.  Wash your hands after voiding.  Men should sit to urinate.  Dispose of any soiled materials (flush down toilet or place in trash at home) during the first day. ° °Drink 6 to 8 glasses of fluids per day for 5 days to hydrate yourself. ° °If you need to see a doctor during the first week, you must let them know that you were treated with yttrium-90 microspheres, and will be slightly radioactive.  They can call Interventional Radiology [phone] with any questions.Moderate Conscious Sedation, Adult, Care After °These instructions provide you with information about caring for yourself after your procedure. Your health care provider may also give you more specific instructions. Your treatment has been planned according to current medical practices, but problems sometimes occur. Call your health care provider if you have any problems or questions after your procedure. °What can I expect after the procedure? °After your procedure, it is common: °· To feel sleepy for several hours. °· To feel clumsy and have poor balance for several hours. °· To have poor judgment for  several hours. °· To vomit if you eat too soon. ° °Follow these instructions at home: °For at least 24 hours after the procedure: ° °· Do not: °? Participate in activities where you could fall or become injured. °? Drive. °? Use heavy machinery. °? Drink alcohol. °? Take sleeping pills or medicines that cause drowsiness. °? Make important decisions or sign legal documents. °? Take care of children on your own. °· Rest. °Eating and drinking °· Follow the diet recommended by your health care provider. °· If you vomit: °? Drink water, juice, or soup when you can drink without vomiting. °? Make sure you have little or no nausea before eating solid foods. °General instructions °· Have a responsible adult stay with you until you are awake and alert. °· Take over-the-counter and prescription medicines only as told by your health care provider. °· If you smoke, do not smoke without supervision. °· Keep all follow-up visits as told by your health care provider. This is important. °Contact a health care provider if: °· You keep feeling nauseous or you keep vomiting. °· You feel light-headed. °· You develop a rash. °· You have a fever. °Get help right away if: °· You have trouble breathing. °This information is not intended to replace advice given to you by your health care provider. Make sure you discuss any questions you have with your health care provider. °Document Released: 10/23/2012 Document Revised: 06/07/2015 Document Reviewed: 04/24/2015 °Elsevier Interactive Patient Education © 2018 [employer] ° °

## 2017-11-02 NOTE — Sedation Documentation (Signed)
Patient is resting comfortably, snoring lightly, in NAD. 

## 2017-11-12 ENCOUNTER — Other Ambulatory Visit: Payer: Self-pay | Admitting: *Deleted

## 2017-11-12 ENCOUNTER — Other Ambulatory Visit (HOSPITAL_COMMUNITY): Payer: Self-pay | Admitting: Interventional Radiology

## 2017-11-12 DIAGNOSIS — C220 Liver cell carcinoma: Secondary | ICD-10-CM

## 2017-11-14 LAB — COMPLETE METABOLIC PANEL WITH GFR
AG Ratio: 0.7 (calc) — ABNORMAL LOW (ref 1.0–2.5)
ALT: 47 U/L — AB (ref 6–29)
AST: 106 U/L — AB (ref 10–35)
Albumin: 2.5 g/dL — ABNORMAL LOW (ref 3.6–5.1)
Alkaline phosphatase (APISO): 283 U/L — ABNORMAL HIGH (ref 33–130)
BILIRUBIN TOTAL: 3.1 mg/dL — AB (ref 0.2–1.2)
BUN: 14 mg/dL (ref 7–25)
CALCIUM: 7.9 mg/dL — AB (ref 8.6–10.4)
CHLORIDE: 109 mmol/L (ref 98–110)
CO2: 27 mmol/L (ref 20–32)
Creat: 0.57 mg/dL (ref 0.50–0.99)
GFR, EST AFRICAN AMERICAN: 117 mL/min/{1.73_m2} (ref >=60)
GFR, Est Non African American: 101 mL/min/{1.73_m2} (ref >=60)
Globulin: 3.6 g/dL (calc) (ref 1.9–3.7)
Glucose, Bld: 101 mg/dL (ref 65–139)
Potassium: 3.9 mmol/L (ref 3.5–5.3)
Sodium: 139 mmol/L (ref 135–146)
TOTAL PROTEIN: 6.1 g/dL (ref 6.1–8.1)

## 2017-11-20 ENCOUNTER — Other Ambulatory Visit: Payer: Self-pay | Admitting: Radiology

## 2017-11-20 ENCOUNTER — Ambulatory Visit
Admission: RE | Admit: 2017-11-20 | Discharge: 2017-11-20 | Disposition: A | Discharge status: Home / Self Care | Payer: Managed Care, Other (non HMO) | Source: Ambulatory Visit | Attending: Interventional Radiology | Admitting: Interventional Radiology

## 2017-11-20 ENCOUNTER — Encounter: Payer: Self-pay | Admitting: Radiology

## 2017-11-20 ENCOUNTER — Encounter: Payer: Self-pay | Admitting: Gastroenterology

## 2017-11-20 DIAGNOSIS — C220 Liver cell carcinoma: Secondary | ICD-10-CM

## 2017-11-20 HISTORY — PX: IR RADIOLOGIST EVAL & MGMT: IMG5224

## 2017-11-20 NOTE — Progress Notes (Signed)
Patient ID: [name], female   DOB: 27-Aug-1955, 62 y.o.   MRN: [id number]         Chief Complaint: Post Y-90 radioembolization  Referring Physician(s): Lennie Odor (Oncology) Danis (GI)  History of Present Illness: Dorthey Depace is a 62 y.o. female with past no history significant for hepatitis C cirrhosis who underwent technically successful Y 90 radioembolization of the right lobe of the liver on 11/02/2017.  She presents today for postprocedural evaluation and management.  She is again unaccompanied and serves as her own historian.  Unfortunately, the patient has experienced a rocky postprocedural course complaining of abdominal bloating and distention as well as worsening postprocedural constipation.  She also admits to a poor appetite and worsening fatigue.  Patient states that the symptoms were most severe at the end of last week and have improved over the past several days.  Patient denies yellowing of the skin or eyes.  No change in mental status.   Past Medical History:  Diagnosis Date  . Allergic rhinitis   . Chronic viral hepatitis C ([place]) 04/27/2017   with cirrhosis  . Depression    in the past, no meds, no previous hospitalization   . Heart murmur     Past Surgical History:  Procedure Laterality Date  . IR ANGIOGRAM SELECTIVE EACH ADDITIONAL VESSEL  10/19/2017  . IR ANGIOGRAM SELECTIVE EACH ADDITIONAL VESSEL  10/19/2017  . IR ANGIOGRAM SELECTIVE EACH ADDITIONAL VESSEL  10/19/2017  . IR ANGIOGRAM SELECTIVE EACH ADDITIONAL VESSEL  10/19/2017  . IR ANGIOGRAM SELECTIVE EACH ADDITIONAL VESSEL  11/02/2017  . IR ANGIOGRAM SELECTIVE EACH ADDITIONAL VESSEL  11/02/2017  . IR ANGIOGRAM SELECTIVE EACH ADDITIONAL VESSEL  11/02/2017  . IR ANGIOGRAM VISCERAL SELECTIVE  10/19/2017  . IR ANGIOGRAM VISCERAL SELECTIVE  10/19/2017  . IR ANGIOGRAM VISCERAL SELECTIVE  11/02/2017  . IR EMBO ARTERIAL NOT HEMORR HEMANG INC GUIDE ROADMAPPING  10/19/2017  . IR EMBO TUMOR ORGAN  ISCHEMIA INFARCT INC GUIDE ROADMAPPING  11/02/2017  . IR RADIOLOGIST EVAL & MGMT  09/18/2017  . IR RADIOLOGIST EVAL & MGMT  11/20/2017  . IR US GUIDE VASC ACCESS RIGHT  10/19/2017  . IR US GUIDE VASC ACCESS RIGHT  11/02/2017    Allergies: Patient has no known allergies.  Medications: Prior to Admission medications   Medication Sig Start Date End Date Taking? Authorizing Provider  acetaminophen (TYLENOL) 500 MG tablet Take 1,000 mg by mouth every 6 (six) hours as needed.    [provider]     Family History  Problem Relation Age of Onset  . Migraines Mother   . Cancer Father        biological father with lung to brain   . Migraines Maternal Grandmother     Social History   Socioeconomic History  . Marital status: Married    Spouse name: Not on file  . Number of children: 0  . Years of education: Not on file  . Highest education level: Not on file  Occupational History  . Occupation: Geophysicist/field seismologist, Hydrologist    Comment: full time   Social Needs  . Financial resource strain: Not on file  . Food insecurity:    Worry: Not on file    Inability: Not on file  . Transportation needs:    Medical: Not on file    Non-medical: Not on file  Tobacco Use  . Smoking status: Current Some Day Smoker    Packs/day: 0.25    Years: 30.00  Pack years: 7.50    Types: Cigarettes  . Smokeless tobacco: Never Used  Substance and Sexual Activity  . Alcohol use: Yes    Comment: occassional 2 beers a month; never heavy use  . Drug use: No    Comment: remote h/o IV drug use, none in decades   . Sexual activity: Not Currently  Lifestyle  . Physical activity:    Days per week: Not on file    Minutes per session: Not on file  . Stress: Not on file  Relationships  . Social connections:    Talks on phone: Not on file    Gets together: Not on file    Attends religious service: Not on file    Active member of club or organization: Not on file    Attends meetings of clubs or  organizations: Not on file    Relationship status: Not on file  Other Topics Concern  . Not on file  Social History Narrative  . Not on file    ECOG Status: 1 - Symptomatic but completely ambulatory  Review of Systems: A 12 point ROS discussed and pertinent positives are indicated in the HPI above.  All other systems are negative.  Review of Systems  Constitutional: Positive for activity change, appetite change and fatigue. Negative for chills and fever.  Gastrointestinal: Positive for abdominal distention, constipation and nausea.  Skin: Negative for color change.  Psychiatric/Behavioral: Negative for confusion.    Vital Signs: BP (!) 141/69   Pulse 76   Temp 97.8 F (36.6 C) (Oral)   Resp 16   Ht '5\' 11"'$  (1.803 m)   Wt 87.5 kg   SpO2 97%   BMI 26.92 kg/m   Physical Exam  Constitutional: She appears well-developed and well-nourished.  HENT:  Head: Normocephalic and atraumatic.  Eyes: Conjunctivae are normal.  Abdominal:  Mild abdominal distention.  Psoriasis plaque about the navel, pre-existing per the patient.  Psychiatric: She has a normal mood and affect. Her behavior is normal. Thought content normal.  Nursing note and vitals reviewed.   Intraoffice ascites search ultrasound demonstrates a small amount of intra-abdominal ascites, most conspicuous within the left lower abdomen.  Ultrasound-guided paracentesis was offered to the patient however the patient states that her abdominal bloating has improved during the past several days and at this time does not wish to proceed with ultrasound-guided paracentesis.  Imaging:  Selected images from preprocedural abdominal MRI performed 08/10/2017 as well as CT abdomen and pelvis performed 07/07/2017, intraprocedural images from mapping Y 90 radioembolization performed 10/19/2017 as well as Y-90 radioembolization of the right lobe of liver performed 10/18 /2019 reviewed in detail with the patient.   Nm Liver Img  Spect  Result Date: 11/02/2017 CLINICAL DATA:  Unresectable multifocal hepatocellular carcinoma. First treatment to right lobe of liver. EXAM: NUCLEAR MEDICINE SPECIAL MED RAD PHYSICS CONS; NUCLEAR MEDICINE RADIO PHARM THERAPY INTRA ARTERIAL; NUCLEAR MEDICINE TREATMENT PROCEDURE; NUCLEAR MEDICINE LIVER SCAN TECHNIQUE: In conjunction with the interventional radiologist a Y-90 Microsphere dose was calculated utilizing body surface area formulation. Calculated dose equal 31.32 mCi. Pre therapy MAA liver SPECT scan and CTA were evaluated. Utilizing a microcatheter system, the hepatic artery was selected and Y-90 microspheres were delivered in fractionated aliquots. Radiopharmaceutical was delivered by the interventional radiologist and nuclear radiologist. The patient tolerated procedure well. No adverse effects were noted. Bremsstrahlung planar and SPECT imaging of the abdomen following intrahepatic arterial delivery of Y-90 microsphere was performed. RADIOPHARMACEUTICALS:  A total of 30 microcuries of  Y-90 Microspheres were at administered. COMPARISON:  MRI 08/10/2017 FINDINGS: Y - 90 microspheres therapy as above. First therapy to the right hepatic lobe. Bremsstrahlung planar and SPECT imaging of the abdomen following intrahepatic arterial delivery of Y-90 microsphere demonstrates radioactivity localized to the right hepatic lobe. No evidence of extrahepatic activity. IMPRESSION: Successful Y - 90 microsphere delivery for treatment of unresectable liver metastasis. First therapy to the right lobe. Bremssstrahlung scan demonstrates activity localized to right hepatic lobe with no extrahepatic activity identified. Electronically Signed   By: Kerby Moors M.D.   On: 11/02/2017 12:57   Ir Angiogram Visceral Selective  Result Date: 11/02/2017 INDICATION: History of HCC. Patient presents today for Y 90 treatment of the right lobe of the liver. EXAM: 1. SUPERIOR MESENTERIC ARTERIOGRAM 2. SELECTIVE  PANCREATICODUODENAL ARTERIOGRAM 3. SELECTIVE PROPER HEPATIC ARTERIOGRAM 4. SELECTIVE RIGHT HEPATIC ARTERIOGRAM 5. TRANSCATHETER YTTRIUM-90 RADIOEMBOLIZATION OF THE RIGHT LOBE OF THE LIVER VIA RIGHT HEPATIC ARTERY 6. ULTRASOUND GUIDED VASCULAR ACCESS OF THE RIGHT COMMON FEMORAL ARTERY COMPARISON:  Y 90 mapping procedure-10/19/2017; abdominal MRI-08/10/2017; CT abdomen and pelvis-07/13/2017 MEDICATIONS: Pepcid 20 mg IV; Decadron 20 mg IV; Zofran 4 mg IV Zosyn 3.375 g IV; the antibiotic was administered with an appropriate time frame prior to the initiation of the procedure. CONTRAST:  75 cc Isovue 300 ANESTHESIA/SEDATION: Moderate (conscious) sedation was employed during this procedure. A total of Versed 2 mg and Fentanyl 100 mcg was administered intravenously. Moderate Sedation Time: 54 minutes. The patient's level of consciousness and vital signs were monitored continuously by radiology nursing throughout the procedure under my direct supervision. FLUOROSCOPY TIME:  11 minutes, 12 seconds (691 mGy) ACCESS: Right common femoral artery; hemostasis achieved with manual compression. COMPLICATIONS: None immediate. TECHNIQUE: Informed written consent was obtained from the patient after a discussion of the risks, benefits and alternatives to treatment. Questions regarding the procedure were encouraged and answered. A timeout was performed prior to the initiation of the procedure. The right groin was prepped and draped in the usual sterile fashion, and a sterile drape was applied covering the operative field. Maximum barrier sterile technique with sterile gowns and gloves were used for the procedure. A timeout was performed prior to the initiation of the procedure. Local anesthesia was provided with 1% lidocaine. The right femoral head was marked fluoroscopically. Under direct ultrasound guidance, the right common femoral artery was accessed with a micropuncture kit allowing placement of a of 5-French vascular sheath. An  ultrasound image was saved for documentation purposes. A limited arteriogram performed through the side arm of the sheath confirming appropriate access within the right common femoral artery. Over a Britta Mccreedy wire, a Mickelson catheter was advanced to the level of the inferior thoracic aorta where it was reformed, back bled and flushed. The Mickelson catheter was utilized to select the superior mesenteric artery and a selective superior mesenteric arteriogram was performed. Next, with the use of a fathom 16 microwire, a high-flow Renegade microcatheter was advanced into the pancreaticoduodenal artery and a selective pancreaticoduodenal arteriogram was performed. The microcatheter was then advanced through the pancreaticoduodenal artery, GDA and into the proper hepatic artery and a selective proper hepatic arteriogram was performed The microcatheter was advanced further to select the right hepatic artery and selective right hepatic arteriogram was performed. Microcatheter was then advanced to the level the right hepatic artery's bifurcation. Contrast injection was performed confirming appropriate location for radioembolization. Radioembolization was then performed with Yttrium-90 SIR Spheres. Particles were administered via a microcatheter utilizing a completely enclosed system. Monitoring of  antegrade flow was performed during and after the administration under fluoroscopy with use of contrast intermittently. After the administration of the particles, the microcatheter, outer catheter and administration system were then discarded into radiation safety receptacle. At this point, the procedure was terminated. The right common femoral approach vascular sheath was removed and hemostasis was achieved with manual compression. A dressing was placed. The patient tolerated procedure well without immediate postprocedural complication. All participants involved with the procedure were surveyed by the radiation safety officer  prior to exiting the fluoroscopy suite. The patient was escorted to the nuclear medicine department for post-treatment imaging. 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 Selective pancreaticoduodenal arteriogram confirms this finding. Selective proper hepatic arteriogram demonstrates separate origins of the medial and lateral segmental branches of the left hepatic artery with a common trunk of the right hepatic artery supplying both the anterior and posterior divisions. Selective right hepatic arteriogram confirms this finding. Technically successful fluoroscopic guided administration of complete calculated dose of Y 90 from the level just proximal to the right hepatic artery's bifurcation. Intermittent contrast injections during and after the radioembolization confirming preserved antegrade flow without reflux. IMPRESSION: Technically successful radioembolization of the right lobe of the liver with Yttrium-90 microspheres. PLAN: - The patient will be transferred to the nuclear medicine department for postprocedural imaging. - The patient will be seen in follow-up consultation in the IR clinic in 3-4 weeks, following the acquisition of a postprocedural CMP. At that time, appropriateness and feasibility of selective Y 90 radioembolization of the lateral segment of the left lobe of the liver supplying the two additional worrisome lesions within segment 2 of the left lobe of the liver will be considered. Electronically Signed   By: Sandi Mariscal M.D.   On: 11/02/2017 11:46   Ir Angiogram Selective Each Additional Vessel  Result Date: 11/02/2017 INDICATION: History of HCC. Patient presents today for Y 90 treatment of the right lobe of the liver. EXAM: 1. SUPERIOR MESENTERIC ARTERIOGRAM 2.  SELECTIVE PANCREATICODUODENAL ARTERIOGRAM 3. SELECTIVE PROPER HEPATIC ARTERIOGRAM 4. SELECTIVE RIGHT HEPATIC ARTERIOGRAM 5. TRANSCATHETER YTTRIUM-90 RADIOEMBOLIZATION OF THE RIGHT LOBE OF THE LIVER VIA RIGHT HEPATIC ARTERY 6. ULTRASOUND GUIDED VASCULAR ACCESS OF THE RIGHT COMMON FEMORAL ARTERY COMPARISON:  Y 90 mapping procedure-10/19/2017; abdominal MRI-08/10/2017; CT abdomen and pelvis-07/13/2017 MEDICATIONS: Pepcid 20 mg IV; Decadron 20 mg IV; Zofran 4 mg IV Zosyn 3.375 g IV; the antibiotic was administered with an appropriate time frame prior to the initiation of the procedure. CONTRAST:  75 cc Isovue 300 ANESTHESIA/SEDATION: Moderate (conscious) sedation was employed during this procedure. A total of Versed 2 mg and Fentanyl 100 mcg was administered intravenously. Moderate Sedation Time: 54 minutes. The patient's level of consciousness and vital signs were monitored continuously by radiology nursing throughout the procedure under my direct supervision. FLUOROSCOPY TIME:  11 minutes, 12 seconds (691 mGy) ACCESS: Right common femoral artery; hemostasis achieved with manual compression. COMPLICATIONS: None immediate. TECHNIQUE: Informed written consent was obtained from the patient after a discussion of the risks, benefits and alternatives to treatment. Questions regarding the procedure were encouraged and answered. A timeout was performed prior to the initiation of the procedure. The right groin was prepped and draped in the usual sterile fashion, and a sterile drape was applied covering the operative field. Maximum barrier sterile technique with sterile gowns and gloves were used for the procedure.  A timeout was performed prior to the initiation of the procedure. Local anesthesia was provided with 1% lidocaine. The right femoral head was marked fluoroscopically. Under direct ultrasound guidance, the right common femoral artery was accessed with a micropuncture kit allowing placement of a of 5-French vascular  sheath. An ultrasound image was saved for documentation purposes. A limited arteriogram performed through the side arm of the sheath confirming appropriate access within the right common femoral artery. Over a Britta Mccreedy wire, a Mickelson catheter was advanced to the level of the inferior thoracic aorta where it was reformed, back bled and flushed. The Mickelson catheter was utilized to select the superior mesenteric artery and a selective superior mesenteric arteriogram was performed. Next, with the use of a fathom 16 microwire, a high-flow Renegade microcatheter was advanced into the pancreaticoduodenal artery and a selective pancreaticoduodenal arteriogram was performed. The microcatheter was then advanced through the pancreaticoduodenal artery, GDA and into the proper hepatic artery and a selective proper hepatic arteriogram was performed The microcatheter was advanced further to select the right hepatic artery and selective right hepatic arteriogram was performed. Microcatheter was then advanced to the level the right hepatic artery's bifurcation. Contrast injection was performed confirming appropriate location for radioembolization. Radioembolization was then performed with Yttrium-90 SIR Spheres. Particles were administered via a microcatheter utilizing a completely enclosed system. Monitoring of antegrade flow was performed during and after the administration under fluoroscopy with use of contrast intermittently. After the administration of the particles, the microcatheter, outer catheter and administration system were then discarded into radiation safety receptacle. At this point, the procedure was terminated. The right common femoral approach vascular sheath was removed and hemostasis was achieved with manual compression. A dressing was placed. The patient tolerated procedure well without immediate postprocedural complication. All participants involved with the procedure were surveyed by the radiation safety  officer prior to exiting the fluoroscopy suite. The patient was escorted to the nuclear medicine department for post-treatment imaging. 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 Selective pancreaticoduodenal arteriogram confirms this finding. Selective proper hepatic arteriogram demonstrates separate origins of the medial and lateral segmental branches of the left hepatic artery with a common trunk of the right hepatic artery supplying both the anterior and posterior divisions. Selective right hepatic arteriogram confirms this finding. Technically successful fluoroscopic guided administration of complete calculated dose of Y 90 from the level just proximal to the right hepatic artery's bifurcation. Intermittent contrast injections during and after the radioembolization confirming preserved antegrade flow without reflux. IMPRESSION: Technically successful radioembolization of the right lobe of the liver with Yttrium-90 microspheres. PLAN: - The patient will be transferred to the nuclear medicine department for postprocedural imaging. - The patient will be seen in follow-up consultation in the IR clinic in 3-4 weeks, following the acquisition of a postprocedural CMP. At that time, appropriateness and feasibility of selective Y 90 radioembolization of the lateral segment of the left lobe of the liver supplying the two additional worrisome lesions within segment 2 of the left lobe of the liver will be considered. Electronically Signed   By: Sandi Mariscal M.D.   On: 11/02/2017 11:46   Ir Angiogram Selective Each Additional Vessel  Result Date: 11/02/2017 INDICATION: History of HCC. Patient presents today for Y 90 treatment of the right lobe of the liver. EXAM: 1. SUPERIOR MESENTERIC  ARTERIOGRAM 2. SELECTIVE PANCREATICODUODENAL ARTERIOGRAM 3.  SELECTIVE PROPER HEPATIC ARTERIOGRAM 4. SELECTIVE RIGHT HEPATIC ARTERIOGRAM 5. TRANSCATHETER YTTRIUM-90 RADIOEMBOLIZATION OF THE RIGHT LOBE OF THE LIVER VIA RIGHT HEPATIC ARTERY 6. ULTRASOUND GUIDED VASCULAR ACCESS OF THE RIGHT COMMON FEMORAL ARTERY COMPARISON:  Y 90 mapping procedure-10/19/2017; abdominal MRI-08/10/2017; CT abdomen and pelvis-07/13/2017 MEDICATIONS: Pepcid 20 mg IV; Decadron 20 mg IV; Zofran 4 mg IV Zosyn 3.375 g IV; the antibiotic was administered with an appropriate time frame prior to the initiation of the procedure. CONTRAST:  75 cc Isovue 300 ANESTHESIA/SEDATION: Moderate (conscious) sedation was employed during this procedure. A total of Versed 2 mg and Fentanyl 100 mcg was administered intravenously. Moderate Sedation Time: 54 minutes. The patient's level of consciousness and vital signs were monitored continuously by radiology nursing throughout the procedure under my direct supervision. FLUOROSCOPY TIME:  11 minutes, 12 seconds (691 mGy) ACCESS: Right common femoral artery; hemostasis achieved with manual compression. COMPLICATIONS: None immediate. TECHNIQUE: Informed written consent was obtained from the patient after a discussion of the risks, benefits and alternatives to treatment. Questions regarding the procedure were encouraged and answered. A timeout was performed prior to the initiation of the procedure. The right groin was prepped and draped in the usual sterile fashion, and a sterile drape was applied covering the operative field. Maximum barrier sterile technique with sterile gowns and gloves were used for the procedure. A timeout was performed prior to the initiation of the procedure. Local anesthesia was provided with 1% lidocaine. The right femoral head was marked fluoroscopically. Under direct ultrasound guidance, the right common femoral artery was accessed with a micropuncture kit allowing placement of a of  5-French vascular sheath. An ultrasound image was saved for documentation purposes. A limited arteriogram performed through the side arm of the sheath confirming appropriate access within the right common femoral artery. Over a Britta Mccreedy wire, a Mickelson catheter was advanced to the level of the inferior thoracic aorta where it was reformed, back bled and flushed. The Mickelson catheter was utilized to select the superior mesenteric artery and a selective superior mesenteric arteriogram was performed. Next, with the use of a fathom 16 microwire, a high-flow Renegade microcatheter was advanced into the pancreaticoduodenal artery and a selective pancreaticoduodenal arteriogram was performed. The microcatheter was then advanced through the pancreaticoduodenal artery, GDA and into the proper hepatic artery and a selective proper hepatic arteriogram was performed The microcatheter was advanced further to select the right hepatic artery and selective right hepatic arteriogram was performed. Microcatheter was then advanced to the level the right hepatic artery's bifurcation. Contrast injection was performed confirming appropriate location for radioembolization. Radioembolization was then performed with Yttrium-90 SIR Spheres. Particles were administered via a microcatheter utilizing a completely enclosed system. Monitoring of antegrade flow was performed during and after the administration under fluoroscopy with use of contrast intermittently. After the administration of the particles, the microcatheter, outer catheter and administration system were then discarded into radiation safety receptacle. At this point, the procedure was terminated. The right common femoral approach vascular sheath was removed and hemostasis was achieved with manual compression. A dressing was placed. The patient tolerated procedure well without immediate postprocedural complication. All participants involved with the procedure were surveyed by the  radiation safety officer prior to exiting the fluoroscopy suite. The patient was escorted to the nuclear medicine department for post-treatment imaging. 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 Selective pancreaticoduodenal  arteriogram confirms this finding. Selective proper hepatic arteriogram demonstrates separate origins of the medial and lateral segmental branches of the left hepatic artery with a common trunk of the right hepatic artery supplying both the anterior and posterior divisions. Selective right hepatic arteriogram confirms this finding. Technically successful fluoroscopic guided administration of complete calculated dose of Y 90 from the level just proximal to the right hepatic artery's bifurcation. Intermittent contrast injections during and after the radioembolization confirming preserved antegrade flow without reflux. IMPRESSION: Technically successful radioembolization of the right lobe of the liver with Yttrium-90 microspheres. PLAN: - The patient will be transferred to the nuclear medicine department for postprocedural imaging. - The patient will be seen in follow-up consultation in the IR clinic in 3-4 weeks, following the acquisition of a postprocedural CMP. At that time, appropriateness and feasibility of selective Y 90 radioembolization of the lateral segment of the left lobe of the liver supplying the two additional worrisome lesions within segment 2 of the left lobe of the liver will be considered. Electronically Signed   By: Sandi Mariscal M.D.   On: 11/02/2017 11:46   Ir Angiogram Selective Each Additional Vessel  Result Date: 11/02/2017 INDICATION: History of HCC. Patient presents today for Y 90 treatment of the right lobe of the liver. EXAM: 1. SUPERIOR  MESENTERIC ARTERIOGRAM 2. SELECTIVE PANCREATICODUODENAL ARTERIOGRAM 3. SELECTIVE PROPER HEPATIC ARTERIOGRAM 4. SELECTIVE RIGHT HEPATIC ARTERIOGRAM 5. TRANSCATHETER YTTRIUM-90 RADIOEMBOLIZATION OF THE RIGHT LOBE OF THE LIVER VIA RIGHT HEPATIC ARTERY 6. ULTRASOUND GUIDED VASCULAR ACCESS OF THE RIGHT COMMON FEMORAL ARTERY COMPARISON:  Y 90 mapping procedure-10/19/2017; abdominal MRI-08/10/2017; CT abdomen and pelvis-07/13/2017 MEDICATIONS: Pepcid 20 mg IV; Decadron 20 mg IV; Zofran 4 mg IV Zosyn 3.375 g IV; the antibiotic was administered with an appropriate time frame prior to the initiation of the procedure. CONTRAST:  75 cc Isovue 300 ANESTHESIA/SEDATION: Moderate (conscious) sedation was employed during this procedure. A total of Versed 2 mg and Fentanyl 100 mcg was administered intravenously. Moderate Sedation Time: 54 minutes. The patient's level of consciousness and vital signs were monitored continuously by radiology nursing throughout the procedure under my direct supervision. FLUOROSCOPY TIME:  11 minutes, 12 seconds (691 mGy) ACCESS: Right common femoral artery; hemostasis achieved with manual compression. COMPLICATIONS: None immediate. TECHNIQUE: Informed written consent was obtained from the patient after a discussion of the risks, benefits and alternatives to treatment. Questions regarding the procedure were encouraged and answered. A timeout was performed prior to the initiation of the procedure. The right groin was prepped and draped in the usual sterile fashion, and a sterile drape was applied covering the operative field. Maximum barrier sterile technique with sterile gowns and gloves were used for the procedure. A timeout was performed prior to the initiation of the procedure. Local anesthesia was provided with 1% lidocaine. The right femoral head was marked fluoroscopically. Under direct ultrasound guidance, the right common femoral artery was accessed with a micropuncture kit allowing placement of  a of 5-French vascular sheath. An ultrasound image was saved for documentation purposes. A limited arteriogram performed through the side arm of the sheath confirming appropriate access within the right common femoral artery. Over a Britta Mccreedy wire, a Mickelson catheter was advanced to the level of the inferior thoracic aorta where it was reformed, back bled and flushed. The Mickelson catheter was utilized to select the superior mesenteric artery and a selective superior mesenteric arteriogram was performed. Next, with the use of a fathom 16 microwire, a high-flow Renegade microcatheter was advanced into the  pancreaticoduodenal artery and a selective pancreaticoduodenal arteriogram was performed. The microcatheter was then advanced through the pancreaticoduodenal artery, GDA and into the proper hepatic artery and a selective proper hepatic arteriogram was performed The microcatheter was advanced further to select the right hepatic artery and selective right hepatic arteriogram was performed. Microcatheter was then advanced to the level the right hepatic artery's bifurcation. Contrast injection was performed confirming appropriate location for radioembolization. Radioembolization was then performed with Yttrium-90 SIR Spheres. Particles were administered via a microcatheter utilizing a completely enclosed system. Monitoring of antegrade flow was performed during and after the administration under fluoroscopy with use of contrast intermittently. After the administration of the particles, the microcatheter, outer catheter and administration system were then discarded into radiation safety receptacle. At this point, the procedure was terminated. The right common femoral approach vascular sheath was removed and hemostasis was achieved with manual compression. A dressing was placed. The patient tolerated procedure well without immediate postprocedural complication. All participants involved with the procedure were surveyed by  the radiation safety officer prior to exiting the fluoroscopy suite. The patient was escorted to the nuclear medicine department for post-treatment imaging. 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 Selective pancreaticoduodenal arteriogram confirms this finding. Selective proper hepatic arteriogram demonstrates separate origins of the medial and lateral segmental branches of the left hepatic artery with a common trunk of the right hepatic artery supplying both the anterior and posterior divisions. Selective right hepatic arteriogram confirms this finding. Technically successful fluoroscopic guided administration of complete calculated dose of Y 90 from the level just proximal to the right hepatic artery's bifurcation. Intermittent contrast injections during and after the radioembolization confirming preserved antegrade flow without reflux. IMPRESSION: Technically successful radioembolization of the right lobe of the liver with Yttrium-90 microspheres. PLAN: - The patient will be transferred to the nuclear medicine department for postprocedural imaging. - The patient will be seen in follow-up consultation in the IR clinic in 3-4 weeks, following the acquisition of a postprocedural CMP. At that time, appropriateness and feasibility of selective Y 90 radioembolization of the lateral segment of the left lobe of the liver supplying the two additional worrisome lesions within segment 2 of the left lobe of the liver will be considered. Electronically Signed   By: Sandi Mariscal M.D.   On: 11/02/2017 11:46   Nm Special Med Rad Physics Cons  Result Date: 11/02/2017 CLINICAL DATA:  Unresectable multifocal hepatocellular carcinoma. First treatment to right lobe of liver. EXAM: NUCLEAR MEDICINE  SPECIAL MED RAD PHYSICS CONS; NUCLEAR MEDICINE RADIO PHARM THERAPY INTRA ARTERIAL; NUCLEAR MEDICINE TREATMENT PROCEDURE; NUCLEAR MEDICINE LIVER SCAN TECHNIQUE: In conjunction with the interventional radiologist a Y-90 Microsphere dose was calculated utilizing body surface area formulation. Calculated dose equal 31.32 mCi. Pre therapy MAA liver SPECT scan and CTA were evaluated. Utilizing a microcatheter system, the hepatic artery was selected and Y-90 microspheres were delivered in fractionated aliquots. Radiopharmaceutical was delivered by the interventional radiologist and nuclear radiologist. The patient tolerated procedure well. No adverse effects were noted. Bremsstrahlung planar and SPECT imaging of the abdomen following intrahepatic arterial delivery of Y-90 microsphere was performed. RADIOPHARMACEUTICALS:  A total of 30 microcuries of Y-90 Microspheres were at administered. COMPARISON:  MRI 08/10/2017 FINDINGS: Y - 90 microspheres therapy as above. First therapy to the right hepatic lobe. Bremsstrahlung planar and SPECT imaging of the abdomen following intrahepatic  arterial delivery of Y-90 microsphere demonstrates radioactivity localized to the right hepatic lobe. No evidence of extrahepatic activity. IMPRESSION: Successful Y - 90 microsphere delivery for treatment of unresectable liver metastasis. First therapy to the right lobe. Bremssstrahlung scan demonstrates activity localized to right hepatic lobe with no extrahepatic activity identified. Electronically Signed   By: Kerby Moors M.D.   On: 11/02/2017 12:57   Nm Special Treatment Procedure  Result Date: 11/02/2017 CLINICAL DATA:  Unresectable multifocal hepatocellular carcinoma. First treatment to right lobe of liver. EXAM: NUCLEAR MEDICINE SPECIAL MED RAD PHYSICS CONS; NUCLEAR MEDICINE RADIO PHARM THERAPY INTRA ARTERIAL; NUCLEAR MEDICINE TREATMENT PROCEDURE; NUCLEAR MEDICINE LIVER SCAN TECHNIQUE: In conjunction with the interventional  radiologist a Y-90 Microsphere dose was calculated utilizing body surface area formulation. Calculated dose equal 31.32 mCi. Pre therapy MAA liver SPECT scan and CTA were evaluated. Utilizing a microcatheter system, the hepatic artery was selected and Y-90 microspheres were delivered in fractionated aliquots. Radiopharmaceutical was delivered by the interventional radiologist and nuclear radiologist. The patient tolerated procedure well. No adverse effects were noted. Bremsstrahlung planar and SPECT imaging of the abdomen following intrahepatic arterial delivery of Y-90 microsphere was performed. RADIOPHARMACEUTICALS:  A total of 30 microcuries of Y-90 Microspheres were at administered. COMPARISON:  MRI 08/10/2017 FINDINGS: Y - 90 microspheres therapy as above. First therapy to the right hepatic lobe. Bremsstrahlung planar and SPECT imaging of the abdomen following intrahepatic arterial delivery of Y-90 microsphere demonstrates radioactivity localized to the right hepatic lobe. No evidence of extrahepatic activity. IMPRESSION: Successful Y - 90 microsphere delivery for treatment of unresectable liver metastasis. First therapy to the right lobe. Bremssstrahlung scan demonstrates activity localized to right hepatic lobe with no extrahepatic activity identified. Electronically Signed   By: Kerby Moors M.D.   On: 11/02/2017 12:57   Ir US Guide Vasc Access Right  Result Date: 11/02/2017 INDICATION: History of HCC. Patient presents today for Y 90 treatment of the right lobe of the liver. EXAM: 1. SUPERIOR MESENTERIC ARTERIOGRAM 2. SELECTIVE PANCREATICODUODENAL ARTERIOGRAM 3. SELECTIVE PROPER HEPATIC ARTERIOGRAM 4. SELECTIVE RIGHT HEPATIC ARTERIOGRAM 5. TRANSCATHETER YTTRIUM-90 RADIOEMBOLIZATION OF THE RIGHT LOBE OF THE LIVER VIA RIGHT HEPATIC ARTERY 6. ULTRASOUND GUIDED VASCULAR ACCESS OF THE RIGHT COMMON FEMORAL ARTERY COMPARISON:  Y 90 mapping procedure-10/19/2017; abdominal MRI-08/10/2017; CT abdomen and  pelvis-07/13/2017 MEDICATIONS: Pepcid 20 mg IV; Decadron 20 mg IV; Zofran 4 mg IV Zosyn 3.375 g IV; the antibiotic was administered with an appropriate time frame prior to the initiation of the procedure. CONTRAST:  75 cc Isovue 300 ANESTHESIA/SEDATION: Moderate (conscious) sedation was employed during this procedure. A total of Versed 2 mg and Fentanyl 100 mcg was administered intravenously. Moderate Sedation Time: 54 minutes. The patient's level of consciousness and vital signs were monitored continuously by radiology nursing throughout the procedure under my direct supervision. FLUOROSCOPY TIME:  11 minutes, 12 seconds (691 mGy) ACCESS: Right common femoral artery; hemostasis achieved with manual compression. COMPLICATIONS: None immediate. TECHNIQUE: Informed written consent was obtained from the patient after a discussion of the risks, benefits and alternatives to treatment. Questions regarding the procedure were encouraged and answered. A timeout was performed prior to the initiation of the procedure. The right groin was prepped and draped in the usual sterile fashion, and a sterile drape was applied covering the operative field. Maximum barrier sterile technique with sterile gowns and gloves were used for the procedure. A timeout was performed prior to the initiation of the procedure. Local anesthesia was provided with 1% lidocaine. The right femoral  head was marked fluoroscopically. Under direct ultrasound guidance, the right common femoral artery was accessed with a micropuncture kit allowing placement of a of 5-French vascular sheath. An ultrasound image was saved for documentation purposes. A limited arteriogram performed through the side arm of the sheath confirming appropriate access within the right common femoral artery. Over a Britta Mccreedy wire, a Mickelson catheter was advanced to the level of the inferior thoracic aorta where it was reformed, back bled and flushed. The Mickelson catheter was utilized to  select the superior mesenteric artery and a selective superior mesenteric arteriogram was performed. Next, with the use of a fathom 16 microwire, a high-flow Renegade microcatheter was advanced into the pancreaticoduodenal artery and a selective pancreaticoduodenal arteriogram was performed. The microcatheter was then advanced through the pancreaticoduodenal artery, GDA and into the proper hepatic artery and a selective proper hepatic arteriogram was performed The microcatheter was advanced further to select the right hepatic artery and selective right hepatic arteriogram was performed. Microcatheter was then advanced to the level the right hepatic artery's bifurcation. Contrast injection was performed confirming appropriate location for radioembolization. Radioembolization was then performed with Yttrium-90 SIR Spheres. Particles were administered via a microcatheter utilizing a completely enclosed system. Monitoring of antegrade flow was performed during and after the administration under fluoroscopy with use of contrast intermittently. After the administration of the particles, the microcatheter, outer catheter and administration system were then discarded into radiation safety receptacle. At this point, the procedure was terminated. The right common femoral approach vascular sheath was removed and hemostasis was achieved with manual compression. A dressing was placed. The patient tolerated procedure well without immediate postprocedural complication. All participants involved with the procedure were surveyed by the radiation safety officer prior to exiting the fluoroscopy suite. The patient was escorted to the nuclear medicine department for post-treatment imaging. FINDINGS: Selective superior mesenteric arteriogram demonstrates persistent retrograde flow via the gastroduodenal artery supplying the celiac arterial vascular distribution secondary to a known hemodynamically significant stenosis involving the  origin the celiac artery. Selective pancreaticoduodenal arteriogram confirms this finding. Selective proper hepatic arteriogram demonstrates separate origins of the medial and lateral segmental branches of the left hepatic artery with a common trunk of the right hepatic artery supplying both the anterior and posterior divisions. Selective right hepatic arteriogram confirms this finding. Technically successful fluoroscopic guided administration of complete calculated dose of Y 90 from the level just proximal to the right hepatic artery's bifurcation. Intermittent contrast injections during and after the radioembolization confirming preserved antegrade flow without reflux. IMPRESSION: Technically successful radioembolization of the right lobe of the liver with Yttrium-90 microspheres. PLAN: - The patient will be transferred to the nuclear medicine department for postprocedural imaging. - The patient will be seen in follow-up consultation in the IR clinic in 3-4 weeks, following the acquisition of a postprocedural CMP. At that time, appropriateness and feasibility of selective Y 90 radioembolization of the lateral segment of the left lobe of the liver supplying the two additional worrisome lesions within segment 2 of the left lobe of the liver will be considered. Electronically Signed   By: Sandi Mariscal M.D.   On: 11/02/2017 11:46   Ir Embo Tumor Organ Ischemia Infarct Inc Guide Roadmapping  Result Date: 11/02/2017 INDICATION: History of HCC. Patient presents today for Y 90 treatment of the right lobe of the liver. EXAM: 1. SUPERIOR MESENTERIC ARTERIOGRAM 2. SELECTIVE PANCREATICODUODENAL ARTERIOGRAM 3. SELECTIVE PROPER HEPATIC ARTERIOGRAM 4. SELECTIVE RIGHT HEPATIC ARTERIOGRAM 5. TRANSCATHETER YTTRIUM-90 RADIOEMBOLIZATION OF THE RIGHT LOBE OF THE  LIVER VIA RIGHT HEPATIC ARTERY 6. ULTRASOUND GUIDED VASCULAR ACCESS OF THE RIGHT COMMON FEMORAL ARTERY COMPARISON:  Y 90 mapping procedure-10/19/2017; abdominal  MRI-08/10/2017; CT abdomen and pelvis-07/13/2017 MEDICATIONS: Pepcid 20 mg IV; Decadron 20 mg IV; Zofran 4 mg IV Zosyn 3.375 g IV; the antibiotic was administered with an appropriate time frame prior to the initiation of the procedure. CONTRAST:  75 cc Isovue 300 ANESTHESIA/SEDATION: Moderate (conscious) sedation was employed during this procedure. A total of Versed 2 mg and Fentanyl 100 mcg was administered intravenously. Moderate Sedation Time: 54 minutes. The patient's level of consciousness and vital signs were monitored continuously by radiology nursing throughout the procedure under my direct supervision. FLUOROSCOPY TIME:  11 minutes, 12 seconds (691 mGy) ACCESS: Right common femoral artery; hemostasis achieved with manual compression. COMPLICATIONS: None immediate. TECHNIQUE: Informed written consent was obtained from the patient after a discussion of the risks, benefits and alternatives to treatment. Questions regarding the procedure were encouraged and answered. A timeout was performed prior to the initiation of the procedure. The right groin was prepped and draped in the usual sterile fashion, and a sterile drape was applied covering the operative field. Maximum barrier sterile technique with sterile gowns and gloves were used for the procedure. A timeout was performed prior to the initiation of the procedure. Local anesthesia was provided with 1% lidocaine. The right femoral head was marked fluoroscopically. Under direct ultrasound guidance, the right common femoral artery was accessed with a micropuncture kit allowing placement of a of 5-French vascular sheath. An ultrasound image was saved for documentation purposes. A limited arteriogram performed through the side arm of the sheath confirming appropriate access within the right common femoral artery. Over a Britta Mccreedy wire, a Mickelson catheter was advanced to the level of the inferior thoracic aorta where it was reformed, back bled and flushed. The  Mickelson catheter was utilized to select the superior mesenteric artery and a selective superior mesenteric arteriogram was performed. Next, with the use of a fathom 16 microwire, a high-flow Renegade microcatheter was advanced into the pancreaticoduodenal artery and a selective pancreaticoduodenal arteriogram was performed. The microcatheter was then advanced through the pancreaticoduodenal artery, GDA and into the proper hepatic artery and a selective proper hepatic arteriogram was performed The microcatheter was advanced further to select the right hepatic artery and selective right hepatic arteriogram was performed. Microcatheter was then advanced to the level the right hepatic artery's bifurcation. Contrast injection was performed confirming appropriate location for radioembolization. Radioembolization was then performed with Yttrium-90 SIR Spheres. Particles were administered via a microcatheter utilizing a completely enclosed system. Monitoring of antegrade flow was performed during and after the administration under fluoroscopy with use of contrast intermittently. After the administration of the particles, the microcatheter, outer catheter and administration system were then discarded into radiation safety receptacle. At this point, the procedure was terminated. The right common femoral approach vascular sheath was removed and hemostasis was achieved with manual compression. A dressing was placed. The patient tolerated procedure well without immediate postprocedural complication. All participants involved with the procedure were surveyed by the radiation safety officer prior to exiting the fluoroscopy suite. The patient was escorted to the nuclear medicine department for post-treatment imaging. FINDINGS: Selective superior mesenteric arteriogram demonstrates persistent retrograde flow via the gastroduodenal artery supplying the celiac arterial vascular distribution secondary to a known hemodynamically  significant stenosis involving the origin the celiac artery. Selective pancreaticoduodenal arteriogram confirms this finding. Selective proper hepatic arteriogram demonstrates separate origins of the medial and lateral segmental branches  of the left hepatic artery with a common trunk of the right hepatic artery supplying both the anterior and posterior divisions. Selective right hepatic arteriogram confirms this finding. Technically successful fluoroscopic guided administration of complete calculated dose of Y 90 from the level just proximal to the right hepatic artery's bifurcation. Intermittent contrast injections during and after the radioembolization confirming preserved antegrade flow without reflux. IMPRESSION: Technically successful radioembolization of the right lobe of the liver with Yttrium-90 microspheres. PLAN: - The patient will be transferred to the nuclear medicine department for postprocedural imaging. - The patient will be seen in follow-up consultation in the IR clinic in 3-4 weeks, following the acquisition of a postprocedural CMP. At that time, appropriateness and feasibility of selective Y 90 radioembolization of the lateral segment of the left lobe of the liver supplying the two additional worrisome lesions within segment 2 of the left lobe of the liver will be considered. Electronically Signed   By: Sandi Mariscal M.D.   On: 11/02/2017 11:46   Ir Radiologist Eval & Mgmt  Result Date: 11/20/2017 Please refer to notes tab for details about interventional procedure. (Op Note)  Nm Radio Pharm Therapy Intraarterial  Result Date: 11/02/2017 CLINICAL DATA:  Unresectable multifocal hepatocellular carcinoma. First treatment to right lobe of liver. EXAM: NUCLEAR MEDICINE SPECIAL MED RAD PHYSICS CONS; NUCLEAR MEDICINE RADIO PHARM THERAPY INTRA ARTERIAL; NUCLEAR MEDICINE TREATMENT PROCEDURE; NUCLEAR MEDICINE LIVER SCAN TECHNIQUE: In conjunction with the interventional radiologist a Y-90 Microsphere  dose was calculated utilizing body surface area formulation. Calculated dose equal 31.32 mCi. Pre therapy MAA liver SPECT scan and CTA were evaluated. Utilizing a microcatheter system, the hepatic artery was selected and Y-90 microspheres were delivered in fractionated aliquots. Radiopharmaceutical was delivered by the interventional radiologist and nuclear radiologist. The patient tolerated procedure well. No adverse effects were noted. Bremsstrahlung planar and SPECT imaging of the abdomen following intrahepatic arterial delivery of Y-90 microsphere was performed. RADIOPHARMACEUTICALS:  A total of 30 microcuries of Y-90 Microspheres were at administered. COMPARISON:  MRI 08/10/2017 FINDINGS: Y - 90 microspheres therapy as above. First therapy to the right hepatic lobe. Bremsstrahlung planar and SPECT imaging of the abdomen following intrahepatic arterial delivery of Y-90 microsphere demonstrates radioactivity localized to the right hepatic lobe. No evidence of extrahepatic activity. IMPRESSION: Successful Y - 90 microsphere delivery for treatment of unresectable liver metastasis. First therapy to the right lobe. Bremssstrahlung scan demonstrates activity localized to right hepatic lobe with no extrahepatic activity identified. Electronically Signed   By: Kerby Moors M.D.   On: 11/02/2017 12:57    Labs:  CBC: Recent Labs    07/04/17 1531 10/19/17 0820 11/02/17 0827  WBC 4.5 3.5* 3.5*  HGB 13.3 13.3 12.4  HCT 40.1 39.6 38.1  PLT 70.0* 65* 63*    COAGS: Recent Labs    04/27/17 1124 10/19/17 0820 11/02/17 0827  INR 1.2* 1.13 1.25  APTT  --  36 36    BMP: Recent Labs    09/20/17 1232 10/19/17 0820 11/02/17 0827 11/13/17 1301  NA 137 141 140 139  K 3.8 4.1 4.5 3.9  CL 107 112* 112* 109  CO2 _0 GLUCOSE 132 98 92 101  BUN _1 CALCIUM 8.2* 8.4* 8.5* 7.9*  CREATININE 0.65 0.62 0.69 0.57  GFRNONAA 96 >60 >60 101  GFRAA 112 >60 >60 117    LIVER FUNCTION  TESTS: Recent Labs    03/12/17 1157  09/20/17 1232 10/19/17 0820 11/02/17 0827 11/13/17  1301  BILITOT 0.4  --  1.6* 1.6* 1.6* 3.1*  AST 83*  --  92* 110* 98* 106*  ALT 41*   < > 52* 64* 50* 47*  ALKPHOS 271*  --   --  226* 198*  --   PROT 7.0  --  7.0 6.8 6.5 6.1  ALBUMIN 3.2*  --   --  2.6* 2.5*  --    < > = values in this interval not displayed.    TUMOR MARKERS: Recent Labs    07/04/17 1531 09/20/17 1232  AFPTM 27.8* 21.8*    Assessment and Plan:  Sharnae Winfree is a 62 y.o. female with past medical history significant for hepatitis C cirrhosis who underwent technically successful Y 90 radioembolization of the right lobe of the liver on 11/02/2017.    Selected images from preprocedural abdominal MRI performed 08/10/2017 as well as CT of the abdomen and pelvis performed 07/07/2017, intraprocedural images from mapping Y-90 radioembolization performed 10/19/2017 as well as Y-90 radioembolization of the right lobe of liver performed 11/02/2017 were reviewed in detail with the patient.  As above, the patient has experienced a rocky postprocedural course complaining of abdominal bloating and distention as well as worsening postprocedural constipation.  She also admits to a poor appetite and worsening fatigue.    Unfortunately, I feel the majority of these symptoms are likely attributable to the patient's worsening LFTs following Y 90 radioembolization of the right lobe of the liver which was explained in great detail the patient.  I explained that that I am hopeful that the patient's transaminitis is transient and that her liver function tests will return to her preprocedural baseline but truthfully I am doubtful they will I will return to a level (< 2.0) where she would be a candidate for Y 90 radioembolization of the left lobe of the liver.  Intraoffice ascites search ultrasound demonstrates a small amount of intra-abdominal ascites, most conspicuous within the left lower abdomen.   Ultrasound-guided paracentesis was offered to the patient however the patient states that her abdominal bloating has improved during the past several days and at this time does not wish to proceed with ultrasound-guided paracentesis.  Plan:  - Given patient's elevated bilirubin level, the patient will not be scheduled for Y 90 embolization of the left lobe of the liver, and as detailed above, I am doubtful her LFTs will improve to allow her to undergo subsequent Y 90 radioembolization therapy.  If her portal vein remains patent, she may be a candidate for planned embolization however this treatment will be technically very difficult given her subtotal occlusion of the celiac artery.  - The patient will be seen in repeat consultation at the interventional radiology clinic following acquisition of postprocedural abdominal MRI 3 months following the Y90 radioembolization (mid January 2020).  AFP level will also be obtained at that time.  - We will obtain postprocedural CMP the end of next week in hopes of documenting improvement in patient's liver function tests.  If patient's LFTs remain persistently elevated, I will refer back to Dr. Loletha Carrow for repeat consultation and management.  -  The patient has been instructed to call the interventional radiology clinic if she would like to proceed with arrangement of attempted ultrasound-guided paracentesis.  Patient knows to call the interventional radiology clinic with any interval questions or concerns.  A copy of this report was sent to the requesting provider on this date.  Electronically Signed: Sandi Mariscal 11/20/2017, 10:43 AM   I spent  a total of 25 Minutes in face to face in clinical consultation, greater than 50% of which was counseling/coordinating care for post Y-90 radioembolization.

## 2017-11-27 LAB — COMPLETE METABOLIC PANEL WITH GFR
AG RATIO: 0.7 (calc) — AB (ref 1.0–2.5)
ALBUMIN MSPROF: 2.5 g/dL — AB (ref 3.6–5.1)
ALKALINE PHOSPHATASE (APISO): 210 U/L — AB (ref 33–130)
ALT: 48 U/L — ABNORMAL HIGH (ref 6–29)
AST: 91 U/L — ABNORMAL HIGH (ref 10–35)
BILIRUBIN TOTAL: 3 mg/dL — AB (ref 0.2–1.2)
BUN: 15 mg/dL (ref 7–25)
CHLORIDE: 107 mmol/L (ref 98–110)
CO2: 26 mmol/L (ref 20–32)
Calcium: 8.2 mg/dL — ABNORMAL LOW (ref 8.6–10.4)
Creat: 0.65 mg/dL (ref 0.50–0.99)
GFR, Est African American: 112 mL/min/{1.73_m2} (ref >=60)
GFR, Est Non African American: 96 mL/min/{1.73_m2} (ref >=60)
GLOBULIN: 3.4 g/dL (ref 1.9–3.7)
GLUCOSE: 129 mg/dL (ref 65–139)
POTASSIUM: 3.9 mmol/L (ref 3.5–5.3)
SODIUM: 137 mmol/L (ref 135–146)
Total Protein: 5.9 g/dL — ABNORMAL LOW (ref 6.1–8.1)

## 2017-11-29 ENCOUNTER — Other Ambulatory Visit (HOSPITAL_COMMUNITY): Payer: Self-pay | Admitting: Interventional Radiology

## 2017-11-29 ENCOUNTER — Telehealth: Payer: Self-pay | Admitting: Interventional Radiology

## 2017-11-29 ENCOUNTER — Other Ambulatory Visit (HOSPITAL_COMMUNITY): Payer: No Typology Code available for payment source

## 2017-11-29 ENCOUNTER — Ambulatory Visit (HOSPITAL_COMMUNITY): Payer: Managed Care, Other (non HMO)

## 2017-11-29 ENCOUNTER — Ambulatory Visit (HOSPITAL_COMMUNITY): Payer: No Typology Code available for payment source

## 2017-11-29 DIAGNOSIS — R188 Other ascites: Secondary | ICD-10-CM

## 2017-11-29 NOTE — Telephone Encounter (Signed)
Results of recent lab work discussed with the patient.  Repeat CMP demonstrates minimally improved though persistently elevated LFTs.  Patient continues to complain of abdominal distention and increased abdominal girth, unchanged to slightly worsened compared to most recent IR consultation.  As such, I will proceed with arranging for the patient to undergo therapeutic paracentesis tomorrow either at [facility] or [facility].  Will message Dr. Loletha Carrow (GI) to see if he wishes the ascitic fluid be sent for specific laboratories and if he feels the patient would benefit from albumin infusion at the to of the para.  Patient is in agreement with the above plan of care.  Ronny Bacon, MD Pager #: [phone]

## 2017-11-29 NOTE — Telephone Encounter (Signed)
She has had hepatic decompensation since recent intervention, but LFTs stable now.  Her creatinine and serum sodium are normal, so she does not need IV albumin administration as long as no more than 5 liters fluid removed.

## 2017-11-30 ENCOUNTER — Ambulatory Visit (HOSPITAL_COMMUNITY)
Admission: RE | Admit: 2017-11-30 | Discharge: 2017-11-30 | Disposition: A | Discharge status: Home / Self Care | Payer: Managed Care, Other (non HMO) | Source: Ambulatory Visit | Attending: Interventional Radiology | Admitting: Interventional Radiology

## 2017-11-30 DIAGNOSIS — R188 Other ascites: Secondary | ICD-10-CM | POA: Insufficient documentation

## 2017-11-30 MED ORDER — LIDOCAINE HCL 1 % IJ SOLN
INTRAMUSCULAR | Status: AC
  Filled 2017-11-30: qty 10

## 2017-11-30 NOTE — Procedures (Signed)
Ultrasound-guided  therapeutic paracentesis performed yielding 3 liters of yellow fluid. No immediate complications.  

## 2017-11-30 NOTE — Telephone Encounter (Signed)
They will most likely remain elevated.  I will check them again in a few weeks, and then oncology can check them again at January visit.  Renee Rice,    Please arrange hepatic function panel and INR in 3 weeks for cirrhosis and hepatocellular carcinoma.

## 2017-12-03 ENCOUNTER — Other Ambulatory Visit: Payer: Self-pay

## 2017-12-03 DIAGNOSIS — K7469 Other cirrhosis of liver: Secondary | ICD-10-CM

## 2017-12-03 DIAGNOSIS — C229 Malignant neoplasm of liver, not specified as primary or secondary: Secondary | ICD-10-CM

## 2017-12-03 NOTE — Telephone Encounter (Signed)
Spoke to patient and let her know about requested labs, mailed a reminder letter to her as well. Order in [place].

## 2017-12-04 ENCOUNTER — Encounter (HOSPITAL_COMMUNITY): Payer: Self-pay | Admitting: Radiology

## 2017-12-04 ENCOUNTER — Ambulatory Visit (HOSPITAL_COMMUNITY)
Admission: RE | Admit: 2017-12-04 | Discharge: 2017-12-04 | Disposition: A | Discharge status: Home / Self Care | Payer: Managed Care, Other (non HMO) | Source: Ambulatory Visit | Attending: Interventional Radiology | Admitting: Interventional Radiology

## 2017-12-04 ENCOUNTER — Other Ambulatory Visit (HOSPITAL_COMMUNITY): Payer: Self-pay | Admitting: Interventional Radiology

## 2017-12-04 DIAGNOSIS — C220 Liver cell carcinoma: Secondary | ICD-10-CM | POA: Insufficient documentation

## 2017-12-04 DIAGNOSIS — R180 Malignant ascites: Secondary | ICD-10-CM | POA: Insufficient documentation

## 2017-12-04 HISTORY — PX: IR PARACENTESIS: IMG2679

## 2017-12-04 MED ORDER — LIDOCAINE HCL 1 % IJ SOLN
INTRAMUSCULAR | Status: DC | PRN
  Administered 2017-12-04: 10 mL

## 2017-12-04 MED ORDER — LIDOCAINE HCL 1 % IJ SOLN
INTRAMUSCULAR | Status: AC
  Filled 2017-12-04: qty 20

## 2017-12-04 NOTE — Procedures (Signed)
   US guided RLQ paracentesis  3.2 L yellow fluid No labs per ordering MD  Tolerated well

## 2017-12-07 ENCOUNTER — Other Ambulatory Visit (HOSPITAL_COMMUNITY): Payer: Self-pay | Admitting: Interventional Radiology

## 2017-12-07 ENCOUNTER — Encounter (HOSPITAL_COMMUNITY): Payer: Self-pay | Admitting: Physician Assistant

## 2017-12-07 ENCOUNTER — Ambulatory Visit (HOSPITAL_COMMUNITY)
Admission: RE | Admit: 2017-12-07 | Discharge: 2017-12-07 | Disposition: A | Discharge status: Home / Self Care | Payer: Managed Care, Other (non HMO) | Source: Ambulatory Visit | Attending: Interventional Radiology | Admitting: Interventional Radiology

## 2017-12-07 DIAGNOSIS — R188 Other ascites: Secondary | ICD-10-CM

## 2017-12-07 DIAGNOSIS — B1920 Unspecified viral hepatitis C without hepatic coma: Secondary | ICD-10-CM | POA: Insufficient documentation

## 2017-12-07 DIAGNOSIS — K7460 Unspecified cirrhosis of liver: Secondary | ICD-10-CM | POA: Insufficient documentation

## 2017-12-07 DIAGNOSIS — C220 Liver cell carcinoma: Secondary | ICD-10-CM | POA: Insufficient documentation

## 2017-12-07 HISTORY — PX: IR PARACENTESIS: IMG2679

## 2017-12-07 MED ORDER — LIDOCAINE HCL 1 % IJ SOLN
INTRAMUSCULAR | Status: AC
  Filled 2017-12-07: qty 20

## 2017-12-07 MED ORDER — LIDOCAINE HCL 1 % IJ SOLN
INTRAMUSCULAR | Status: DC | PRN
  Administered 2017-12-07: 10 mL

## 2017-12-07 NOTE — Procedures (Signed)
PROCEDURE SUMMARY:  Successful US guided paracentesis from right lateral abdomen.  Yielded 2.7 L of clear yellow fluid.  No immediate complications.  Patient tolerated well.   EBL < 5 mL  Mabry Tift S Joslynn Jamroz PA-C 12/07/2017 4:28 PM

## 2017-12-09 ENCOUNTER — Other Ambulatory Visit: Payer: Self-pay

## 2017-12-09 ENCOUNTER — Observation Stay (HOSPITAL_COMMUNITY)
Admission: EM | Admit: 2017-12-09 | Discharge: 2017-12-10 | Disposition: A | Discharge status: Home / Self Care | Payer: Managed Care, Other (non HMO) | Attending: Internal Medicine | Admitting: Internal Medicine

## 2017-12-09 ENCOUNTER — Emergency Department (HOSPITAL_COMMUNITY): Payer: Managed Care, Other (non HMO)

## 2017-12-09 ENCOUNTER — Encounter (HOSPITAL_COMMUNITY): Payer: Self-pay | Admitting: Emergency Medicine

## 2017-12-09 DIAGNOSIS — R188 Other ascites: Secondary | ICD-10-CM | POA: Insufficient documentation

## 2017-12-09 DIAGNOSIS — F17210 Nicotine dependence, cigarettes, uncomplicated: Secondary | ICD-10-CM | POA: Insufficient documentation

## 2017-12-09 DIAGNOSIS — D696 Thrombocytopenia, unspecified: Secondary | ICD-10-CM | POA: Insufficient documentation

## 2017-12-09 DIAGNOSIS — F329 Major depressive disorder, single episode, unspecified: Secondary | ICD-10-CM | POA: Insufficient documentation

## 2017-12-09 DIAGNOSIS — F419 Anxiety disorder, unspecified: Secondary | ICD-10-CM | POA: Insufficient documentation

## 2017-12-09 DIAGNOSIS — R0601 Orthopnea: Secondary | ICD-10-CM

## 2017-12-09 DIAGNOSIS — Z79899 Other long term (current) drug therapy: Secondary | ICD-10-CM | POA: Insufficient documentation

## 2017-12-09 DIAGNOSIS — I700 Atherosclerosis of aorta: Secondary | ICD-10-CM | POA: Insufficient documentation

## 2017-12-09 DIAGNOSIS — C220 Liver cell carcinoma: Principal | ICD-10-CM | POA: Insufficient documentation

## 2017-12-09 DIAGNOSIS — K7460 Unspecified cirrhosis of liver: Secondary | ICD-10-CM | POA: Insufficient documentation

## 2017-12-09 DIAGNOSIS — B182 Chronic viral hepatitis C: Secondary | ICD-10-CM | POA: Insufficient documentation

## 2017-12-09 LAB — CBC WITH DIFFERENTIAL/PLATELET
ABS IMMATURE GRANULOCYTES: 0.02 10*3/uL (ref 0.00–0.07)
BASOS ABS: 0.1 10*3/uL (ref 0.0–0.1)
Basophils Relative: 1 %
Eosinophils Absolute: 0.2 10*3/uL (ref 0.0–0.5)
Eosinophils Relative: 4 %
HCT: 43.1 % (ref 36.0–46.0)
HEMOGLOBIN: 13.5 g/dL (ref 12.0–15.0)
IMMATURE GRANULOCYTES: 1 %
LYMPHS ABS: 0.5 10*3/uL — AB (ref 0.7–4.0)
LYMPHS PCT: 12 %
MCH: 31.3 pg (ref 26.0–34.0)
MCHC: 31.3 g/dL (ref 30.0–36.0)
MCV: 99.8 fL (ref 80.0–100.0)
Monocytes Absolute: 0.5 10*3/uL (ref 0.1–1.0)
Monocytes Relative: 12 %
NEUTROS ABS: 2.9 10*3/uL (ref 1.7–7.7)
NRBC: 0 % (ref 0.0–0.2)
Neutrophils Relative %: 70 %
Platelets: 63 10*3/uL — ABNORMAL LOW (ref 150–400)
RBC: 4.32 MIL/uL (ref 3.87–5.11)
RDW: 15 % (ref 11.5–15.5)
WBC: 4.1 10*3/uL (ref 4.0–10.5)

## 2017-12-09 LAB — COMPREHENSIVE METABOLIC PANEL
ALT: 47 U/L — AB (ref 0–44)
AST: 83 U/L — AB (ref 15–41)
Albumin: 2.3 g/dL — ABNORMAL LOW (ref 3.5–5.0)
Alkaline Phosphatase: 239 U/L — ABNORMAL HIGH (ref 38–126)
Anion gap: 6 (ref 5–15)
BUN: 12 mg/dL (ref 6–20)
CALCIUM: 8.3 mg/dL — AB (ref 8.9–10.3)
CHLORIDE: 108 mmol/L (ref 98–111)
CO2: 21 mmol/L — AB (ref 22–32)
CREATININE: 0.71 mg/dL (ref 0.44–1.00)
GFR calc non Af Amer: >60 mL/min (ref >60)
GLUCOSE: 119 mg/dL — AB (ref 70–99)
Potassium: 4.1 mmol/L (ref 3.5–5.1)
SODIUM: 135 mmol/L (ref 135–145)
Total Bilirubin: 2.7 mg/dL — ABNORMAL HIGH (ref 0.3–1.2)
Total Protein: 7 g/dL (ref 6.5–8.1)

## 2017-12-09 LAB — PROTIME-INR
INR: 1.48
Prothrombin Time: 17.7 seconds — ABNORMAL HIGH (ref 11.4–15.2)

## 2017-12-09 LAB — APTT: aPTT: 36 seconds (ref 24–36)

## 2017-12-09 MED ORDER — FUROSEMIDE 20 MG PO TABS
20.0000 mg | ORAL_TABLET | Freq: Every day | ORAL | Status: DC
  Administered 2017-12-09 – 2017-12-10 (×2): 20 mg via ORAL
  Filled 2017-12-09 (×2): qty 1

## 2017-12-09 MED ORDER — LORAZEPAM 2 MG/ML IJ SOLN
0.5000 mg | Freq: Once | INTRAMUSCULAR | Status: AC
  Administered 2017-12-09: 0.5 mg via INTRAVENOUS
  Filled 2017-12-09: qty 1

## 2017-12-09 MED ORDER — IOHEXOL 300 MG/ML  SOLN
100.0000 mL | Freq: Once | INTRAMUSCULAR | Status: AC | PRN
  Administered 2017-12-09: 100 mL via INTRAVENOUS

## 2017-12-09 MED ORDER — CALCIUM CARBONATE ANTACID 500 MG PO CHEW
1.0000 | CHEWABLE_TABLET | Freq: Every day | ORAL | Status: DC
  Administered 2017-12-10: 200 mg via ORAL
  Filled 2017-12-09: qty 1

## 2017-12-09 MED ORDER — SPIRONOLACTONE 50 MG PO TABS
50.0000 mg | ORAL_TABLET | Freq: Every day | ORAL | Status: DC
  Administered 2017-12-09 – 2017-12-10 (×2): 50 mg via ORAL
  Filled 2017-12-09 (×2): qty 1

## 2017-12-09 NOTE — ED Provider Notes (Addendum)
[facility] EMERGENCY DEPARTMENT Provider Note   CSN: [id number] Arrival date & time: 12/09/17  1239     History   Chief Complaint Chief Complaint  Patient presents with  . Abdominal Pain    HPI Renee Rice is a 62 y.o. female.  HPI Patient with history of cirrhosis and liver cancer presents with abdominal distention.  She is having some shortness of breath and anxiety.  She has had 3 paracentesis within the last 9 days.  She also claims she is having some difficulty passing bowel movements.  No nausea or vomiting.  No fever or chills. Past Medical History:  Diagnosis Date  . Allergic rhinitis   . Chronic viral hepatitis C ([place]) 04/27/2017   with cirrhosis  . Depression    in the past, no meds, no previous hospitalization   . Heart murmur     Patient Active Problem List   Diagnosis Date Noted  . Fibrosis of liver 05/17/2017  . Chronic viral hepatitis C ([place]) 04/27/2017  . Cigarette smoker 02/10/2017    Past Surgical History:  Procedure Laterality Date  . IR ANGIOGRAM SELECTIVE EACH ADDITIONAL VESSEL  10/19/2017  . IR ANGIOGRAM SELECTIVE EACH ADDITIONAL VESSEL  10/19/2017  . IR ANGIOGRAM SELECTIVE EACH ADDITIONAL VESSEL  10/19/2017  . IR ANGIOGRAM SELECTIVE EACH ADDITIONAL VESSEL  10/19/2017  . IR ANGIOGRAM SELECTIVE EACH ADDITIONAL VESSEL  11/02/2017  . IR ANGIOGRAM SELECTIVE EACH ADDITIONAL VESSEL  11/02/2017  . IR ANGIOGRAM SELECTIVE EACH ADDITIONAL VESSEL  11/02/2017  . IR ANGIOGRAM VISCERAL SELECTIVE  10/19/2017  . IR ANGIOGRAM VISCERAL SELECTIVE  10/19/2017  . IR ANGIOGRAM VISCERAL SELECTIVE  11/02/2017  . IR EMBO ARTERIAL NOT HEMORR HEMANG INC GUIDE ROADMAPPING  10/19/2017  . IR EMBO TUMOR ORGAN ISCHEMIA INFARCT INC GUIDE ROADMAPPING  11/02/2017  . IR PARACENTESIS  12/04/2017  . IR PARACENTESIS  12/07/2017  . IR RADIOLOGIST EVAL & MGMT  09/18/2017  . IR RADIOLOGIST EVAL & MGMT  11/20/2017  . IR US GUIDE VASC ACCESS RIGHT  10/19/2017  . IR US  GUIDE VASC ACCESS RIGHT  11/02/2017     OB History   None      Home Medications    Prior to Admission medications   Medication Sig Start Date End Date Taking? Authorizing Provider  acetaminophen (TYLENOL) 500 MG tablet Take 1,000 mg by mouth every 6 (six) hours as needed.    [provider]    Family History Family History  Problem Relation Age of Onset  . Migraines Mother   . Cancer Father        biological father with lung to brain   . Migraines Maternal Grandmother     Social History Social History   Tobacco Use  . Smoking status: Current Some Day Smoker    Packs/day: 0.25    Years: 30.00    Pack years: 7.50    Types: Cigarettes  . Smokeless tobacco: Never Used  Substance Use Topics  . Alcohol use: Yes    Comment: occassional 2 beers a month; never heavy use  . Drug use: No    Comment: remote h/o IV drug use, none in decades      Allergies   Patient has no known allergies.   Review of Systems Review of Systems  Constitutional: Negative for chills and fever.  HENT: Negative for sore throat.   Respiratory: Positive for shortness of breath. Negative for cough.   Cardiovascular: Positive for leg swelling. Negative for chest pain  and palpitations.  Gastrointestinal: Positive for abdominal distention and constipation. Negative for diarrhea, nausea and vomiting.  Genitourinary: Negative for dysuria, flank pain and frequency.  Musculoskeletal: Negative for back pain, myalgias and neck pain.  Skin: Negative for rash and wound.  Neurological: Negative for dizziness, weakness, light-headedness, numbness and headaches.  Psychiatric/Behavioral: The patient is nervous/anxious.   All other systems reviewed and are negative.    Physical Exam Updated Vital Signs BP 127/78   Pulse 92   Temp 97.8 F (36.6 C) (Oral)   Resp 18   Ht 5\' 11"  (1.803 m)   Wt 87.5 kg   SpO2 97%   BMI 26.92 kg/m   Physical Exam  Constitutional: She is oriented to person,  place, and time. She appears well-developed and well-nourished. No distress.  HENT:  Head: Normocephalic and atraumatic.  Mouth/Throat: Oropharynx is clear and moist. No oropharyngeal exudate.  Eyes: Pupils are equal, round, and reactive to light. EOM are normal.  Neck: Normal range of motion. Neck supple. No JVD present.  Cardiovascular: Normal rate and regular rhythm. Exam reveals no gallop and no friction rub.  No murmur heard. Pulmonary/Chest: Effort normal and breath sounds normal. No stridor. No respiratory distress. She has no wheezes. She has no rales. She exhibits no tenderness.  Abdominal: Soft. Bowel sounds are normal. She exhibits distension. There is no tenderness. There is no rebound and no guarding.  Abdomen is diffusely distended.  She does have mild right upper quadrant tenderness.  No fluid wave.  Musculoskeletal: Normal range of motion. She exhibits edema. She exhibits no tenderness.  2+ bilateral lower extremity pitting edema.  No midline thoracic or lumbar tenderness.  Neurological: She is alert and oriented to person, place, and time.  Moving all extremities without focal deficit.  Sensation intact.  Skin: Skin is warm and dry. No rash noted. She is not diaphoretic. No erythema.  Psychiatric:  Very anxious and tearful  Nursing note and vitals reviewed.    ED Treatments / Results  Labs (all labs ordered are listed, but only abnormal results are displayed) Labs Reviewed  CBC WITH DIFFERENTIAL/PLATELET - Abnormal; Notable for the following components:      Result Value   Platelets 63 (*)    Lymphs Abs 0.5 (*)    All other components within normal limits  COMPREHENSIVE METABOLIC PANEL - Abnormal; Notable for the following components:   CO2 21 (*)    Glucose, Bld 119 (*)    Calcium 8.3 (*)    Albumin 2.3 (*)    AST 83 (*)    ALT 47 (*)    Alkaline Phosphatase 239 (*)    Total Bilirubin 2.7 (*)    All other components within normal limits  PROTIME-INR -  Abnormal; Notable for the following components:   Prothrombin Time 17.7 (*)    All other components within normal limits  APTT    EKG None ED ECG REPORT   Date: 02/10/2018  Rate: 87  Rhythm: normal sinus rhythm  QRS Axis: normal  Intervals: normal  ST/T Wave abnormalities: normal  Conduction Disutrbances:none  Narrative Interpretation:   Old EKG Reviewed: none available  I have personally reviewed the EKG tracing and agree with the computerized printout as noted.  Radiology Dg Chest 2 View  Result Date: 12/09/2017 CLINICAL DATA:  Cirrhosis, abdominal distension EXAM: CHEST - 2 VIEW COMPARISON:  03/18/2009 FINDINGS: Normal heart size, mediastinal contours, and pulmonary vascularity. Minimal bibasilar atelectasis. Lungs otherwise clear. No pulmonary infiltrate, pleural effusion  or pneumothorax. Bones appear demineralized. IMPRESSION: Minimal bibasilar atelectasis. Electronically Signed   By: Lavonia Dana M.D.   On: 12/09/2017 14:16   Ct Abdomen Pelvis W Contrast  Result Date: 12/09/2017 CLINICAL DATA:  62 year old female with abdominal pain and distention. History of cirrhosis. EXAM: CT ABDOMEN AND PELVIS WITH CONTRAST TECHNIQUE: Multidetector CT imaging of the abdomen and pelvis was performed using the standard protocol following bolus administration of intravenous contrast. CONTRAST:  154mL OMNIPAQUE IOHEXOL 300 MG/ML  SOLN COMPARISON:  11/30/2017 ultrasound and 07/13/2017 CT FINDINGS: Lower chest: Mild bibasilar atelectasis noted. Hepatobiliary: Cirrhosis identified with heterogeneous hepatic parenchyma. No focal hepatic abnormalities are identified. Two separate 1 cm gallstones are identified. No biliary dilatation. Pancreas: Unremarkable Spleen: Mild splenomegaly is again noted with a splenic volume of 590 cc. No focal splenic abnormalities. Adrenals/Urinary Tract: The kidneys, adrenal glands and bladder are unremarkable. Stomach/Bowel: No bowel obstruction, bowel dilatation or  definite focal bowel wall thickening. The appendix is normal. Vascular/Lymphatic: Aortic atherosclerosis. No enlarged abdominal or pelvic lymph nodes. Reproductive: Uterus and bilateral adnexa are unremarkable. Other: A moderate to large amount of ascites within the abdomen and pelvis noted. Subcutaneous and mesenteric edema are identified. No evidence of pneumoperitoneum. Musculoskeletal: No acute or suspicious bony abnormalities. Compression of the L5 INFERIOR endplate is unchanged. IMPRESSION: 1. Cirrhosis, mild splenomegaly and moderate to large amount of ascites. 2. Mild bibasilar atelectasis 3. No other acute abnormalities 4.  Aortic Atherosclerosis (ICD10-I70.0). Electronically Signed   By: Margarette Canada M.D.   On: 12/09/2017 14:57   Ir Paracentesis  Result Date: 12/07/2017 INDICATION: Hepatitis-C, cirrhosis, multifocal hepatocellular carcinoma with prior hepatic Y-90 radioembolization, ascites. Request for therapeutic paracentesis. EXAM: ULTRASOUND GUIDED PARACENTESIS MEDICATIONS: 1% lidocaine 10 mL COMPLICATIONS: None immediate. PROCEDURE: Informed written consent was obtained from the patient after a discussion of the risks, benefits and alternatives to treatment. A timeout was performed prior to the initiation of the procedure. Initial ultrasound scanning demonstrates a moderate amount of ascites within the right lower abdominal quadrant. The right lower abdomen was prepped and draped in the usual sterile fashion. 1% lidocaine with epinephrine was used for local anesthesia. Following this, a 19 gauge, 10-cm, Yueh catheter was introduced. An ultrasound image was saved for documentation purposes. The paracentesis was performed. The catheter was removed and a dressing was applied. The patient tolerated the procedure well without immediate post procedural complication. FINDINGS: A total of approximately 2.7 L of clear yellow fluid was removed. IMPRESSION: Successful ultrasound-guided paracentesis yielding  2.7 liters of peritoneal fluid. Read by: Gareth Eagle, PA-C Electronically Signed   By: Jerilynn Mages.  Shick M.D.   On: 12/07/2017 16:28    Procedures Procedures (including critical care time)  Medications Ordered in ED Medications  LORazepam (ATIVAN) injection 0.5 mg (0.5 mg Intravenous Given 12/09/17 1314)  iohexol (OMNIPAQUE) 300 MG/ML solution 100 mL (100 mLs Intravenous Contrast Given 12/09/17 1406)     Initial Impression / Assessment and Plan / ED Course  I have reviewed the triage vital signs and the nursing notes.  Pertinent labs & imaging results that were available during my care of the patient were reviewed by me and considered in my medical decision making (see chart for details).     Large volume ascites on CT.  Discussed with hospitalist regarding admission for IR drainage.  Final Clinical Impressions(s) / ED Diagnoses   Final diagnoses:  Ascites of liver  Orthopnea    ED Discharge Orders    None  Julianne Rice, MD 12/09/17 1538    Julianne Rice, MD 02/10/18 [phone]

## 2017-12-09 NOTE — ED Triage Notes (Signed)
Patient reports cirrhosis with abdominal distention. States having to get fluid removed every 2 days from her abdomen. C/o difficulty breathing due to fluid. Patient appears anxious.

## 2017-12-09 NOTE — H&P (Addendum)
History and Physical    Renee Rice UKG:[id number] DOB: [birth year] DOA: 12/09/2017  I have briefly reviewed the patient's prior medical records in [facility]  PCP: System, Pcp Not In  Patient coming from: Home  Chief Complaint: Abdominal pain/abdominal distention  HPI: Renee Rice is a 62 y.o. female with medical history significant of hep C cirrhosis, recently diagnosed Renee Rice, who presents to the hospital with chief complaint of abdominal distention and discomfort.  Patient has a history of hep C cirrhosis followed by gastroenterology as an outpatient.  Over the last couple of weeks, she has been having recurrent and worsening abdominal distention needing to have a paracentesis every 3 days or so.  Most recent fluid removal was on 11/22, and in the last 48 hours she has noted that her abdomen is becoming quite distended and uncomfortable and decided to come back to the emergency room.  She denies any fever or chills, denies any abdominal pain per se but more of a discomfort in the setting of fluid.  She also reports that her reflux type symptoms have significantly worsened as she is getting more distended.  She denies any chest pain, denies any shortness of breath.  She denies any nausea vomiting or diarrhea.  She is also noticed slight lower extremity swelling around her ankles.  She is trying to see her primary gastroenterologist but has not done so yet, has an upcoming appointment.  She does report trying to eat healthy and is a low-salt diet  ED Course: In the ED her vital signs are stable, she is satting well on room air.  Blood work shows normal renal function, ALT is 47, AST 83 and total bilirubin is 2.7, slightly improved when compared to 12 days ago.  Her CBC is remarkable for decreased platelets of 63.  CT scan of the abdomen and pelvis showed cirrhosis, mild splenomegaly and moderate to large amount of ascites but nothing else acute.  We are asked to admit for recurrent  ascites.  Review of Systems: As per HPI otherwise 10 point review of systems negative.   Past Medical History:  Diagnosis Date  . Allergic rhinitis   . Chronic viral hepatitis C ([place]) 04/27/2017   with cirrhosis  . Depression    in the past, no meds, no previous hospitalization   . Heart murmur     Past Surgical History:  Procedure Laterality Date  . IR ANGIOGRAM SELECTIVE EACH ADDITIONAL VESSEL  10/19/2017  . IR ANGIOGRAM SELECTIVE EACH ADDITIONAL VESSEL  10/19/2017  . IR ANGIOGRAM SELECTIVE EACH ADDITIONAL VESSEL  10/19/2017  . IR ANGIOGRAM SELECTIVE EACH ADDITIONAL VESSEL  10/19/2017  . IR ANGIOGRAM SELECTIVE EACH ADDITIONAL VESSEL  11/02/2017  . IR ANGIOGRAM SELECTIVE EACH ADDITIONAL VESSEL  11/02/2017  . IR ANGIOGRAM SELECTIVE EACH ADDITIONAL VESSEL  11/02/2017  . IR ANGIOGRAM VISCERAL SELECTIVE  10/19/2017  . IR ANGIOGRAM VISCERAL SELECTIVE  10/19/2017  . IR ANGIOGRAM VISCERAL SELECTIVE  11/02/2017  . IR EMBO ARTERIAL NOT HEMORR HEMANG INC GUIDE ROADMAPPING  10/19/2017  . IR EMBO TUMOR ORGAN ISCHEMIA INFARCT INC GUIDE ROADMAPPING  11/02/2017  . IR PARACENTESIS  12/04/2017  . IR PARACENTESIS  12/07/2017  . IR RADIOLOGIST EVAL & MGMT  09/18/2017  . IR RADIOLOGIST EVAL & MGMT  11/20/2017  . IR US GUIDE VASC ACCESS RIGHT  10/19/2017  . IR US GUIDE VASC ACCESS RIGHT  11/02/2017     reports that she has been smoking cigarettes. She has a 7.50 pack-year smoking history.  She has never used smokeless tobacco. She reports that she drinks alcohol. She reports that she does not use drugs.  No Known Allergies  Family History  Problem Relation Age of Onset  . Migraines Mother   . Cancer Father        biological father with lung to brain   . Migraines Maternal Grandmother     Prior to Admission medications   Medication Sig Start Date End Date Taking? Authorizing Provider  acetaminophen (TYLENOL) 500 MG tablet Take 1,000 mg by mouth every 6 (six) hours as needed for mild pain.    Yes  [provider]  calcium carbonate (TUMS - DOSED IN MG ELEMENTAL CALCIUM) 500 MG chewable tablet Chew 1 tablet by mouth daily.   Yes [provider]    Physical Exam: Vitals:   12/09/17 1400 12/09/17 1430 12/09/17 1530 12/09/17 1600  BP: 125/87 127/78 131/87 129/80  Pulse: 89 92 100 91  Resp: 17 18 20 18   Temp:      TempSrc:      SpO2: 100% 97% 93% 96%  Weight:      Height:          Constitutional: NAD, calm, comfortable Eyes: PERRL, lids and conjunctivae normal ENMT: Mucous membranes are moist.  Neck: normal, supple Respiratory: clear to auscultation bilaterally, no wheezing, no crackles. Normal respiratory effort. No accessory muscle use.  Cardiovascular: Regular rate and rhythm, no murmurs / rubs / gallops.  1+ pitting lower extremity edema. 2+ pedal pulses.  Abdomen: Distended, ascites present, with no significant tenderness rebound or guarding on exam, bowel sounds distant Musculoskeletal: no clubbing / cyanosis. Normal muscle tone.  Skin: no rashes, lesions, ulcers. No induration Neurologic: CN 2-12 grossly intact. Strength 5/5 in all 4.  Psychiatric: Normal judgment and insight. Alert and oriented x 3. Normal mood.   Labs on Admission: I have personally reviewed following labs and imaging studies  CBC: Recent Labs  Lab 12/09/17 1249  WBC 4.1  NEUTROABS 2.9  HGB 13.5  HCT 43.1  MCV 99.8  PLT 63*   Basic Metabolic Panel: Recent Labs  Lab 12/09/17 1249  NA 135  K 4.1  CL 108  CO2 21*  GLUCOSE 119*  BUN 12  CREATININE 0.71  CALCIUM 8.3*   GFR: Estimated Creatinine Clearance: 91.5 mL/min (by C-G formula based on SCr of 0.71 mg/dL). Liver Function Tests: Recent Labs  Lab 12/09/17 1249  AST 83*  ALT 47*  ALKPHOS 239*  BILITOT 2.7*  PROT 7.0  ALBUMIN 2.3*   No results for input(s): LIPASE, AMYLASE in the last 168 hours. No results for input(s): AMMONIA in the last 168 hours. Coagulation Profile: Recent Labs  Lab 12/09/17 1249   INR 1.48   Cardiac Enzymes: No results for input(s): CKTOTAL, CKMB, CKMBINDEX, TROPONINI in the last 168 hours. BNP (last 3 results) No results for input(s): PROBNP in the last 8760 hours. HbA1C: No results for input(s): HGBA1C in the last 72 hours. CBG: No results for input(s): GLUCAP in the last 168 hours. Lipid Profile: No results for input(s): CHOL, HDL, LDLCALC, TRIG, CHOLHDL, LDLDIRECT in the last 72 hours. Thyroid Function Tests: No results for input(s): TSH, T4TOTAL, FREET4, T3FREE, THYROIDAB in the last 72 hours. Anemia Panel: No results for input(s): VITAMINB12, FOLATE, FERRITIN, TIBC, IRON, RETICCTPCT in the last 72 hours. Urine analysis:    Component Value Date/Time   COLORURINE YELLOW 03/18/2009 2104   APPEARANCEUR CLEAR 03/18/2009 2104   LABSPEC 1.014 03/18/2009 2104  PHURINE 6.5 03/18/2009 2104   [place] NEGATIVE 03/18/2009 2104   HGBUR NEGATIVE 03/18/2009 2104   BILIRUBINUR NEGATIVE 03/18/2009 2104   [place] NEGATIVE 03/18/2009 2104   PROTEINUR NEGATIVE 03/18/2009 2104   UROBILINOGEN 0.2 03/18/2009 2104   NITRITE NEGATIVE 03/18/2009 2104   LEUKOCYTESUR  03/18/2009 2104    NEGATIVE MICROSCOPIC NOT DONE ON URINES WITH NEGATIVE PROTEIN, BLOOD, LEUKOCYTES, NITRITE, OR GLUCOSE <1000 mg/dL.     Radiological Exams on Admission: Dg Chest 2 View  Result Date: 12/09/2017 CLINICAL DATA:  Cirrhosis, abdominal distension EXAM: CHEST - 2 VIEW COMPARISON:  03/18/2009 FINDINGS: Normal heart size, mediastinal contours, and pulmonary vascularity. Minimal bibasilar atelectasis. Lungs otherwise clear. No pulmonary infiltrate, pleural effusion or pneumothorax. Bones appear demineralized. IMPRESSION: Minimal bibasilar atelectasis. Electronically Signed   By: Lavonia Dana M.D.   On: 12/09/2017 14:16   Ct Abdomen Pelvis W Contrast  Result Date: 12/09/2017 CLINICAL DATA:  62 year old female with abdominal pain and distention. History of cirrhosis. EXAM: CT ABDOMEN AND PELVIS  WITH CONTRAST TECHNIQUE: Multidetector CT imaging of the abdomen and pelvis was performed using the standard protocol following bolus administration of intravenous contrast. CONTRAST:  135mL OMNIPAQUE IOHEXOL 300 MG/ML  SOLN COMPARISON:  11/30/2017 ultrasound and 07/13/2017 CT FINDINGS: Lower chest: Mild bibasilar atelectasis noted. Hepatobiliary: Cirrhosis identified with heterogeneous hepatic parenchyma. No focal hepatic abnormalities are identified. Two separate 1 cm gallstones are identified. No biliary dilatation. Pancreas: Unremarkable Spleen: Mild splenomegaly is again noted with a splenic volume of 590 cc. No focal splenic abnormalities. Adrenals/Urinary Tract: The kidneys, adrenal glands and bladder are unremarkable. Stomach/Bowel: No bowel obstruction, bowel dilatation or definite focal bowel wall thickening. The appendix is normal. Vascular/Lymphatic: Aortic atherosclerosis. No enlarged abdominal or pelvic lymph nodes. Reproductive: Uterus and bilateral adnexa are unremarkable. Other: A moderate to large amount of ascites within the abdomen and pelvis noted. Subcutaneous and mesenteric edema are identified. No evidence of pneumoperitoneum. Musculoskeletal: No acute or suspicious bony abnormalities. Compression of the L5 INFERIOR endplate is unchanged. IMPRESSION: 1. Cirrhosis, mild splenomegaly and moderate to large amount of ascites. 2. Mild bibasilar atelectasis 3. No other acute abnormalities 4.  Aortic Atherosclerosis (ICD10-I70.0). Electronically Signed   By: Margarette Canada M.D.   On: 12/09/2017 14:57    EKG: Independently reviewed.  Normal sinus rhythm  Assessment/Plan Active Problems:   Ascites   Decompensated cirrhosis with recurrent ascites -Patient is currently not on any diuretics, will start Lasix and spironolactone, counseled for low-salt diet.  Will ask IR to repeat paracentesis and provide albumin if they remove more than 5 L -Closely monitor renal function and potassium levels  while on diuretics -She has no history of hepatic encephalopathy, no history of heart failure, if her ascites proves to be refractory even to diuretics may benefit from gastroenterology consultation and consideration for TIPS  Thrombocytopenia -Due to cirrhosis, the platelets are stable and at her baseline  Tobacco use, in remission -Quit smoking several months ago  [facility] -managed as an outpatient   DVT prophylaxis: SCDs Code Status: Full code Family Communication: Husband present at bedside Disposition Plan: Admit to [place] Consults called: None   Marzetta Board, MD, PhD [facility] Pager [phone]  If 7PM-7AM, please contact night-coverage [email or web address] Password [facility]  12/09/2017, 4:33 PM

## 2017-12-09 NOTE — Progress Notes (Signed)
Renee Rice is a 62 y.o. female patient admitted. Awake, alert - oriented  X 4 - no acute distress noted.  VSS - Blood pressure (!) 121/92, pulse 94, temperature 97.8 F (36.6 C), temperature source Oral, resp. rate 19, height 5\' 11"  (1.803 m), weight 87.5 kg, SpO2 96 %.    IV in place, occlusive dsg intact without redness.  Orientation to room, and floor completed.  Admission INP armband ID verified with patient/family, and in place.   SR up x 2, fall assessment complete, with patient and family able to verbalize understanding of risk associated with falls, and verbalized understanding to call nsg before up out of bed.  Call light within reach, patient able to voice, and demonstrate understanding. No evidence of skin break down noted on exam.  Admission nurse notified of admission.     Will cont to eval and treat per MD orders.  Roger Kill, RN 12/09/2017 5:30 PM

## 2017-12-10 ENCOUNTER — Encounter (HOSPITAL_COMMUNITY): Payer: Self-pay | Admitting: Physician Assistant

## 2017-12-10 ENCOUNTER — Observation Stay (HOSPITAL_COMMUNITY): Payer: Managed Care, Other (non HMO)

## 2017-12-10 DIAGNOSIS — R0601 Orthopnea: Secondary | ICD-10-CM | POA: Diagnosis not present

## 2017-12-10 DIAGNOSIS — R188 Other ascites: Secondary | ICD-10-CM | POA: Diagnosis not present

## 2017-12-10 HISTORY — PX: IR PARACENTESIS: IMG2679

## 2017-12-10 LAB — CBC
HEMATOCRIT: 36.1 % (ref 36.0–46.0)
HEMOGLOBIN: 12 g/dL (ref 12.0–15.0)
MCH: 31.8 pg (ref 26.0–34.0)
MCHC: 33.2 g/dL (ref 30.0–36.0)
MCV: 95.8 fL (ref 80.0–100.0)
NRBC: 0 % (ref 0.0–0.2)
Platelets: 57 10*3/uL — ABNORMAL LOW (ref 150–400)
RBC: 3.77 MIL/uL — ABNORMAL LOW (ref 3.87–5.11)
RDW: 15.1 % (ref 11.5–15.5)
WBC: 3.4 10*3/uL — AB (ref 4.0–10.5)

## 2017-12-10 LAB — BODY FLUID CELL COUNT WITH DIFFERENTIAL
LYMPHS FL: 40 %
MONOCYTE-MACROPHAGE-SEROUS FLUID: 58 % (ref 50–90)
Neutrophil Count, Fluid: 2 % (ref 0–25)
WBC FLUID: 116 uL (ref 0–1000)

## 2017-12-10 LAB — COMPREHENSIVE METABOLIC PANEL
ALT: 39 U/L (ref 0–44)
AST: 70 U/L — AB (ref 15–41)
Albumin: 1.8 g/dL — ABNORMAL LOW (ref 3.5–5.0)
Alkaline Phosphatase: 171 U/L — ABNORMAL HIGH (ref 38–126)
Anion gap: 4 — ABNORMAL LOW (ref 5–15)
BUN: 12 mg/dL (ref 6–20)
CHLORIDE: 111 mmol/L (ref 98–111)
CO2: 20 mmol/L — ABNORMAL LOW (ref 22–32)
Calcium: 7.8 mg/dL — ABNORMAL LOW (ref 8.9–10.3)
Creatinine, Ser: 0.78 mg/dL (ref 0.44–1.00)
Glucose, Bld: 116 mg/dL — ABNORMAL HIGH (ref 70–99)
Potassium: 4.1 mmol/L (ref 3.5–5.1)
SODIUM: 135 mmol/L (ref 135–145)
Total Bilirubin: 2.7 mg/dL — ABNORMAL HIGH (ref 0.3–1.2)
Total Protein: 5.5 g/dL — ABNORMAL LOW (ref 6.5–8.1)

## 2017-12-10 LAB — GRAM STAIN: Gram Stain: NONE SEEN

## 2017-12-10 MED ORDER — ALBUMIN HUMAN 25 % IV SOLN
25.0000 g | Freq: Once | INTRAVENOUS | Status: AC
  Administered 2017-12-10: 25 g via INTRAVENOUS
  Filled 2017-12-10: qty 100

## 2017-12-10 MED ORDER — SPIRONOLACTONE 50 MG PO TABS: 50.0000 mg | ORAL_TABLET | Freq: Every day | ORAL | 0 refills | Status: DC

## 2017-12-10 MED ORDER — FUROSEMIDE 20 MG PO TABS: 20.0000 mg | ORAL_TABLET | Freq: Every day | ORAL | 0 refills | Status: DC

## 2017-12-10 MED ORDER — LIDOCAINE HCL 1 % IJ SOLN
INTRAMUSCULAR | Status: AC
  Filled 2017-12-10: qty 20

## 2017-12-10 NOTE — Discharge Summary (Signed)
Physician Discharge Summary  Renee Rice KGU:[id number] DOB: 02-10-1955 DOA: 12/09/2017  PCP: System, Pcp Not In  Admit date: 12/09/2017 Discharge date: 12/10/2017  Admitted From: Home Disposition:  Home  Recommendations for Outpatient Follow-up:  1. Follow up with PCP in 1-2 weeks 2. Follow up with GI as scheduled 3. Follow up with Oncology as scheduled 4. Please follow up on cytology of ascitic fluid from 11/25  Discharge Condition:Improved CODE STATUS:Full Diet recommendation: Low sodium  Brief/Interim Summary: 62 y.o. female with medical history significant of hep C cirrhosis, recently diagnosed [place], who presents to the hospital with chief complaint of abdominal distention and discomfort.  Patient has a history of hep C cirrhosis followed by gastroenterology as an outpatient.  Over the last couple of weeks, she has been having recurrent and worsening abdominal distention needing to have a paracentesis every 3 days or so.  Most recent fluid removal was on 11/22, and in the last 48 hours she has noted that her abdomen is becoming quite distended and uncomfortable and decided to come back to the emergency room.  She denies any fever or chills, denies any abdominal pain per se but more of a discomfort in the setting of fluid.  She also reports that her reflux type symptoms have significantly worsened as she is getting more distended.  She denies any chest pain, denies any shortness of breath.  She denies any nausea vomiting or diarrhea.  She is also noticed slight lower extremity swelling around her ankles.  She is trying to see her primary gastroenterologist but has not done so yet, has an upcoming appointment.  She does report trying to eat healthy and is a low-salt diet  ED Course: In the ED her vital signs are stable, she is satting well on room air.  Blood work shows normal renal function, ALT is 47, AST 83 and total bilirubin is 2.7, slightly improved when compared to 12 days ago.   Her CBC is remarkable for decreased platelets of 63.  CT scan of the abdomen and pelvis showed cirrhosis, mild splenomegaly and moderate to large amount of ascites but nothing else acute.  We are asked to admit for recurrent ascites  Discharge Diagnoses:  Active Problems:   Ascites  Decompensated cirrhosis with recurrent ascites -Patient is currently not on any diuretics, will start Lasix and spironolactone, counseled for low-salt diet.  -Patient underwent IR guided paracentesis, yielding 2.2L of fluid -No evidence of SBP on fluid analysis. Cytology requested, pending results. Would ask PCP to follow up on cytology results -She has no history of hepatic encephalopathy, no history of heart failure  Thrombocytopenia -Due to cirrhosis, the platelets are stable and at her baseline  Tobacco use, in remission -Quit smoking several months ago  [facility] -managed as an outpatient   Discharge Instructions   Allergies as of 12/10/2017   No Known Allergies     Medication List    TAKE these medications   acetaminophen 500 MG tablet Commonly known as:  TYLENOL Take 1,000 mg by mouth every 6 (six) hours as needed for mild pain.   calcium carbonate 500 MG chewable tablet Commonly known as:  TUMS - dosed in mg elemental calcium Chew 1 tablet by mouth daily.   furosemide 20 MG tablet Commonly known as:  LASIX Take 1 tablet (20 mg total) by mouth daily. Start taking on:  12/11/2017   spironolactone 50 MG tablet Commonly known as:  ALDACTONE Take 1 tablet (50 mg total) by mouth daily. Start  taking on:  12/11/2017      Follow-up Information    Follow up with your PCP in 1-2 weeks. Schedule an appointment as soon as possible for a visit.        Doran Stabler, MD Follow up.   Specialty:  Gastroenterology Why:  as scheduled Contact information: [place] [address] [phone]        Truitt Merle, MD Follow up.   Specialties:  Hematology,  Oncology Why:  as scheduled Contact information: [address] [place] [address] [phone]          No Known Allergies  Consultations:  IR  Procedures/Studies: Dg Chest 2 View  Result Date: 12/09/2017 CLINICAL DATA:  Cirrhosis, abdominal distension EXAM: CHEST - 2 VIEW COMPARISON:  03/18/2009 FINDINGS: Normal heart size, mediastinal contours, and pulmonary vascularity. Minimal bibasilar atelectasis. Lungs otherwise clear. No pulmonary infiltrate, pleural effusion or pneumothorax. Bones appear demineralized. IMPRESSION: Minimal bibasilar atelectasis. Electronically Signed   By: Lavonia Dana M.D.   On: 12/09/2017 14:16   Ct Abdomen Pelvis W Contrast  Result Date: 12/09/2017 CLINICAL DATA:  62 year old female with abdominal pain and distention. History of cirrhosis. EXAM: CT ABDOMEN AND PELVIS WITH CONTRAST TECHNIQUE: Multidetector CT imaging of the abdomen and pelvis was performed using the standard protocol following bolus administration of intravenous contrast. CONTRAST:  154mL OMNIPAQUE IOHEXOL 300 MG/ML  SOLN COMPARISON:  11/30/2017 ultrasound and 07/13/2017 CT FINDINGS: Lower chest: Mild bibasilar atelectasis noted. Hepatobiliary: Cirrhosis identified with heterogeneous hepatic parenchyma. No focal hepatic abnormalities are identified. Two separate 1 cm gallstones are identified. No biliary dilatation. Pancreas: Unremarkable Spleen: Mild splenomegaly is again noted with a splenic volume of 590 cc. No focal splenic abnormalities. Adrenals/Urinary Tract: The kidneys, adrenal glands and bladder are unremarkable. Stomach/Bowel: No bowel obstruction, bowel dilatation or definite focal bowel wall thickening. The appendix is normal. Vascular/Lymphatic: Aortic atherosclerosis. No enlarged abdominal or pelvic lymph nodes. Reproductive: Uterus and bilateral adnexa are unremarkable. Other: A moderate to large amount of ascites within the abdomen and pelvis noted. Subcutaneous and  mesenteric edema are identified. No evidence of pneumoperitoneum. Musculoskeletal: No acute or suspicious bony abnormalities. Compression of the L5 INFERIOR endplate is unchanged. IMPRESSION: 1. Cirrhosis, mild splenomegaly and moderate to large amount of ascites. 2. Mild bibasilar atelectasis 3. No other acute abnormalities 4.  Aortic Atherosclerosis (ICD10-I70.0). Electronically Signed   By: Margarette Canada M.D.   On: 12/09/2017 14:57   US Paracentesis  Result Date: 11/30/2017 INDICATION: Patient with history of hepatitis-C, cirrhosis, imaging findings consistent with multifocal hepatocellular carcinoma with prior hepatic Y-90 radioembolization, ascites. Request made for therapeutic paracentesis. EXAM: ULTRASOUND GUIDED THERAPEUTIC PARACENTESIS MEDICATIONS: None COMPLICATIONS: None immediate. PROCEDURE: Informed written consent was obtained from the patient after a discussion of the risks, benefits and alternatives to treatment. A timeout was performed prior to the initiation of the procedure. Initial ultrasound scanning demonstrates a moderate amount of ascites within the right mid to lower abdominal quadrant. The right mid to lower abdomen was prepped and draped in the usual sterile fashion. 1% lidocaine was used for local anesthesia. Following this, a 19 gauge, 10-cm, Yueh catheter was introduced. An ultrasound image was saved for documentation purposes. The paracentesis was performed. The catheter was removed and a dressing was applied. The patient tolerated the procedure well without immediate post procedural complication. FINDINGS: A total of approximately 3 liters of yellow fluid was removed. IMPRESSION: Successful ultrasound-guided therapeutic paracentesis yielding 3 liters of peritoneal  fluid. Read by: Rowe Robert, PA-C Electronically Signed   By: Jacqulynn Cadet M.D.   On: 11/30/2017 13:01   Ir Radiologist Eval & Mgmt  Result Date: 11/20/2017 Please refer to notes tab for details about  interventional procedure. (Op Note)  Ir Paracentesis  Result Date: 12/10/2017 INDICATION: Patient with history of hepatitis C, [place] with recurrent ascites. Request for diagnostic and therapeutic paracentesis in IR. EXAM: ULTRASOUND GUIDED DIAGNOSTIC AND THERAPEUTIC PARACENTESIS MEDICATIONS: 10 mL 1% lidocaine. COMPLICATIONS: None immediate. PROCEDURE: Informed written consent was obtained from the patient after a discussion of the risks, benefits and alternatives to treatment. A timeout was performed prior to the initiation of the procedure. Initial ultrasound scanning demonstrates a large amount of ascites within the right lower abdominal quadrant. The right lower abdomen was prepped and draped in the usual sterile fashion. 1% lidocaine was used for local anesthesia. Following this, a 19 gauge, 10-cm, Yueh catheter was introduced. An ultrasound image was saved for documentation purposes. The paracentesis was performed. The catheter was removed and a dressing was applied. The patient tolerated the procedure well without immediate post procedural complication. FINDINGS: A total of approximately 2.2 L of clear light yellow fluid was removed. Samples were sent to the laboratory as requested by the clinical team. IMPRESSION: Successful ultrasound-guided paracentesis yielding 2.2 liters of peritoneal fluid. Read by Candiss Norse, PA-C Electronically Signed   By: Lucrezia Europe M.D.   On: 12/10/2017 12:57   Ir Paracentesis  Result Date: 12/07/2017 INDICATION: Hepatitis-C, cirrhosis, multifocal hepatocellular carcinoma with prior hepatic Y-90 radioembolization, ascites. Request for therapeutic paracentesis. EXAM: ULTRASOUND GUIDED PARACENTESIS MEDICATIONS: 1% lidocaine 10 mL COMPLICATIONS: None immediate. PROCEDURE: Informed written consent was obtained from the patient after a discussion of the risks, benefits and alternatives to treatment. A timeout was performed prior to the initiation of the procedure. Initial  ultrasound scanning demonstrates a moderate amount of ascites within the right lower abdominal quadrant. The right lower abdomen was prepped and draped in the usual sterile fashion. 1% lidocaine with epinephrine was used for local anesthesia. Following this, a 19 gauge, 10-cm, Yueh catheter was introduced. An ultrasound image was saved for documentation purposes. The paracentesis was performed. The catheter was removed and a dressing was applied. The patient tolerated the procedure well without immediate post procedural complication. FINDINGS: A total of approximately 2.7 L of clear yellow fluid was removed. IMPRESSION: Successful ultrasound-guided paracentesis yielding 2.7 liters of peritoneal fluid. Read by: Gareth Eagle, PA-C Electronically Signed   By: Jerilynn Mages.  Shick M.D.   On: 12/07/2017 16:28   Ir Paracentesis  Result Date: 12/04/2017 INDICATION: Cirrhosis and recurrent ascites EXAM: ULTRASOUND-GUIDED PARACENTESIS COMPARISON:  Previous paracentesis. MEDICATIONS: 10 cc 1% lidocaine COMPLICATIONS: None immediate. TECHNIQUE: Informed written consent was obtained from the patient after a discussion of the risks, benefits and alternatives to treatment. A timeout was performed prior to the initiation of the procedure. Initial ultrasound scanning demonstrates a large amount of ascites within the right lower abdominal quadrant. The right lower abdomen was prepped and draped in the usual sterile fashion. 1% lidocaine with epinephrine was used for local anesthesia. Under direct ultrasound guidance, a 19 gauge, 7-cm, Yueh catheter was introduced. An ultrasound image was saved for documentation purposed. The paracentesis was performed. The catheter was removed and a dressing was applied. The patient tolerated the procedure well without immediate post procedural complication. FINDINGS: A total of approximately 3.2 liters of yellow fluid was removed. IMPRESSION: Successful ultrasound-guided paracentesis yielding 3.2 liters  of peritoneal fluid.  Read by Lavonia Drafts [facility] Electronically Signed   By: Aletta Edouard M.D.   On: 12/04/2017 14:22     Subjective: Eager to go home  Discharge Exam: Vitals:   12/10/17 0531 12/10/17 1337  BP: 106/83 136/68  Pulse: 90 96  Resp: 14 16  Temp: 98.4 F (36.9 C) 98.7 F (37.1 C)  SpO2: 96% 100%   Vitals:   12/09/17 1700 12/09/17 2109 12/10/17 0531 12/10/17 1337  BP: 124/78 126/75 106/83 136/68  Pulse: 92 (!) 101 90 96  Resp: 18 18 14 16   Temp: 98.1 F (36.7 C) 98.6 F (37 C) 98.4 F (36.9 C) 98.7 F (37.1 C)  TempSrc: Oral Oral Oral Oral  SpO2: 100% 96% 96% 100%  Weight:      Height:        General: Pt is alert, awake, not in acute distress Cardiovascular: RRR, S1/S2 +, no rubs, no gallops Respiratory: CTA bilaterally, no wheezing, no rhonchi Abdominal: Soft, NT, ND, bowel sounds + Extremities: no edema, no cyanosis   The results of significant diagnostics from this hospitalization (including imaging, microbiology, ancillary and laboratory) are listed below for reference.     Microbiology: Recent Results (from the past 240 hour(s))  Gram stain     Status: None   Collection Time: 12/10/17 11:34 AM  Result Value Ref Range Status   Specimen Description PERITONEAL  Final   Special Requests NONE  Final   Gram Stain   Final    NO WBC SEEN NO ORGANISMS SEEN Performed at [facility], [address], [place], [place] [address]    Report Status 12/10/2017 FINAL  Final     Labs: BNP (last 3 results) No results for input(s): BNP in the last 8760 hours. Basic Metabolic Panel: Recent Labs  Lab 12/09/17 1249 12/10/17 0547  NA 135 135  K 4.1 4.1  CL 108 111  CO2 21* 20*  GLUCOSE 119* 116*  BUN 12 12  CREATININE 0.71 0.78  CALCIUM 8.3* 7.8*   Liver Function Tests: Recent Labs  Lab 12/09/17 1249 12/10/17 0547  AST 83* 70*  ALT 47* 39  ALKPHOS 239* 171*  BILITOT 2.7* 2.7*  PROT 7.0 5.5*  ALBUMIN 2.3* 1.8*   No results for  input(s): LIPASE, AMYLASE in the last 168 hours. No results for input(s): AMMONIA in the last 168 hours. CBC: Recent Labs  Lab 12/09/17 1249 12/10/17 0547  WBC 4.1 3.4*  NEUTROABS 2.9  --   HGB 13.5 12.0  HCT 43.1 36.1  MCV 99.8 95.8  PLT 63* 57*   Cardiac Enzymes: No results for input(s): CKTOTAL, CKMB, CKMBINDEX, TROPONINI in the last 168 hours. BNP: Invalid input(s): POCBNP CBG: No results for input(s): GLUCAP in the last 168 hours. D-Dimer No results for input(s): DDIMER in the last 72 hours. Hgb A1c No results for input(s): HGBA1C in the last 72 hours. Lipid Profile No results for input(s): CHOL, HDL, LDLCALC, TRIG, CHOLHDL, LDLDIRECT in the last 72 hours. Thyroid function studies No results for input(s): TSH, T4TOTAL, T3FREE, THYROIDAB in the last 72 hours.  Invalid input(s): FREET3 Anemia work up No results for input(s): VITAMINB12, FOLATE, FERRITIN, TIBC, IRON, RETICCTPCT in the last 72 hours. Urinalysis    Component Value Date/Time   COLORURINE YELLOW 03/18/2009 2104   APPEARANCEUR CLEAR 03/18/2009 2104   LABSPEC 1.014 03/18/2009 2104   PHURINE 6.5 03/18/2009 2104   GLUCOSEU NEGATIVE 03/18/2009 2104   HGBUR NEGATIVE 03/18/2009 2104   BILIRUBINUR NEGATIVE  03/18/2009 2104   [place] 03/18/2009 2104   PROTEINUR NEGATIVE 03/18/2009 2104   UROBILINOGEN 0.2 03/18/2009 2104   NITRITE NEGATIVE 03/18/2009 2104   LEUKOCYTESUR  03/18/2009 2104    NEGATIVE MICROSCOPIC NOT DONE ON URINES WITH NEGATIVE PROTEIN, BLOOD, LEUKOCYTES, NITRITE, OR GLUCOSE <1000 mg/dL.   Sepsis Labs Invalid input(s): PROCALCITONIN,  WBC,  LACTICIDVEN Microbiology Recent Results (from the past 240 hour(s))  Gram stain     Status: None   Collection Time: 12/10/17 11:34 AM  Result Value Ref Range Status   Specimen Description PERITONEAL  Final   Special Requests NONE  Final   Gram Stain   Final    NO WBC SEEN NO ORGANISMS SEEN Performed at [facility], [address], [place], [place] [address]    Report Status 12/10/2017 FINAL  Final   Time spent: 30 min  SIGNED:   Marylu Lund, MD  [facility] 12/10/2017, 4:03 PM  If 7PM-7AM, please contact night-coverage

## 2017-12-10 NOTE — Procedures (Signed)
PROCEDURE SUMMARY:  Successful image-guided paracentesis from the right lateral abdomen.  Yielded 2.2 liters of clear fluid.  No immediate complications.  Patient tolerated well.   Specimen was sent for labs.  Please see imaging section of Epic for full dictation.  Joaquim Nam PA-C 12/10/2017 11:49 AM

## 2017-12-15 LAB — CULTURE, BODY FLUID-BOTTLE: CULTURE: NO GROWTH

## 2017-12-17 ENCOUNTER — Other Ambulatory Visit (INDEPENDENT_AMBULATORY_CARE_PROVIDER_SITE_OTHER): Payer: Managed Care, Other (non HMO)

## 2017-12-17 ENCOUNTER — Telehealth: Payer: Self-pay | Admitting: Gastroenterology

## 2017-12-17 DIAGNOSIS — C229 Malignant neoplasm of liver, not specified as primary or secondary: Secondary | ICD-10-CM | POA: Diagnosis not present

## 2017-12-17 DIAGNOSIS — K7469 Other cirrhosis of liver: Secondary | ICD-10-CM | POA: Diagnosis not present

## 2017-12-17 LAB — HEPATIC FUNCTION PANEL
ALBUMIN: 2.5 g/dL — AB (ref 3.5–5.2)
ALT: 35 U/L (ref 0–35)
AST: 63 U/L — AB (ref 0–37)
Alkaline Phosphatase: 196 U/L — ABNORMAL HIGH (ref 39–117)
Bilirubin, Direct: 1.1 mg/dL — ABNORMAL HIGH (ref 0.0–0.3)
Total Bilirubin: 2.8 mg/dL — ABNORMAL HIGH (ref 0.2–1.2)
Total Protein: 6.4 g/dL (ref 6.0–8.3)

## 2017-12-17 LAB — PROTIME-INR
INR: 1.6 ratio — AB (ref 0.8–1.0)
PROTHROMBIN TIME: 18 s — AB (ref 9.6–13.1)

## 2017-12-17 NOTE — Telephone Encounter (Signed)
Duplicate message. 

## 2017-12-17 NOTE — Telephone Encounter (Signed)
Patient walked into the office requesting a callback regarding this. [facility] # [phone]

## 2017-12-18 ENCOUNTER — Other Ambulatory Visit: Payer: Self-pay

## 2017-12-18 ENCOUNTER — Encounter: Payer: Self-pay | Admitting: Physician Assistant

## 2017-12-18 ENCOUNTER — Ambulatory Visit (HOSPITAL_COMMUNITY)
Admission: RE | Admit: 2017-12-18 | Discharge: 2017-12-18 | Disposition: A | Discharge status: Home / Self Care | Payer: Managed Care, Other (non HMO) | Source: Ambulatory Visit | Attending: Gastroenterology | Admitting: Gastroenterology

## 2017-12-18 DIAGNOSIS — K7469 Other cirrhosis of liver: Secondary | ICD-10-CM

## 2017-12-18 DIAGNOSIS — R188 Other ascites: Secondary | ICD-10-CM

## 2017-12-18 HISTORY — PX: IR PARACENTESIS: IMG2679

## 2017-12-18 MED ORDER — LIDOCAINE HCL (PF) 1 % IJ SOLN
INTRAMUSCULAR | Status: DC | PRN
  Administered 2017-12-18: 10 mL

## 2017-12-18 MED ORDER — LIDOCAINE HCL 1 % IJ SOLN
INTRAMUSCULAR | Status: AC
  Filled 2017-12-18: qty 20

## 2017-12-18 NOTE — Procedures (Signed)
PROCEDURE SUMMARY:  Successful image-guided paracentesis from the right lateral abdomen.  Yielded 3.1 liters of clear yellow fluid.  No immediate complications.  Patient tolerated well.   Specimen was not sent for labs.  Please see imaging section of Epic for full dictation.  Joaquim Nam PA-C 12/18/2017 2:54 PM

## 2017-12-20 ENCOUNTER — Other Ambulatory Visit: Payer: Self-pay

## 2017-12-20 DIAGNOSIS — R188 Other ascites: Secondary | ICD-10-CM

## 2017-12-20 MED ORDER — FUROSEMIDE 20 MG PO TABS: 20.0000 mg | ORAL_TABLET | Freq: Every day | ORAL | 0 refills | Status: DC

## 2017-12-20 MED ORDER — SPIRONOLACTONE 50 MG PO TABS: 50.0000 mg | ORAL_TABLET | Freq: Every day | ORAL | 0 refills | Status: DC

## 2017-12-24 ENCOUNTER — Other Ambulatory Visit: Payer: Self-pay

## 2017-12-24 ENCOUNTER — Telehealth: Payer: Self-pay | Admitting: Gastroenterology

## 2017-12-24 DIAGNOSIS — R180 Malignant ascites: Secondary | ICD-10-CM

## 2017-12-24 MED ORDER — FUROSEMIDE 40 MG PO TABS: 40.0000 mg | ORAL_TABLET | Freq: Every day | ORAL | 2 refills | Status: DC

## 2017-12-24 MED ORDER — SPIRONOLACTONE 100 MG PO TABS: 100.0000 mg | ORAL_TABLET | Freq: Every day | ORAL | 2 refills | Status: DC

## 2017-12-24 NOTE — Telephone Encounter (Signed)
Pt reports she has fluid again and is requesting a paracentesis today, reports she is having difficulty with SOB due to the fluid. Please advise.

## 2017-12-24 NOTE — Telephone Encounter (Signed)
Patient states that she needs to be scheduled for paracentesis today states that she can hardly breathe

## 2017-12-24 NOTE — Telephone Encounter (Signed)
Spoke with pt and she is aware. Scheduled for [place] para at [facility] 12/25/17@10 :30am, pt to arrive there at 10:15am.  Pt knows to increase meds after para, new scripts sent to pharmacy.

## 2017-12-24 NOTE — Telephone Encounter (Signed)
Please see if paracentesis can be arranged for today or tomorrow. Up to 5 liters can be removed, but so far, radiology has typically only been able to remove 3 liters at a time. No IV albumin needed.  Renee Rice should understand that fluid may not all be accessible to fluid removal. Therefore, day after fluid removed, increase furosemide to 40 mg once daily and spironolactone to 100 mg once daily.  Will need new prescriptions for increased doses.

## 2017-12-25 ENCOUNTER — Ambulatory Visit (HOSPITAL_COMMUNITY)
Admission: RE | Admit: 2017-12-25 | Discharge: 2017-12-25 | Disposition: A | Discharge status: Home / Self Care | Payer: Managed Care, Other (non HMO) | Source: Ambulatory Visit | Attending: Gastroenterology | Admitting: Gastroenterology

## 2017-12-25 DIAGNOSIS — R180 Malignant ascites: Secondary | ICD-10-CM | POA: Insufficient documentation

## 2017-12-25 MED ORDER — LIDOCAINE HCL 1 % IJ SOLN
INTRAMUSCULAR | Status: AC
  Filled 2017-12-25: qty 20

## 2017-12-25 NOTE — Procedures (Addendum)
Ultrasound-guided  therapeutic paracentesis performed yielding 4.1 liters of hazy, yellow  fluid. No immediate complications. EBL< 2cc.

## 2017-12-27 ENCOUNTER — Other Ambulatory Visit (INDEPENDENT_AMBULATORY_CARE_PROVIDER_SITE_OTHER): Payer: Managed Care, Other (non HMO)

## 2017-12-27 ENCOUNTER — Telehealth: Payer: Self-pay | Admitting: Gastroenterology

## 2017-12-27 DIAGNOSIS — R188 Other ascites: Secondary | ICD-10-CM

## 2017-12-27 LAB — COMPREHENSIVE METABOLIC PANEL
ALT: 33 U/L (ref 0–35)
AST: 65 U/L — AB (ref 0–37)
Albumin: 2.5 g/dL — ABNORMAL LOW (ref 3.5–5.2)
Alkaline Phosphatase: 206 U/L — ABNORMAL HIGH (ref 39–117)
BUN: 16 mg/dL (ref 6–23)
CO2: 25 mEq/L (ref 19–32)
CREATININE: 0.78 mg/dL (ref 0.40–1.20)
Calcium: 8.2 mg/dL — ABNORMAL LOW (ref 8.4–10.5)
Chloride: 103 mEq/L (ref 96–112)
GFR: 79.81 mL/min (ref >60.00)
Glucose, Bld: 103 mg/dL — ABNORMAL HIGH (ref 70–99)
Potassium: 4.4 mEq/L (ref 3.5–5.1)
Sodium: 133 mEq/L — ABNORMAL LOW (ref 135–145)
Total Bilirubin: 2.8 mg/dL — ABNORMAL HIGH (ref 0.2–1.2)
Total Protein: 6.5 g/dL (ref 6.0–8.3)

## 2017-12-27 NOTE — Telephone Encounter (Signed)
Patient states that states that she had paracentesis yesterday and site is leaking

## 2017-12-27 NOTE — Telephone Encounter (Signed)
Spoke with patient and she is leaking some fluid from where she had her para yesterday. Placed call to [facility] IR radiology and let them know, they are going to contact the patient regarding this issue. Patient aware.

## 2017-12-31 ENCOUNTER — Ambulatory Visit (HOSPITAL_COMMUNITY)
Admission: RE | Admit: 2017-12-31 | Discharge: 2017-12-31 | Disposition: A | Discharge status: Home / Self Care | Payer: Managed Care, Other (non HMO) | Source: Ambulatory Visit | Attending: Gastroenterology | Admitting: Gastroenterology

## 2017-12-31 ENCOUNTER — Encounter (HOSPITAL_COMMUNITY): Payer: Self-pay | Admitting: Student

## 2017-12-31 DIAGNOSIS — C220 Liver cell carcinoma: Secondary | ICD-10-CM | POA: Insufficient documentation

## 2017-12-31 DIAGNOSIS — R188 Other ascites: Secondary | ICD-10-CM | POA: Insufficient documentation

## 2017-12-31 DIAGNOSIS — B1920 Unspecified viral hepatitis C without hepatic coma: Secondary | ICD-10-CM | POA: Insufficient documentation

## 2017-12-31 DIAGNOSIS — K7460 Unspecified cirrhosis of liver: Secondary | ICD-10-CM | POA: Insufficient documentation

## 2017-12-31 HISTORY — PX: IR PARACENTESIS: IMG2679

## 2017-12-31 MED ORDER — LIDOCAINE HCL 1 % IJ SOLN
INTRAMUSCULAR | Status: AC
  Filled 2017-12-31: qty 20

## 2017-12-31 MED ORDER — LIDOCAINE HCL 1 % IJ SOLN
INTRAMUSCULAR | Status: DC | PRN
  Administered 2017-12-31: 15 mL

## 2017-12-31 NOTE — Procedures (Signed)
PROCEDURE SUMMARY:  Successful image-guided paracentesis from the right lateral abdomen.  Yielded 4.8 liters of clear yellow fluid.  No immediate complications.  Patient tolerated well.   Specimen was not sent for labs.  Claris Pong Louk PA-C 12/31/2017 10:19 AM

## 2018-01-01 ENCOUNTER — Other Ambulatory Visit (INDEPENDENT_AMBULATORY_CARE_PROVIDER_SITE_OTHER): Payer: Managed Care, Other (non HMO)

## 2018-01-01 ENCOUNTER — Encounter: Payer: Self-pay | Admitting: Gastroenterology

## 2018-01-01 ENCOUNTER — Ambulatory Visit (INDEPENDENT_AMBULATORY_CARE_PROVIDER_SITE_OTHER): Payer: Managed Care, Other (non HMO) | Admitting: Gastroenterology

## 2018-01-01 VITALS — BP 110/70 | HR 100 | Ht 71.0 in | Wt 207.0 lb

## 2018-01-01 DIAGNOSIS — R053 Chronic cough: Secondary | ICD-10-CM

## 2018-01-01 DIAGNOSIS — B182 Chronic viral hepatitis C: Secondary | ICD-10-CM

## 2018-01-01 DIAGNOSIS — R180 Malignant ascites: Secondary | ICD-10-CM

## 2018-01-01 DIAGNOSIS — R05 Cough: Secondary | ICD-10-CM

## 2018-01-01 DIAGNOSIS — R7989 Other specified abnormal findings of blood chemistry: Secondary | ICD-10-CM

## 2018-01-01 DIAGNOSIS — R188 Other ascites: Secondary | ICD-10-CM | POA: Diagnosis not present

## 2018-01-01 DIAGNOSIS — K7469 Other cirrhosis of liver: Secondary | ICD-10-CM

## 2018-01-01 DIAGNOSIS — K5909 Other constipation: Secondary | ICD-10-CM

## 2018-01-01 DIAGNOSIS — C220 Liver cell carcinoma: Secondary | ICD-10-CM

## 2018-01-01 DIAGNOSIS — R945 Abnormal results of liver function studies: Secondary | ICD-10-CM

## 2018-01-01 LAB — PROTIME-INR
INR: 1.6 ratio — ABNORMAL HIGH (ref 0.8–1.0)
Prothrombin Time: 18.3 s — ABNORMAL HIGH (ref 9.6–13.1)

## 2018-01-01 MED ORDER — GUAIFENESIN-CODEINE 100-10 MG/5ML PO SOLN: 10.0000 mL | Freq: Four times a day (QID) | ORAL | 0 refills | Status: DC | PRN

## 2018-01-01 MED ORDER — LACTULOSE 10 G PO PACK: 10.0000 g | PACK | Freq: Two times a day (BID) | ORAL | 3 refills | Status: DC

## 2018-01-01 NOTE — Addendum Note (Signed)
Addended by: Nelida Meuse on: 01/01/2018 09:03 PM   Modules accepted: Orders

## 2018-01-01 NOTE — Patient Instructions (Signed)
If you are age 61 or older, your body mass index should be between 23-30. Your Body mass index is 28.87 kg/m. If this is out of the aforementioned range listed, please consider follow up with your Primary Care Provider.  If you are age 45 or younger, your body mass index should be between 19-25. Your Body mass index is 28.87 kg/m. If this is out of the aformentioned range listed, please consider follow up with your Primary Care Provider.   You have been scheduled for an abdominal paracentesis at [facility] radiology (1st floor of hospital) on 01-04-2018  at 10am. Please arrive at least 15 minutes prior to your appointment time for registration. Should you need to reschedule this appointment for any reason, please call our office at [phone].  Your provider has requested that you go to the basement level for lab work before leaving today. Press "B" on the elevator. The lab is located at the first door on the left as you exit the elevator.   It was a pleasure to see you today!  Dr. Loletha Carrow

## 2018-01-01 NOTE — Progress Notes (Signed)
[facility] Progress Note  Chief Complaint: Cirrhosis and hepatocellular carcinoma  Subjective  History:  Renee Rice follows up with me first time since her initial office consult on 07/04/2017, at which time she had been referred from infectious disease for history of hepatitis C and new diagnosis of cirrhosis.  She had been having worsening peripheral edema, and imaging revealed liver mass with elevated alpha-fetoprotein.  She has undergone 2 sessions of Y90 sirspheres radio embolization in the right hepatic lobe on October 4 and October 18.  She had decompensation of liver disease afterwards with elevation of LFTs and worsening ascites.  She was admitted overnight from this on 11/24 because the ascites makes her dyspneic. Last week spironolactone was increased from 50 to 100 mg daily and furosemide increased from 20 to 40 mg. She was accompanied by her husband and mother today, and they all have considerable questions and concerns about her diagnosis, options and prognosis.  She has not been seen since oncology since her initial consult.  The last she understood from Dr. Pascal Lux of interventional radiology, he was waiting a few months for any reimaging and consideration of need for additional treatment since she had worsening of her liver function.   ROS: Cardiovascular:  no chest pain Respiratory: no dyspnea Fatigue Muscle cramps Weight gain (fluid) Depression and anxiety Chronic cough Constipation with "pellet" stools.  Often feels lower abdominal fullness with need for BM and/or urination, but cannot go much. Does urinate a few hours after diuretics  Remainder of systems negative except as above  The patient's Past Medical, Family and Social History were reviewed and are on file in the EMR. Her HCV has not been treated  Objective:  Med list reviewed  Current Outpatient Medications:  .  acetaminophen (TYLENOL) 500 MG tablet, Take 1,000 mg by mouth every 6 (six) hours as  needed for mild pain. , Disp: , Rfl:  .  calcium carbonate (TUMS - DOSED IN MG ELEMENTAL CALCIUM) 500 MG chewable tablet, Chew 1 tablet by mouth daily., Disp: , Rfl:  .  furosemide (LASIX) 40 MG tablet, Take 1 tablet (40 mg total) by mouth daily., Disp: 30 tablet, Rfl: 2 .  spironolactone (ALDACTONE) 100 MG tablet, Take 1 tablet (100 mg total) by mouth daily., Disp: 30 tablet, Rfl: 2 .  guaiFENesin-codeine 100-10 MG/5ML syrup, Take 10 mLs by mouth every 6 (six) hours as needed for cough., Disp: 180 mL, Rfl: 0   Vital signs in last 24 hrs: Vitals:   01/01/18 1511  BP: 110/70  Pulse: 100    Physical Exam  She is chronically ill-appearing, anxious and tearful at times.  HEENT: sclera mildly icteric, oral mucosa moist without lesions  Neck: supple, no thyromegaly, JVD or lymphadenopathy  Cardiac: RRR without murmurs, S1S2 heard, + peripheral edema  Pulm: clear to auscultation bilaterally, normal RR and effort noted  Abdomen: soft, no tenderness, with active bowel sounds. No guarding or palpable hepatosplenomegaly.  Large volume ascites, not tense.  No leakage from RLQ paracentesis site of yesterday.  Skin; warm and dry, no jaundice or rash Neuro: Somewhat unsteady when she stands up, then gets on exam table without assistance.  Speech fluent, normal mentation, no asterixis  Recent Labs:  CBC Latest Ref Rng & Units 12/10/2017 12/09/2017 11/02/2017  WBC 4.0 - 10.5 K/uL 3.4(L) 4.1 3.5(L)  Hemoglobin 12.0 - 15.0 g/dL 12.0 13.5 12.4  Hematocrit 36.0 - 46.0 % 36.1 43.1 38.1  Platelets 150 - 400 K/uL 57(L) 63(L)  63(L)   CMP Latest Ref Rng & Units 12/27/2017 12/17/2017 12/10/2017  Glucose 70 - 99 mg/dL 103(H) - 116(H)  BUN 6 - 23 mg/dL 16 - 12  Creatinine 0.40 - 1.20 mg/dL 0.78 - 0.78  Sodium 135 - 145 mEq/L 133(L) - 135  Potassium 3.5 - 5.1 mEq/L 4.4 - 4.1  Chloride 96 - 112 mEq/L 103 - 111  CO2 19 - 32 mEq/L 25 - 20(L)  Calcium 8.4 - 10.5 mg/dL 8.2(L) - 7.8(L)  Total Protein 6.0  - 8.3 g/dL 6.5 6.4 5.5(L)  Total Bilirubin 0.2 - 1.2 mg/dL 2.8(H) 2.8(H) 2.7(H)  Alkaline Phos 39 - 117 U/L 206(H) 196(H) 171(H)  AST 0 - 37 U/L 65(H) 63(H) 70(H)  ALT 0 - 35 U/L 33 35 39   INR 1.13 on October 4, 1.48 on November 24, and 1.6 on 12 2. INR 1.6 today (resulted later in the day) AFP 28 on 6/19 and 22 on 9/19  Ascites cell count on 12/10/2017 showed 116 WBC  Radiologic studies:  MRI 08/10/17: Lower chest: No acute findings.   Hepatobiliary: Hepatic cirrhosis again demonstrated. A 1.8 cm mass is seen in segment 7 on image 43/1001, which shows homogeneous hypervascular enhancement, delayed washout, and peripheral rim enhancement. A 1.0 cm mass is seen in segment 2 on image 41/1002 which shows the same characteristics. These findings are diagnostic of hepatocellular carcinoma.   In addition, there is a 9 mm hypervascular lesion in segment 2 on image 33/1001, a 9 mm hypervascular lesion segment 8 on image 41/1001, and an 8 mm hypervascular lesion in segment 5 on image 81/1001. These lesions show no definite delayed washout or peripheral rim enhancement but are too small to characterize.   Several gallstones are seen, however there is no evidence of cholecystitis or biliary ductal dilatation.   Pancreas:  No mass or inflammatory changes.   Spleen: Mild splenomegaly, consistent with portal venous hypertension.   Adrenals/Urinary Tract: No masses identified. No evidence of hydronephrosis.   Stomach/Bowel: Visualized portion unremarkable.   Vascular/Lymphatic: No pathologically enlarged lymph nodes identified. No abdominal aortic aneurysm. Portosystemic collaterals in the gastrohepatic and gastrosplenic ligaments are consistent with portal venous hypertension. Portal veins are patent.   Other:  No evidence of ascites.   Musculoskeletal:  No suspicious bone lesions identified.   IMPRESSION: Hepatic cirrhosis and findings of portal venous hypertension.   1.8 cm  hypervascular mass in segment 7 and 1.0 cm hypervascular mass in segment 2, both of which are diagnostic of hepatocellular carcinoma.   At least 3 other less than 1 cm hypervascular lesions are too small to characterize, but suspicious for additional hepatocellular carcinomas.   No evidence of abdominal metastatic disease.    Reports from the interventional radiology procedures were reviewed and are on file.  She has also undergone therapeutic paracentesis on November 19, November 22, November 25, December 3, December 10 and December 16.  Most sessions removed 2 to 3 L, the last 2 sessions have removed 4 and 5 L.  @ASSESSMENTPLANBEGIN @ Assessment: Encounter Diagnoses  Name Primary?  . Other cirrhosis of liver ([place]) Yes  . Chronic hepatitis C without hepatic coma ([place])   . Other ascites   . Elevated LFTs   . Chronic cough   . Chronic constipation   . Hepatocellular carcinoma ([place])    Renee Rice has suffered decompensated cirrhosis since the interventional procedures to treat her hepatoma.  She seemed to be under the impression after her visit with oncology that a  liver transplant could be considered, and she feels like she may have missed that opportunity and should have done that instead of the IR option.  However, with multifocal disease as she had, she did not meet [place] criteria for consideration of liver transplant in the setting of hepatoma.  I suspect she is not currently a transplant consideration candidate for the same reason, even though she is undergone that therapy, but I told her I would be glad to discuss this with a transplant hepatologist in the near future.  She is frankly despondent about her condition, feels she has not had sufficient information about her stage, prognosis and treatment options.  At this point, her most challenging management issue appears to be her rapidly reaccumulating large volume ascites that has not required paracentesis weekly.  I had asked the  radiologist to remove no more than 5 L at a time, because I am concerned she will be volume sensitive with low blood pressure and low serum sodium.  That is why I was only making slow incremental increases in her diuretics.  She does not have hepatic encephalopathy, but low-dose lactulose would probably help her constipation.  I do not know why she has a chronic cough, but she does not have regular primary care.  I agreed to give her some cough suppressant for short period of time until he can receive regular attention and management by primary care.  I am concerned about her depression and anxiety related to her poor condition and worries about her prognosis.  I tried to reassure them that, although her LFTs worsened after the interventional procedures, they seems to have stabilized since then.  I will send her for an INR today to confirm that.  She needs to be reevaluated by oncology, and currently has an appointment there in a month.  I will copy this to Dr.Feng and see if it is possible to get her an appointment sooner.Renee Rice and her family would like a better idea how this malignancy should be followed radiographically, at what interval, and what other treatment options might be.  I am afraid I do not know the answer to those questions, so we will try to expedite oncology follow-up.  I think Renee Rice and her family would also benefit from the other services that oncology can help connect her to such as palliative care and behavioral health to cope with her diagnosis.  I will copy this to Dr. Sandi Mariscal of interventional radiology and ask whether a Pleurx catheter could be considered for ease of paracentesis.Renee Rice says it is painful to have this procedure done every week and it causes her considerable anxiety.  While this is not really malignant ascites such as from a diffuse peritoneal carcinomatosis, her advanced stage of liver disease and prognosis warrant considering this option.  I am reluctant  to increase her diuretics right now out of concern for precipitating renal dysfunction.  She just had a paracentesis yesterday, she does not think she can make it a whole other week, and I think there is some element of anxiety when she feels even slightly short of breath from this ascites.  So we have set her up for a paracentesis later this week and I would like her to have up to 7 L removed with administration of 50 g of 25% albumin IV.   Plan: Paracentesis plans as noted above PA and lateral chest x-ray that same day because of chronic cough I prescribed her Robitussin with codeine We will hopefully  expedite follow-up with oncology as noted above Lactulose 10 g twice a day as needed to relieve constipation I would like oncology to facilitate palliative care evaluation.  She has not yet had an upper endoscopy to screen for esophageal varices.  I am not doing that at present because I think her overall condition increases the risk of this procedure and I do not think she can currently tolerate beta-blocker therapy given the decompensated disease and my concerns of precipitating renal dysfunction with volume removal.  Follow-up and plans based on upcoming evaluations by oncology and IR.  Total time 55 minutes, over half spent face-to-face with patient in counseling and coordination of care.   Nelida Meuse III

## 2018-01-02 ENCOUNTER — Telehealth: Payer: Self-pay

## 2018-01-02 ENCOUNTER — Other Ambulatory Visit: Payer: Self-pay

## 2018-01-02 DIAGNOSIS — R053 Chronic cough: Secondary | ICD-10-CM

## 2018-01-02 DIAGNOSIS — R05 Cough: Secondary | ICD-10-CM

## 2018-01-02 DIAGNOSIS — K7469 Other cirrhosis of liver: Secondary | ICD-10-CM

## 2018-01-02 NOTE — Telephone Encounter (Signed)
-----   Message from Elliott, MD sent at 01/01/2018  9:02 PM EST ----- Please tell her the INR is stable at 1.6.  So the liver function is holding steady lately.  Also,  I would like her to have a PA and Lateral chest Xray when she goes for paracentesis later this week because of the chronic cough.  Please place the order.  Lastly, I prescribed some lactulose to relieve the constipation.

## 2018-01-02 NOTE — Telephone Encounter (Signed)
Pt contacted. She agrees to have the chest xray completed on Friday. And she is aware of the Rx Lactulose for the constipation.

## 2018-01-03 ENCOUNTER — Telehealth: Payer: Self-pay | Admitting: Interventional Radiology

## 2018-01-03 NOTE — Telephone Encounter (Signed)
Lengthy discussion held today with Dr. Loletha Carrow regarding management options for this patient's recurrent highly symptomatic ascites which she has experienced since undergoing Y 90 radioembolization for multifocal hepatocellular carcinoma.  At the present time, I do not feel the patient is a good candidate for tunneled pleural drainage catheter as her recurrent ascites is likely due to her worsening intrinsic liver dysfunction (precipitated by the Y 90 embolization) as opposed to progressive malignancy and she is not currently seeking palliative measures.  At the present time, Dr. Loletha Carrow does not feel the patient is a good candidate for TIPS creation (which I agree) and is hopeful her ascites can ultimately be managed with more aggressive diuretic administration.  In the meantime, the patient will continue with as needed paracenteses, potentially on a weekly basis as patient tolerates any intra-abdominal fluid poorly.  Agree with referral to oncology for discussions regarding ultimate prognosis and/or additional potential treatment options.  Note, CT scan of the abdomen pelvis performed 12/09/2017 during recent ED admission was negative for evidence of complication following the radioembolization, specifically, the portal vein is patent.  As such, postprocedural MRI will be scheduled as originally intended 3 months following the radioembolization (mid January 2020).  Again, I sincerely appreciate Dr. Loletha Carrow' expertise in managing this unfortunate patient.  Ronny Bacon, MD Pager #: [phone]

## 2018-01-04 ENCOUNTER — Telehealth: Payer: Self-pay | Admitting: Hematology

## 2018-01-04 ENCOUNTER — Ambulatory Visit (HOSPITAL_COMMUNITY)
Admission: RE | Admit: 2018-01-04 | Discharge: 2018-01-04 | Disposition: A | Discharge status: Home / Self Care | Payer: Managed Care, Other (non HMO) | Source: Ambulatory Visit | Attending: Gastroenterology | Admitting: Gastroenterology

## 2018-01-04 ENCOUNTER — Encounter (HOSPITAL_COMMUNITY): Payer: Self-pay | Admitting: Student

## 2018-01-04 ENCOUNTER — Telehealth: Payer: Self-pay | Admitting: Family Medicine

## 2018-01-04 DIAGNOSIS — K7469 Other cirrhosis of liver: Secondary | ICD-10-CM | POA: Insufficient documentation

## 2018-01-04 DIAGNOSIS — R05 Cough: Secondary | ICD-10-CM

## 2018-01-04 DIAGNOSIS — R053 Chronic cough: Secondary | ICD-10-CM

## 2018-01-04 DIAGNOSIS — R188 Other ascites: Secondary | ICD-10-CM | POA: Insufficient documentation

## 2018-01-04 DIAGNOSIS — R180 Malignant ascites: Secondary | ICD-10-CM

## 2018-01-04 HISTORY — PX: IR PARACENTESIS: IMG2679

## 2018-01-04 MED ORDER — ALBUMIN HUMAN 25 % IV SOLN: 50.0000 g | Freq: Once | INTRAVENOUS | Status: DC

## 2018-01-04 MED ORDER — ALBUMIN HUMAN 25 % IV SOLN
INTRAVENOUS | Status: AC
  Filled 2018-01-04: qty 200

## 2018-01-04 MED ORDER — LIDOCAINE HCL 1 % IJ SOLN
INTRAMUSCULAR | Status: AC
  Administered 2018-01-04: 20 mL
  Filled 2018-01-04: qty 20

## 2018-01-04 NOTE — Telephone Encounter (Signed)
Patient is returning call to [facility] and would like a call back.  Please advise.  CB# [phone]

## 2018-01-04 NOTE — Telephone Encounter (Signed)
R/s appt per 12/20 sch message - pt is aware of appt

## 2018-01-04 NOTE — Procedures (Signed)
PROCEDURE SUMMARY:  Successful image-guided paracentesis from the right lower abdomen.  Yielded 3.4 liters of hazy gold fluid.  No immediate complications.  Patient tolerated well.   Specimen was not sent for labs.  Audris Speaker PA-C 01/04/2018 12:12 PM

## 2018-01-04 NOTE — Telephone Encounter (Signed)
Received a transfer call from Dr. Wilfrid Lund with [facility]. He says he wanted to speak to a doctor, PA, or someone clinical that can help him with this patient. He says the patient hasn't been seen at [facility] since earlier this year and he's been following her for Liver Cirrhosis and she developed liver cancer. He says he saw her in the office earlier this week for a primary issue of dyspnea and chronic, non-productive cough. He says a chest x-ray was ordered and he received the report of pneumonia and pleural effusion. He says he called to [place] because the patient will need to be seen in the office tomorrow to be put on antibiotics. He says this is beyond what he can do for her. He says the patient is not in his office and she will need to be called. I advised I will call over to the office to let the nurse know what is going on. I called and spoke to [place], [employer] and advised of the above. I advised I did not triage her and that I was calling relaying the message Dr. Loletha Carrow wanted me to let someone in the office know. She says she will have the front office staff to contact the patient.

## 2018-01-05 ENCOUNTER — Encounter (HOSPITAL_COMMUNITY): Payer: Self-pay

## 2018-01-05 ENCOUNTER — Other Ambulatory Visit: Payer: Self-pay

## 2018-01-05 ENCOUNTER — Telehealth: Payer: Self-pay | Admitting: Physician Assistant

## 2018-01-05 ENCOUNTER — Observation Stay (HOSPITAL_COMMUNITY)
Admission: EM | Admit: 2018-01-05 | Discharge: 2018-01-07 | Disposition: A | Discharge status: Home / Self Care | Payer: Managed Care, Other (non HMO) | Attending: Family Medicine | Admitting: Family Medicine

## 2018-01-05 DIAGNOSIS — D696 Thrombocytopenia, unspecified: Secondary | ICD-10-CM | POA: Diagnosis not present

## 2018-01-05 DIAGNOSIS — E871 Hypo-osmolality and hyponatremia: Secondary | ICD-10-CM

## 2018-01-05 DIAGNOSIS — K7469 Other cirrhosis of liver: Secondary | ICD-10-CM

## 2018-01-05 DIAGNOSIS — J90 Pleural effusion, not elsewhere classified: Secondary | ICD-10-CM | POA: Insufficient documentation

## 2018-01-05 DIAGNOSIS — J181 Lobar pneumonia, unspecified organism: Secondary | ICD-10-CM | POA: Diagnosis not present

## 2018-01-05 DIAGNOSIS — C220 Liver cell carcinoma: Secondary | ICD-10-CM | POA: Insufficient documentation

## 2018-01-05 DIAGNOSIS — K7460 Unspecified cirrhosis of liver: Secondary | ICD-10-CM | POA: Insufficient documentation

## 2018-01-05 DIAGNOSIS — R69 Illness, unspecified: Secondary | ICD-10-CM | POA: Diagnosis not present

## 2018-01-05 DIAGNOSIS — R8271 Bacteriuria: Secondary | ICD-10-CM | POA: Insufficient documentation

## 2018-01-05 DIAGNOSIS — F17200 Nicotine dependence, unspecified, uncomplicated: Secondary | ICD-10-CM

## 2018-01-05 DIAGNOSIS — B182 Chronic viral hepatitis C: Secondary | ICD-10-CM | POA: Insufficient documentation

## 2018-01-05 DIAGNOSIS — J189 Pneumonia, unspecified organism: Secondary | ICD-10-CM | POA: Diagnosis present

## 2018-01-05 DIAGNOSIS — R188 Other ascites: Secondary | ICD-10-CM | POA: Insufficient documentation

## 2018-01-05 DIAGNOSIS — F17210 Nicotine dependence, cigarettes, uncomplicated: Secondary | ICD-10-CM | POA: Insufficient documentation

## 2018-01-05 DIAGNOSIS — R7989 Other specified abnormal findings of blood chemistry: Secondary | ICD-10-CM | POA: Insufficient documentation

## 2018-01-05 DIAGNOSIS — Z79899 Other long term (current) drug therapy: Secondary | ICD-10-CM | POA: Insufficient documentation

## 2018-01-05 DIAGNOSIS — E8809 Other disorders of plasma-protein metabolism, not elsewhere classified: Secondary | ICD-10-CM | POA: Insufficient documentation

## 2018-01-05 LAB — CBC WITH DIFFERENTIAL/PLATELET
Abs Immature Granulocytes: 0.07 10*3/uL (ref 0.00–0.07)
Basophils Absolute: 0.1 10*3/uL (ref 0.0–0.1)
Basophils Relative: 1 %
EOS ABS: 0.2 10*3/uL (ref 0.0–0.5)
EOS PCT: 3 %
HCT: 38.4 % (ref 36.0–46.0)
Hemoglobin: 12.8 g/dL (ref 12.0–15.0)
Immature Granulocytes: 1 %
Lymphocytes Relative: 7 %
Lymphs Abs: 0.5 10*3/uL — ABNORMAL LOW (ref 0.7–4.0)
MCH: 31.6 pg (ref 26.0–34.0)
MCHC: 33.3 g/dL (ref 30.0–36.0)
MCV: 94.8 fL (ref 80.0–100.0)
Monocytes Absolute: 1.1 10*3/uL — ABNORMAL HIGH (ref 0.1–1.0)
Monocytes Relative: 13 %
Neutro Abs: 6 10*3/uL (ref 1.7–7.7)
Neutrophils Relative %: 75 %
Platelets: 76 10*3/uL — ABNORMAL LOW (ref 150–400)
RBC: 4.05 MIL/uL (ref 3.87–5.11)
RDW: 15.5 % (ref 11.5–15.5)
WBC: 8 10*3/uL (ref 4.0–10.5)
nRBC: 0 % (ref 0.0–0.2)

## 2018-01-05 LAB — COMPREHENSIVE METABOLIC PANEL
ALT: 41 U/L (ref 0–44)
AST: 83 U/L — ABNORMAL HIGH (ref 15–41)
Albumin: 2.7 g/dL — ABNORMAL LOW (ref 3.5–5.0)
Alkaline Phosphatase: 160 U/L — ABNORMAL HIGH (ref 38–126)
Anion gap: 10 (ref 5–15)
BILIRUBIN TOTAL: 3.8 mg/dL — AB (ref 0.3–1.2)
BUN: 17 mg/dL (ref 8–23)
CALCIUM: 8.6 mg/dL — AB (ref 8.9–10.3)
CO2: 24 mmol/L (ref 22–32)
CREATININE: 1.07 mg/dL — AB (ref 0.44–1.00)
Chloride: 97 mmol/L — ABNORMAL LOW (ref 98–111)
GFR calc Af Amer: >60 mL/min (ref >60)
GFR calc non Af Amer: 56 mL/min — ABNORMAL LOW (ref >60)
Glucose, Bld: 125 mg/dL — ABNORMAL HIGH (ref 70–99)
Potassium: 4.4 mmol/L (ref 3.5–5.1)
Sodium: 131 mmol/L — ABNORMAL LOW (ref 135–145)
TOTAL PROTEIN: 6.4 g/dL — AB (ref 6.5–8.1)

## 2018-01-05 LAB — LIPASE, BLOOD: Lipase: 25 U/L (ref 11–51)

## 2018-01-05 LAB — URINALYSIS, MICROSCOPIC (REFLEX)

## 2018-01-05 LAB — URINALYSIS, ROUTINE W REFLEX MICROSCOPIC
Glucose, UA: NEGATIVE mg/dL
Ketones, ur: NEGATIVE mg/dL
LEUKOCYTES UA: NEGATIVE
Nitrite: POSITIVE — AB
Protein, ur: NEGATIVE mg/dL
Specific Gravity, Urine: >1.03 — ABNORMAL HIGH (ref 1.005–1.030)
pH: 5.5 (ref 5.0–8.0)

## 2018-01-05 LAB — PROTIME-INR
INR: 1.69
Prothrombin Time: 19.7 seconds — ABNORMAL HIGH (ref 11.4–15.2)

## 2018-01-05 LAB — I-STAT TROPONIN, ED: Troponin i, poc: 0.03 ng/mL (ref 0.00–0.08)

## 2018-01-05 MED ORDER — SODIUM CHLORIDE 0.9 % IV SOLN
500.0000 mg | Freq: Once | INTRAVENOUS | Status: AC
  Administered 2018-01-05: 500 mg via INTRAVENOUS
  Filled 2018-01-05: qty 500

## 2018-01-05 MED ORDER — INFLUENZA VAC SPLIT QUAD 0.5 ML IM SUSY
0.5000 mL | PREFILLED_SYRINGE | INTRAMUSCULAR | Status: DC
  Filled 2018-01-05: qty 0.5

## 2018-01-05 MED ORDER — ONDANSETRON HCL 4 MG PO TABS: 4.0000 mg | ORAL_TABLET | Freq: Four times a day (QID) | ORAL | Status: DC | PRN

## 2018-01-05 MED ORDER — SODIUM CHLORIDE 0.9 % IV SOLN
1.0000 g | Freq: Once | INTRAVENOUS | Status: AC
  Administered 2018-01-05: 1 g via INTRAVENOUS
  Filled 2018-01-05: qty 10

## 2018-01-05 MED ORDER — HYDROXYZINE HCL 25 MG PO TABS
25.0000 mg | ORAL_TABLET | Freq: Three times a day (TID) | ORAL | Status: DC | PRN
  Administered 2018-01-05 – 2018-01-06 (×2): 25 mg via ORAL
  Filled 2018-01-05 (×2): qty 1

## 2018-01-05 MED ORDER — ENOXAPARIN SODIUM 40 MG/0.4ML ~~LOC~~ SOLN: 40.0000 mg | SUBCUTANEOUS | Status: DC

## 2018-01-05 MED ORDER — BENZONATATE 100 MG PO CAPS
100.0000 mg | ORAL_CAPSULE | Freq: Two times a day (BID) | ORAL | Status: DC | PRN
  Administered 2018-01-05 – 2018-01-07 (×4): 100 mg via ORAL
  Filled 2018-01-05 (×4): qty 1

## 2018-01-05 MED ORDER — ALBUMIN HUMAN 25 % IV SOLN: 50.0000 g | Freq: Once | INTRAVENOUS | Status: DC

## 2018-01-05 MED ORDER — ONDANSETRON HCL 4 MG/2ML IJ SOLN: 4.0000 mg | Freq: Four times a day (QID) | INTRAMUSCULAR | Status: DC | PRN

## 2018-01-05 MED ORDER — SPIRONOLACTONE 25 MG PO TABS
100.0000 mg | ORAL_TABLET | Freq: Every day | ORAL | Status: DC
  Administered 2018-01-05 – 2018-01-07 (×3): 100 mg via ORAL
  Filled 2018-01-05 (×4): qty 4

## 2018-01-05 MED ORDER — FUROSEMIDE 10 MG/ML IJ SOLN
40.0000 mg | Freq: Once | INTRAMUSCULAR | Status: AC
  Administered 2018-01-05: 40 mg via INTRAVENOUS
  Filled 2018-01-05: qty 4

## 2018-01-05 NOTE — ED Provider Notes (Signed)
[facility] EMERGENCY DEPARTMENT Provider Note   CSN: [id number] Arrival date & time: 01/05/18  1409     History   Chief Complaint Chief Complaint  Patient presents with  . Shortness of Breath    HPI Renee Rice is a 62 y.o. female.  HPI Patient has history of multifocal hepatocellular carcinoma.  She gets recurrent ascites.  Patient had paracentesis done yesterday.  She reports that she has had a persistent and severe cough for almost 3 weeks now.  She reports the cough has been predominantly dry.  She had chest x-ray done yesterday.  She states she checked her results in her electronic chart and observed that her chest x-ray showed a pneumonia.  Patient came to the emergency department seeking treatment.  She reports she is concerned because she has been symptomatic and she has severe comorbid illnesses.  She has not had a fever that she is aware of.  Denies any increase or change in abdominal pain since her paracentesis yesterday.  She reports she chronically has swelling in the legs left greater than right.  That is been the case since springtime.  No acute change. Past Medical History:  Diagnosis Date  . Allergic rhinitis   . Chronic viral hepatitis C ([place]) 04/27/2017   with cirrhosis  . Depression    in the past, no meds, no previous hospitalization   . Heart murmur     Patient Active Problem List   Diagnosis Date Noted  . Ascites 12/09/2017  . Fibrosis of liver 05/17/2017  . Chronic viral hepatitis C ([place]) 04/27/2017  . Cigarette smoker 02/10/2017    Past Surgical History:  Procedure Laterality Date  . IR ANGIOGRAM SELECTIVE EACH ADDITIONAL VESSEL  10/19/2017  . IR ANGIOGRAM SELECTIVE EACH ADDITIONAL VESSEL  10/19/2017  . IR ANGIOGRAM SELECTIVE EACH ADDITIONAL VESSEL  10/19/2017  . IR ANGIOGRAM SELECTIVE EACH ADDITIONAL VESSEL  10/19/2017  . IR ANGIOGRAM SELECTIVE EACH ADDITIONAL VESSEL  11/02/2017  . IR ANGIOGRAM SELECTIVE EACH ADDITIONAL VESSEL   11/02/2017  . IR ANGIOGRAM SELECTIVE EACH ADDITIONAL VESSEL  11/02/2017  . IR ANGIOGRAM VISCERAL SELECTIVE  10/19/2017  . IR ANGIOGRAM VISCERAL SELECTIVE  10/19/2017  . IR ANGIOGRAM VISCERAL SELECTIVE  11/02/2017  . IR EMBO ARTERIAL NOT HEMORR HEMANG INC GUIDE ROADMAPPING  10/19/2017  . IR EMBO TUMOR ORGAN ISCHEMIA INFARCT INC GUIDE ROADMAPPING  11/02/2017  . IR PARACENTESIS  12/04/2017  . IR PARACENTESIS  12/07/2017  . IR PARACENTESIS  12/10/2017  . IR PARACENTESIS  12/18/2017  . IR PARACENTESIS  12/31/2017  . IR PARACENTESIS  01/04/2018  . IR RADIOLOGIST EVAL & MGMT  09/18/2017  . IR RADIOLOGIST EVAL & MGMT  11/20/2017  . IR US GUIDE VASC ACCESS RIGHT  10/19/2017  . IR US GUIDE VASC ACCESS RIGHT  11/02/2017     OB History   No obstetric history on file.      Home Medications    Prior to Admission medications   Medication Sig Start Date End Date Taking? Authorizing Provider  acetaminophen (TYLENOL) 500 MG tablet Take 1,000 mg by mouth every 6 (six) hours as needed for mild pain.     [provider]  calcium carbonate (TUMS - DOSED IN MG ELEMENTAL CALCIUM) 500 MG chewable tablet Chew 1 tablet by mouth daily.    [provider]  furosemide (LASIX) 40 MG tablet Take 1 tablet (40 mg total) by mouth daily. 12/24/17   Doran Stabler, MD  guaiFENesin-codeine 100-10  MG/5ML syrup Take 10 mLs by mouth every 6 (six) hours as needed for cough. 01/01/18   Doran Stabler, MD  lactulose (CEPHULAC) 10 g packet Take 1 packet (10 g total) by mouth 2 (two) times daily. 01/01/18   Doran Stabler, MD  spironolactone (ALDACTONE) 100 MG tablet Take 1 tablet (100 mg total) by mouth daily. 12/24/17   Doran Stabler, MD    Family History Family History  Problem Relation Age of Onset  . Migraines Mother   . Cancer Father        biological father with lung to brain   . Migraines Maternal Grandmother     Social History Social History   Tobacco Use  . Smoking status:  Current Some Day Smoker    Packs/day: 0.25    Years: 30.00    Pack years: 7.50    Types: Cigarettes  . Smokeless tobacco: Never Used  Substance Use Topics  . Alcohol use: Yes    Comment: occassional 2 beers a month; never heavy use  . Drug use: No    Comment: remote h/o IV drug use, none in decades      Allergies   Patient has no known allergies.   Review of Systems Review of Systems 10 Systems reviewed and are negative for acute change except as noted in the HPI.   Physical Exam Updated Vital Signs BP (!) 147/83   Pulse (!) 103   Temp 97.8 F (36.6 C) (Oral)   Resp 18   Ht 5\' 11"  (1.803 m)   Wt 90.7 kg   SpO2 96%   BMI 27.89 kg/m   Physical Exam Constitutional:      Appearance: Normal appearance.  HENT:     Head: Normocephalic and atraumatic.  Cardiovascular:     Rate and Rhythm: Normal rate and regular rhythm.     Pulses: Normal pulses.  Pulmonary:     Effort: Pulmonary effort is normal.     Breath sounds: Normal breath sounds.  Abdominal:     Comments: Abdomen is mildly distended.  Soft.  No guarding.  Musculoskeletal: Normal range of motion.     Comments: 1+ pitting edema bilateral lower extremities.  Slightly greater left than right.  Calves are soft.  Skin:    General: Skin is warm and dry.  Neurological:     General: No focal deficit present.     Mental Status: She is alert. She is disoriented.  Psychiatric:        Mood and Affect: Mood normal.      ED Treatments / Results  Labs (all labs ordered are listed, but only abnormal results are displayed) Labs Reviewed  COMPREHENSIVE METABOLIC PANEL - Abnormal; Notable for the following components:      Result Value   Sodium 131 (*)    Chloride 97 (*)    Glucose, Bld 125 (*)    Creatinine, Ser 1.07 (*)    Calcium 8.6 (*)    Total Protein 6.4 (*)    Albumin 2.7 (*)    AST 83 (*)    Alkaline Phosphatase 160 (*)    Total Bilirubin 3.8 (*)    GFR calc non Af Amer 56 (*)    All other  components within normal limits  CBC WITH DIFFERENTIAL/PLATELET - Abnormal; Notable for the following components:   Platelets 76 (*)    Lymphs Abs 0.5 (*)    Monocytes Absolute 1.1 (*)    All other components  within normal limits  PROTIME-INR - Abnormal; Notable for the following components:   Prothrombin Time 19.7 (*)    All other components within normal limits  URINE CULTURE  CULTURE, BLOOD (ROUTINE X 2)  CULTURE, BLOOD (ROUTINE X 2)  LIPASE, BLOOD  URINALYSIS, ROUTINE W REFLEX MICROSCOPIC  I-STAT TROPONIN, ED    EKG EKG Interpretation  Date/Time:  Saturday January 05 2018 14:18:22 EST Ventricular Rate:  102 PR Interval:    QRS Duration: 93 QT Interval:  328 QTC Calculation: 428 R Axis:   45 Text Interpretation:  Sinus tachycardia Abnormal R-wave progression, early transition no change from previous Confirmed by Charlesetta Shanks [phone]) on 01/05/2018 3:22:59 PM   Radiology Dg Chest 2 View  Result Date: 01/04/2018 CLINICAL DATA:  Chronic cough.  Progressive shortness of breath. EXAM: CHEST - 2 VIEW COMPARISON:  Two-view chest x-ray 12/09/2017 FINDINGS: The heart size is normal. New left lower lobe airspace disease and effusion is present. There is minimal atelectasis at the right base. Upper lung fields are clear. The visualized soft tissues and bony thorax are. IMPRESSION: 1. Left lower lobe pneumonia and small effusion. 2. Minimal atelectasis at the right base. Electronically Signed   By: San Morelle M.D.   On: 01/04/2018 14:29   Ir Paracentesis  Result Date: 01/04/2018 INDICATION: Patient with history of hepatitis C, cirrhosis, HCC with prior hepatic Y-90 radioembolization, and recurrent ascites. Request is made for therapeutic paracentesis. EXAM: ULTRASOUND GUIDED THERAPEUTIC PARACENTESIS MEDICATIONS: 20 mL of 1% lidocaine using spinal needle COMPLICATIONS: None immediate. PROCEDURE: Informed written consent was obtained from the patient after a discussion of the  risks, benefits and alternatives to treatment. A timeout was performed prior to the initiation of the procedure. Initial ultrasound scanning demonstrates a moderate amount of ascites within the right lower abdominal quadrant. The right lower abdomen was prepped and draped in the usual sterile fashion. 1% lidocaine was used for local anesthesia. Following this, a 19 gauge, 10-cm, Yueh catheter was introduced. An ultrasound image was saved for documentation purposes. The paracentesis was performed. The catheter was removed and a dressing was applied. The patient tolerated the procedure well without immediate post procedural complication. FINDINGS: A total of approximately 3.4 L of hazy gold fluid was removed. IMPRESSION: Successful ultrasound-guided paracentesis yielding 3.4 L of peritoneal fluid. Read by: Earley Abide, PA-C Electronically Signed   By: Aletta Edouard M.D.   On: 01/04/2018 12:14    Procedures Procedures (including critical care time)  Medications Ordered in ED Medications  cefTRIAXone (ROCEPHIN) 1 g in sodium chloride 0.9 % 100 mL IVPB (has no administration in time range)  azithromycin (ZITHROMAX) 500 mg in sodium chloride 0.9 % 250 mL IVPB (has no administration in time range)     Initial Impression / Assessment and Plan / ED Course  I have reviewed the triage vital signs and the nursing notes.  Pertinent labs & imaging results that were available during my care of the patient were reviewed by me and considered in my medical decision making (see chart for details).    Patient presents with symptoms as outlined above.  Chest x-ray from yesterday does not show area of consolidation or effusion.  She has had persistent cough.  She has significant comorbid illness.  Empiric antibiotics initiated.  Plan for admission.  Final Clinical Impressions(s) / ED Diagnoses   Final diagnoses:  Community acquired pneumonia of left lower lobe of lung ([place])  Severe comorbid illness    ED  Discharge Orders  None       Charlesetta Shanks, MD 01/05/18 [phone]

## 2018-01-05 NOTE — ED Triage Notes (Signed)
Pt SOB a couple of weeks w cough, diagnosed w L Lower lung pneumonia, got outpt pericentiesis and chest x-ray and saw diagnosis in MyChart yesterday and concerned  Pt is a liver cancer pt

## 2018-01-05 NOTE — ED Triage Notes (Signed)
Diet ordered 

## 2018-01-05 NOTE — ED Notes (Signed)
Pt advised she cant urinate right at the moment.

## 2018-01-05 NOTE — H&P (Addendum)
[facility] Admission History and Physical Service Pager: [phone]  Patient name: Renee Rice Medical record number: [id number] Date of birth: 26-Jul-1955 Age: 62 y.o. Gender: female  Primary Care Provider: System, Pcp Not In Consultants: None  Code Status: Full   Chief Complaint: SOB, coughing   Assessment and Plan: Renee Rice is a 62 y.o. female presenting with progressive shortness of breath and cough, found to have a left lower lobe pneumonia on CXR.  PMH is significant for chronic hepatitis C with hepatocellular carcinoma and ascites, tobacco use, thrombocytopenia, hypoalbuminemia.    SOB, likely 2/2 to CAP/LL Pneumonia: Subacute, worsening.  Pt reports a few week history of progressive SOB, orthopnea, and dry cough, despite paracentesis procedures for her ascites. Afebrile, VSS, decreased air movement in left lower lobe, however satting well on RA without increased WOB.  No leukocytosis.  EKG sinus rhythm.  Troponin negative x1. CXR on 12/20 showing LLL pneumonia and small effusion.  Shortness of breath likely secondary to left lower lobe pneumonia seen on chest x-ray.  Would expect a more acute presentation with fever and leukocytosis, however possible that patient is not mounting an appropriate response in the setting of immunocompromise state due to below.  Could also consider pleural effusion precipitating her symptomatology, given her hypoalbuminemia (2.7), frequent ascites, and more distinct area seen on CXR.  While her Wells score for PE is 2.5 (for malignancy and low grade tachycardia), moderate risk, low suspicion for PE as this is been more subacute in nature.  Lastly, could consider onset of heart failure given endorsing orthopnea, however with no previous cardiac concerns, her orthopnea is likely more related to her overall hypervolemia in the setting of cirrhosis. No previous Echo.  - Admit to inpatient family medicine, observation, attending  Dr. Mingo Amber - Continue IV azithromycin (12/21- ) and ceftriaxone (12/21-), re-evaluate necessity 12/22 - Continuous pulse ox - Ambulate with pulse ox prior to discharge, PT eval   - Obtain echocardiogram - Monitor CBC, CMP  - EKG in the a.m. - Follow-up blood cultures - Vitals per routine  Hepatocellular carcinoma, in setting of Chronic Hepatitis C/Liver Cirrhosis: Chronic, decompensating.  Diagnosed a few months ago. Meld-Na score 23, 14-15% 90-day mortality. S/p 2 sessions of Y90 sirspheres radio embolization (radiation therapy + embolization) to her right hepatic lobe in October 2019.  Synthetic function poor: Albumin 2.7, platelets 76, INR 1.69. Has frequent paracentesis for ascites, approximately biweekly with last session 12/20, usually removes 2-4L of fluid at a time. Follows with Dr. Loletha Carrow with [facility]. - Continue Lasix 40 IV mg and spironolactone 100 mg daily - Monitor fluid status, last paracentesis 12/20 - Ensure follow-up with oncology and GI - Monitor CMP  Elevated creatinine: Acute. CR 1.07, 0.78 on 12/12.  Likely slight bump with continuing decompensation of cirrhosis and fluid overload, did not take her Lasix overnight. -Monitor CMP -Continue home diuresis  Hyponatremia: Subacute. Na 131 on admit, previously 133-140.  Likely hypervolemic hypotonic in the setting of liver cirrhosis and hypoalbuminemia. -Continue home diuresis -Monitor CMP  Asymptomatic bacteriuria:  U/A hazy, positive nitrites, and bacteria present. Patient denies any dysuria, frequency, and urgency. -Monitor for symptoms -Follow-up urine culture  FEN/GI: Heart healthy diet Prophylaxis: SCDs  Disposition: Admit to observation, attending Dr. Mingo Amber   History of Present Illness:  Renee Rice is a 62 y.o. female, with a past history significant for chronic hepatitis C and hepatocellular carcinoma with ascites, that presents with progressive shortness of breath and cough over  the last few weeks.   She often becomes short of breath when it is time for another paracentesis, however usually her SOB resolves following the procedure.  Unfortunately, for the past few weeks even after her paracentesis procedures, she has been remaining more short of breath and now also has a subacute cough. She often has coughing fits aggressive enough to cause her to vomit.  Denies sputum production most the time, however has had some green sputum occasionally. Her last paracentesis was yesterday, 12/20, and without any improvement in her breathing. She told her GI doc, Dr. Loletha Carrow, about the symptoms during their appointment this week, they obtained an x-ray at that time.  She received these results through my chart, stated she had a left lower lobe pneumonia and presented to the ED for further evaluation and treatment.  She denies any associated fever, chills, nausea, abdominal pain above baseline, change in bowel movements, dysuria, or urinary frequency.  However, she does endorse some slight orthopnea above her baseline, denies any increase in her lower extremity swelling.  ED Course: On arrival, she was hemodynamically stable and in no acute distress.  CXR from 12/28 showing a left lower lobe pneumonia and slight effusion.  No leukocytosis. Troponin 0 0.03.  EKG sinus rhythm.  U/a hazy, positive nitrates, and present bacteria.  She was started on IV azithromycin and ceftriaxone for the pneumonia.   Review Of Systems: Per HPI with the following additions:   Review of Systems  Constitutional: Negative for chills, fever and malaise/fatigue.  Respiratory: Positive for cough, sputum production and shortness of breath. Negative for hemoptysis and wheezing.   Cardiovascular: Negative for chest pain, palpitations and orthopnea.  Gastrointestinal: Positive for abdominal pain and vomiting.  Genitourinary: Negative for dysuria, frequency and urgency.  Neurological: Positive for headaches. Negative for dizziness, focal  weakness, loss of consciousness and weakness.    Patient Active Problem List   Diagnosis Date Noted  . Community acquired pneumonia 01/05/2018  . Ascites 12/09/2017  . Fibrosis of liver 05/17/2017  . Chronic viral hepatitis C ([place]) 04/27/2017  . Cigarette smoker 02/10/2017    Past Medical History: Past Medical History:  Diagnosis Date  . Allergic rhinitis   . Chronic viral hepatitis C ([place]) 04/27/2017   with cirrhosis  . Depression    in the past, no meds, no previous hospitalization   . Heart murmur     Past Surgical History: Past Surgical History:  Procedure Laterality Date  . IR ANGIOGRAM SELECTIVE EACH ADDITIONAL VESSEL  10/19/2017  . IR ANGIOGRAM SELECTIVE EACH ADDITIONAL VESSEL  10/19/2017  . IR ANGIOGRAM SELECTIVE EACH ADDITIONAL VESSEL  10/19/2017  . IR ANGIOGRAM SELECTIVE EACH ADDITIONAL VESSEL  10/19/2017  . IR ANGIOGRAM SELECTIVE EACH ADDITIONAL VESSEL  11/02/2017  . IR ANGIOGRAM SELECTIVE EACH ADDITIONAL VESSEL  11/02/2017  . IR ANGIOGRAM SELECTIVE EACH ADDITIONAL VESSEL  11/02/2017  . IR ANGIOGRAM VISCERAL SELECTIVE  10/19/2017  . IR ANGIOGRAM VISCERAL SELECTIVE  10/19/2017  . IR ANGIOGRAM VISCERAL SELECTIVE  11/02/2017  . IR EMBO ARTERIAL NOT HEMORR HEMANG INC GUIDE ROADMAPPING  10/19/2017  . IR EMBO TUMOR ORGAN ISCHEMIA INFARCT INC GUIDE ROADMAPPING  11/02/2017  . IR PARACENTESIS  12/04/2017  . IR PARACENTESIS  12/07/2017  . IR PARACENTESIS  12/10/2017  . IR PARACENTESIS  12/18/2017  . IR PARACENTESIS  12/31/2017  . IR PARACENTESIS  01/04/2018  . IR RADIOLOGIST EVAL & MGMT  09/18/2017  . IR RADIOLOGIST EVAL & MGMT  11/20/2017  . IR US  GUIDE VASC ACCESS RIGHT  10/19/2017  . IR US GUIDE VASC ACCESS RIGHT  11/02/2017    Social History: Social History   Tobacco Use  . Smoking status: Current Some Day Smoker    Packs/day: 0.25    Years: 30.00    Pack years: 7.50    Types: Cigarettes  . Smokeless tobacco: Never Used  Substance Use Topics  . Alcohol use: Yes     Comment: occassional 2 beers a month; never heavy use  . Drug use: No    Comment: remote h/o IV drug use, none in decades    Please also refer to relevant sections of EMR.  Family History: Family History  Problem Relation Age of Onset  . Migraines Mother   . Cancer Father        biological father with lung to brain   . Migraines Maternal Grandmother     Allergies and Medications: No Known Allergies No current facility-administered medications on file prior to encounter.    Current Outpatient Medications on File Prior to Encounter  Medication Sig Dispense Refill  . acetaminophen (TYLENOL) 500 MG tablet Take 1,000 mg by mouth every 6 (six) hours as needed for mild pain.     . calcium carbonate (TUMS - DOSED IN MG ELEMENTAL CALCIUM) 500 MG chewable tablet Chew 1 tablet by mouth daily as needed for heartburn.     . furosemide (LASIX) 40 MG tablet Take 1 tablet (40 mg total) by mouth daily. 30 tablet 2  . guaiFENesin-codeine 100-10 MG/5ML syrup Take 10 mLs by mouth every 6 (six) hours as needed for cough. 180 mL 0  . spironolactone (ALDACTONE) 100 MG tablet Take 1 tablet (100 mg total) by mouth daily. 30 tablet 2  . lactulose (CEPHULAC) 10 g packet Take 1 packet (10 g total) by mouth 2 (two) times daily. (Patient not taking: Reported on 01/05/2018) 60 each 3    Objective: BP 120/73 (BP Location: Right Arm)   Pulse (!) 104   Temp 97.6 F (36.4 C) (Oral)   Resp (!) 22   Ht 5\' 11"  (1.803 m)   Wt 90.7 kg   SpO2 95%   BMI 27.89 kg/m  Exam: General: 62 year old caucasian female, Alert, NAD HEENT: NCAT, MMM, oropharynx nonerythematous  Cardiac: RRR, intermittently tachycardic to low 100s, no m/g/r Lungs: Clear bilaterally with exception of decreased air movement in her right lower lobe.  No increased work of breathing, satting well on RA. Abdomen: soft, slightly tender to RUQ, slightly distended normoactive BS Msk: Moves all extremities spontaneously  Ext: Warm, dry, 2+ distal  pulses, 1+ pitting edema bilateral lower extremity  Labs and Imaging: CBC BMET  Recent Labs  Lab 01/05/18 1427  WBC 8.0  HGB 12.8  HCT 38.4  PLT 76*   Recent Labs  Lab 01/05/18 1427  NA 131*  K 4.4  CL 97*  CO2 24  BUN 17  CREATININE 1.07*  GLUCOSE 125*  CALCIUM 8.6*     Dg Chest 2 View  Result Date: 01/04/2018 CLINICAL DATA:  Chronic cough.  Progressive shortness of breath. EXAM: CHEST - 2 VIEW COMPARISON:  Two-view chest x-ray 12/09/2017 FINDINGS: The heart size is normal. New left lower lobe airspace disease and effusion is present. There is minimal atelectasis at the right base. Upper lung fields are clear. The visualized soft tissues and bony thorax are. IMPRESSION: 1. Left lower lobe pneumonia and small effusion. 2. Minimal atelectasis at the right base. Electronically Signed  By: San Morelle M.D.   On: 01/04/2018 14:29   Ir Paracentesis  Result Date: 01/04/2018 INDICATION: Patient with history of hepatitis C, cirrhosis, HCC with prior hepatic Y-90 radioembolization, and recurrent ascites. Request is made for therapeutic paracentesis. EXAM: ULTRASOUND GUIDED THERAPEUTIC PARACENTESIS MEDICATIONS: 20 mL of 1% lidocaine using spinal needle COMPLICATIONS: None immediate. PROCEDURE: Informed written consent was obtained from the patient after a discussion of the risks, benefits and alternatives to treatment. A timeout was performed prior to the initiation of the procedure. Initial ultrasound scanning demonstrates a moderate amount of ascites within the right lower abdominal quadrant. The right lower abdomen was prepped and draped in the usual sterile fashion. 1% lidocaine was used for local anesthesia. Following this, a 19 gauge, 10-cm, Yueh catheter was introduced. An ultrasound image was saved for documentation purposes. The paracentesis was performed. The catheter was removed and a dressing was applied. The patient tolerated the procedure well without immediate post  procedural complication. FINDINGS: A total of approximately 3.4 L of hazy gold fluid was removed. IMPRESSION: Successful ultrasound-guided paracentesis yielding 3.4 L of peritoneal fluid. Read by: Earley Abide, PA-C Electronically Signed   By: Aletta Edouard M.D.   On: 01/04/2018 12:14    Patriciaann Clan, DO 01/05/2018, 6:01 PM PGY-1, [facility] pager: [phone], text pages welcome ------------------------------------------------------------------------------------------------------------------------ Upper Level Addendum: I have seen and evaluated this patient along with Dr. Higinio Plan and reviewed the above note, making necessary revisions in brown.  Guadalupe Dawn MD PGY-2 Family Medicine Resident

## 2018-01-06 ENCOUNTER — Observation Stay (HOSPITAL_BASED_OUTPATIENT_CLINIC_OR_DEPARTMENT_OTHER): Payer: Managed Care, Other (non HMO)

## 2018-01-06 DIAGNOSIS — E871 Hypo-osmolality and hyponatremia: Secondary | ICD-10-CM | POA: Diagnosis not present

## 2018-01-06 DIAGNOSIS — C220 Liver cell carcinoma: Secondary | ICD-10-CM | POA: Diagnosis not present

## 2018-01-06 DIAGNOSIS — B182 Chronic viral hepatitis C: Secondary | ICD-10-CM

## 2018-01-06 DIAGNOSIS — R69 Illness, unspecified: Secondary | ICD-10-CM | POA: Diagnosis not present

## 2018-01-06 DIAGNOSIS — J181 Lobar pneumonia, unspecified organism: Secondary | ICD-10-CM | POA: Diagnosis not present

## 2018-01-06 DIAGNOSIS — R0602 Shortness of breath: Secondary | ICD-10-CM | POA: Diagnosis not present

## 2018-01-06 DIAGNOSIS — K7469 Other cirrhosis of liver: Secondary | ICD-10-CM

## 2018-01-06 DIAGNOSIS — F17200 Nicotine dependence, unspecified, uncomplicated: Secondary | ICD-10-CM

## 2018-01-06 LAB — CBC WITH DIFFERENTIAL/PLATELET
Abs Immature Granulocytes: 0.05 10*3/uL (ref 0.00–0.07)
Basophils Absolute: 0.1 10*3/uL (ref 0.0–0.1)
Basophils Relative: 1 %
Eosinophils Absolute: 0.2 10*3/uL (ref 0.0–0.5)
Eosinophils Relative: 3 %
HCT: 34.8 % — ABNORMAL LOW (ref 36.0–46.0)
Hemoglobin: 12.3 g/dL (ref 12.0–15.0)
Immature Granulocytes: 1 %
Lymphocytes Relative: 9 %
Lymphs Abs: 0.7 10*3/uL (ref 0.7–4.0)
MCH: 32.5 pg (ref 26.0–34.0)
MCHC: 35.3 g/dL (ref 30.0–36.0)
MCV: 91.8 fL (ref 80.0–100.0)
Monocytes Absolute: 0.9 10*3/uL (ref 0.1–1.0)
Monocytes Relative: 13 %
Neutro Abs: 5.3 10*3/uL (ref 1.7–7.7)
Neutrophils Relative %: 73 %
Platelets: 76 10*3/uL — ABNORMAL LOW (ref 150–400)
RBC: 3.79 MIL/uL — AB (ref 3.87–5.11)
RDW: 15 % (ref 11.5–15.5)
WBC: 7.2 10*3/uL (ref 4.0–10.5)
nRBC: 0 % (ref 0.0–0.2)

## 2018-01-06 LAB — COMPREHENSIVE METABOLIC PANEL
ALT: 35 U/L (ref 0–44)
AST: 74 U/L — AB (ref 15–41)
Albumin: 2.5 g/dL — ABNORMAL LOW (ref 3.5–5.0)
Alkaline Phosphatase: 175 U/L — ABNORMAL HIGH (ref 38–126)
Anion gap: 10 (ref 5–15)
BUN: 15 mg/dL (ref 8–23)
CO2: 22 mmol/L (ref 22–32)
Calcium: 8.3 mg/dL — ABNORMAL LOW (ref 8.9–10.3)
Chloride: 99 mmol/L (ref 98–111)
Creatinine, Ser: 0.85 mg/dL (ref 0.44–1.00)
GFR calc Af Amer: >60 mL/min (ref >60)
Glucose, Bld: 105 mg/dL — ABNORMAL HIGH (ref 70–99)
POTASSIUM: 4.3 mmol/L (ref 3.5–5.1)
Sodium: 131 mmol/L — ABNORMAL LOW (ref 135–145)
Total Bilirubin: 3.2 mg/dL — ABNORMAL HIGH (ref 0.3–1.2)
Total Protein: 5.8 g/dL — ABNORMAL LOW (ref 6.5–8.1)

## 2018-01-06 LAB — ECHOCARDIOGRAM COMPLETE
Height: 71 in
WEIGHTICAEL: 3200 [oz_av]

## 2018-01-06 LAB — URINE CULTURE: Culture: <10000 — AB

## 2018-01-06 NOTE — Progress Notes (Signed)
Family Medicine Teaching Service Daily Progress Note Intern Pager: [phone]  Patient name: Renee Rice Medical record number: [id number] Date of birth: 02-Feb-1955 Age: 62 y.o. Gender: female  Primary Care Provider: System, Pcp Not In Consultants: none Code Status: full  Pt Overview and Major Events to Date:  12/21 admitted  Assessment and Plan: Renee Rice is a 62 y.o. female presenting with progressive shortness of breath and cough, found to have a left lower lobe pneumonia on CXR.  PMH is significant for chronic hepatitis C with hepatocellular carcinoma and ascites, tobacco use, thrombocytopenia, hypoalbuminemia. Paracentesis 12/20.  CAP/LL Pneumonia: Subacute- improving Continues to have frequent coughing "spells" with green sputum production worsened with exertion. Patient remains on room air. Has had intermittent episodes of tachycardia up to 109, and tachypnea up to 22 overnight. Echo showed normal LV size eEF 65-70% with normal wall motion, G1DD, left pleural effusion. Blood cultures no growth <24 hours. WBC count improved to 7.2 today. - continue azithro (12/21-12/28), ceftriaxone (12/21-12/23 and then switch to PO omnicef for discharge until 12/28) -tessalon pearls - incentive spirometer - Ambulate with pulse ox prior to discharge, PT eval   - Monitor CBC, CMP - Vitals per routine  Hepatocellular carcinoma, in setting of Chronic Hepatitis C/Liver Cirrhosis: Chronic, decompensating.  Albumin down to 2.5 today. Platelets stable at 76 today. Weight not recorded yet today. Last paracentesis 12/20. Follows with Dr. Loletha Carrow with  GI. - Continue Lasix 40 IV mg and spironolactone 100 mg daily - Monitor fluid status, last paracentesis 12/20 - Ensure follow-up with oncology and GI - Monitor CMP  Elevated creatinine: resolved. Creatinine improved to 0.85 today -Monitor CMP daily -Continue home diuresis  Hyponatremia: Subacute. stable Na stable at 131  today. -Continue home diuresis -Monitor CMP and symptoms of hyponatremia  Asymptomatic bacteriuria:  U/A hazy, positive nitrites, and bacteria present. Patient denies any dysuria, frequency, and urgency. Treatment covered with PNA antibiotics. Urine culture had insignificant growth. -Monitor for symptoms - monitor I/Os  FEN/GI: Heart healthy diet Prophylaxis: SCDs  Disposition: likely discharge tomorrow  Subjective:  Patient states she did not sleep well last night 2/2 coughing. She does not like the hospital food and wants more fruit. She also wants tessalon pearls as they have helped her coughing in the past. She is open to going home today but has continued green sputum and appears to be ill still.  She states that she still needs help with ambulation to the bathroom.  Objective: Temp:  [97.6 F (36.4 C)-98.4 F (36.9 C)] 98.4 F (36.9 C) (12/21 2253) Pulse Rate:  [98-109] 109 (12/21 2253) Resp:  [18-23] 20 (12/21 2253) BP: (120-147)/(68-83) 122/74 (12/21 2253) SpO2:  [94 %-96 %] 95 % (12/21 2253) Weight:  [90.7 kg] 90.7 kg (12/21 1417) Physical Exam: General: alert, oriented, pleasant, ill-appearing Cardiovascular: RRR, no murmur appreciated Respiratory: no wheezing, rhonchi present. Deep inhalation caused coughing throughout exam Abdomen: softly distended, non-tender, positive BS diffusely Extremities: non edematous, moving all equally and appropriately  Laboratory: Recent Labs  Lab 01/05/18 1427 01/06/18 0223  WBC 8.0 7.2  HGB 12.8 12.3  HCT 38.4 34.8*  PLT 76* 76*   Recent Labs  Lab 01/05/18 1427 01/06/18 0223  NA 131* 131*  K 4.4 4.3  CL 97* 99  CO2 24 22  BUN 17 15  CREATININE 1.07* 0.85  CALCIUM 8.6* 8.3*  PROT 6.4* 5.8*  BILITOT 3.8* 3.2*  ALKPHOS 160* 175*  ALT 41 35  AST 83* 74*  GLUCOSE 125* 105*  Imaging/Diagnostic Tests: Dg Chest 2 View  Result Date: 01/04/2018 CLINICAL DATA:  Chronic cough.  Progressive shortness of breath.  EXAM: CHEST - 2 VIEW COMPARISON:  Two-view chest x-ray 12/09/2017 FINDINGS: The heart size is normal. New left lower lobe airspace disease and effusion is present. There is minimal atelectasis at the right base. Upper lung fields are clear. The visualized soft tissues and bony thorax are. IMPRESSION: 1. Left lower lobe pneumonia and small effusion. 2. Minimal atelectasis at the right base. Electronically Signed   By: San Morelle M.D.   On: 01/04/2018 14:29   12/22 Echo: normal LV size eEF 65-70% with normal wall motion, G1DD, left pleural effusion.    Richarda Osmond, DO 01/06/2018, 11:19 AM PGY-1, [place] Intern pager: [phone], text pages welcome

## 2018-01-06 NOTE — Discharge Summary (Signed)
[facility] Discharge Summary  Patient name: Renee Rice Medical record number: [id number] Date of birth: 02/13/1955 Age: 62 y.o. Gender: female Date of Admission: 01/05/2018  Date of Discharge: 12/23 Admitting Physician: Charlesetta Shanks, MD  Primary Care Provider: System, Pcp Not In Consultants: none  Indication for Hospitalization: uncomplicated LLL PNA.   Discharge Diagnoses/Problem List:  Uncomplicated LLL PNA Chronic hepatitis C Hepatocellular carcinoma H/o ascites Former Tobacco use disorder Thrombocytopenia Hypoalbuminemia  Disposition: discharge home with antibiotic therapy, home oxygen and PCP follow up  Discharge Condition: improved  Discharge Exam:  General: NAD, pleasant, able to participate in exam Cardiac: RRR, normal heart sounds, no murmurs. 2+ radial and PT pulses bilaterally Respiratory: CTAB, normal effort, No wheezes, rales or rhonchi- coughing after exam triggered by deep inhalations Abdomen: soft-distended, +BS Extremities: no edema or cyanosis. WWP. Skin: warm and dry, no rashes noted Neuro: alert and oriented x4, no focal deficits Psych: Normal affect and mood  Brief Hospital Course:  Renee Rice is a 62 year old female admitted to the hospital with progressive shortness of breath, orthopnea and dry cough. chest x-ray from 12/20 in her PCP's office found left lower lobe pneumonia and small effusion. Echo during admission was relatively normal and also noted the left pleural effusion. She was started on IV Azithromycin and Ceftriaxone. She had a productive cough and dyspnea. She was transitioned to azithromycin and omnicef in preparation for discharge. She clinically improved on day of discharge with decreased coughing, no sputum production and no dyspnea, but she was having tachycardia, and hypoxia with ambulation requiring 4L O2. A CT angiogram was ordered which revealed small left pleural effusion, atelectasis, opacity  in RUL indicating pneumonia, but no PE.Throughout admission, her WBC count remained normal and she remained afebrile. Blood cultures were negative x2 days. She was medically stable and cleared for discharge. She was discharged with a full 7 days of antibiotics (to be completed Dec 30th) and home oxygen which can be discontinued by PCP when respiratory status improves. Patient was also prescribed an incentive spirometer and encouraged to use.  Issues for Follow Up:  1. Hospital follow up for pneumonia treatment completion (azithro and omnicef through Dec 30) and discontinuation of home oxygen therapy when appropriate. Would consider PFTs when healthy again. 2. Follow up with oncology/GI for malignant ascites. Continue therapeutic paracentesis (scheduled Dec 30)  Significant Procedures:  ECHO 12/22 CTA chest 12/23  Significant Labs and Imaging:  Recent Labs  Lab 01/05/18 1427 01/06/18 0223  WBC 8.0 7.2  HGB 12.8 12.3  HCT 38.4 34.8*  PLT 76* 76*   Recent Labs  Lab 01/05/18 1427 01/06/18 0223  NA 131* 131*  K 4.4 4.3  CL 97* 99  CO2 24 22  GLUCOSE 125* 105*  BUN 17 15  CREATININE 1.07* 0.85  CALCIUM 8.6* 8.3*  ALKPHOS 160* 175*  AST 83* 74*  ALT 41 35  ALBUMIN 2.7* 2.5*   Blood cultures negative x2 days Echo showed normal LV size eEF 65-70% with normal wall motion, G1DD, left pleural effusion. CTA chest= no PE, positive pleural effusion, PNA, atelectasis  Results/Tests Pending at Time of Discharge: none  Discharge Medications:  Allergies as of 01/07/2018   No Known Allergies     Medication List    TAKE these medications   acetaminophen 500 MG tablet Commonly known as:  TYLENOL Take 1,000 mg by mouth every 6 (six) hours as needed for mild pain.   azithromycin 250 MG tablet Commonly known as:  ZITHROMAX Take 1 tablet (250 mg total) by mouth daily.   benzonatate 100 MG capsule Commonly known as:  TESSALON Take 1 capsule (100 mg total) by mouth 3 (three) times  daily as needed for up to 5 days for cough.   calcium carbonate 500 MG chewable tablet Commonly known as:  TUMS - dosed in mg elemental calcium Chew 1 tablet by mouth daily as needed for heartburn.   cefdinir 300 MG capsule Commonly known as:  OMNICEF Take 1 capsule (300 mg total) by mouth every 12 (twelve) hours for 7 days. (twice per day)   furosemide 40 MG tablet Commonly known as:  LASIX Take 1 tablet (40 mg total) by mouth daily.   guaiFENesin-codeine 100-10 MG/5ML syrup Take 10 mLs by mouth every 6 (six) hours as needed for cough.   Influenza vac split quadrivalent PF 0.5 ML injection Commonly known as:  FLUARIX Inject 0.5 mLs into the muscle tomorrow at 10 am for 1 dose.   lactulose 10 g packet Commonly known as:  CEPHULAC Take 1 packet (10 g total) by mouth 2 (two) times daily.   spironolactone 100 MG tablet Commonly known as:  ALDACTONE Take 1 tablet (100 mg total) by mouth daily.            Durable Medical Equipment  (From admission, onward)         Start     Ordered   01/07/18 0000  For home use only DME oxygen    Comments:  To be used until follow up  Question Answer Comment  Mode or (Route) Nasal cannula   Liters per Minute 4   Frequency Continuous (stationary and portable oxygen unit needed)   Oxygen delivery system Gas      01/07/18 1709          Discharge Instructions: Please refer to Patient Instructions section of EMR for full details.  Patient was counseled important signs and symptoms that should prompt return to medical care, changes in medications, dietary instructions, activity restrictions, and follow up appointments.   Follow-Up Appointments:   Richarda Osmond, DO 01/07/2018, 8:13 AM PGY-1, [place]

## 2018-01-06 NOTE — Evaluation (Signed)
SATURATION QUALIFICATIONS: (This note is used to comply with regulatory documentation for home oxygen)  Patient Saturations on Room Air at Rest = 93%  Patient Saturations on Room Air while Ambulating = 89%  Patient Saturations on 1 Liters of oxygen while Ambulating = 93%  Please briefly explain why patient needs home oxygen: Pt does not qualify for home O2, although she did desaturate with activity and have shortness of breath and cough.

## 2018-01-06 NOTE — Progress Notes (Signed)
  Echocardiogram 2D Echocardiogram has been performed.  Johny Chess 01/06/2018, 11:06 AM

## 2018-01-07 ENCOUNTER — Observation Stay (HOSPITAL_COMMUNITY): Payer: Managed Care, Other (non HMO)

## 2018-01-07 ENCOUNTER — Other Ambulatory Visit: Payer: Self-pay

## 2018-01-07 DIAGNOSIS — R180 Malignant ascites: Secondary | ICD-10-CM

## 2018-01-07 MED ORDER — INFLUENZA VAC SPLIT QUAD 0.5 ML IM SUSY: 0.5000 mL | PREFILLED_SYRINGE | INTRAMUSCULAR | 0 refills | Status: AC

## 2018-01-07 MED ORDER — CEFDINIR 300 MG PO CAPS: 300.0000 mg | ORAL_CAPSULE | Freq: Two times a day (BID) | ORAL | 0 refills | Status: AC

## 2018-01-07 MED ORDER — CEFDINIR 300 MG PO CAPS
300.0000 mg | ORAL_CAPSULE | Freq: Two times a day (BID) | ORAL | Status: DC
  Administered 2018-01-07 (×2): 300 mg via ORAL
  Filled 2018-01-07 (×2): qty 1

## 2018-01-07 MED ORDER — AZITHROMYCIN 250 MG PO TABS: 250.0000 mg | ORAL_TABLET | Freq: Every day | ORAL | 0 refills | Status: DC

## 2018-01-07 MED ORDER — ACETAMINOPHEN 325 MG PO TABS
650.0000 mg | ORAL_TABLET | Freq: Four times a day (QID) | ORAL | Status: DC | PRN
  Administered 2018-01-07 (×2): 650 mg via ORAL
  Filled 2018-01-07 (×2): qty 2

## 2018-01-07 MED ORDER — PANTOPRAZOLE SODIUM 20 MG PO TBEC
20.0000 mg | DELAYED_RELEASE_TABLET | Freq: Every day | ORAL | Status: DC
  Administered 2018-01-07: 20 mg via ORAL
  Filled 2018-01-07: qty 1

## 2018-01-07 MED ORDER — IOPAMIDOL (ISOVUE-370) INJECTION 76%
INTRAVENOUS | Status: AC
  Administered 2018-01-07: 100 mL
  Filled 2018-01-07: qty 100

## 2018-01-07 MED ORDER — BENZONATATE 100 MG PO CAPS: 100.0000 mg | ORAL_CAPSULE | Freq: Three times a day (TID) | ORAL | 0 refills | Status: AC | PRN

## 2018-01-07 MED ORDER — AZITHROMYCIN 250 MG PO TABS
250.0000 mg | ORAL_TABLET | Freq: Every day | ORAL | Status: DC
  Administered 2018-01-07: 250 mg via ORAL
  Filled 2018-01-07: qty 1

## 2018-01-07 NOTE — Progress Notes (Signed)
SATURATION QUALIFICATIONS: (This note is used to comply with regulatory documentation for home oxygen)  Patient Saturations on Room Air at Rest = 89%  Patient Saturations on Room Air while Ambulating = 84%  Patient Saturations on 4 Liters of oxygen while Ambulating = 94%   Patient tolerated walk well. No complaints of shortness of breath, dizziness, lightheadedness, or other symptoms throughout walk.   Kinnie Scales, RN 01/07/18 10:20 AM

## 2018-01-07 NOTE — Progress Notes (Signed)
Patient discharged to home with husband with all belongings at 20:31, IV sites removed, all questions answered, home Oxygen tank with patient upon discharge.

## 2018-01-07 NOTE — Progress Notes (Signed)
Paged MD regarding discharge order and need for home oxygen tank delivery prior to discharge. Spoke with on call case manager regarding O2 delivery who is inquiring about possible delivery tonight. Expressed to MD need to rescind discharge order if unable to discharge tonight. Will continue to follow up.  Kinnie Scales, RN 01/07/18 5:30PM

## 2018-01-07 NOTE — Progress Notes (Signed)
Family Medicine Teaching Service Daily Progress Note Intern Pager: [phone]  Patient name: Renee Rice Medical record number: [id number] Date of birth: 28-Aug-1955 Age: 62 y.o. Gender: female  Primary Care Provider: System, Pcp Not In Consultants: none Code Status: full  Pt Overview and Major Events to Date:  12/21 admitted  Assessment and Plan: Renee Rice is a 62 y.o. female presenting with progressive shortness of breath and cough, found to have a left lower lobe pneumonia on CXR.  PMH is significant for chronic hepatitis C with hepatocellular carcinoma and ascites, tobacco use, thrombocytopenia, hypoalbuminemia. Paracentesis 12/20.  CAP/LL Pneumonia: Subacute- improving Clinically, patient looks much improved and feels much improved today. She states there is no more sputum and frequency has decreased with coughing. She did not sleep well last night. She only received one day of antibiotics so will be restarted today on azithromycin and omnicef (12/23-12/30). On ambulation yesterday afternoon, patient desatted to 89% and was put on 1L oxygen. On ambulation again this morning, patient desatted to 84% and was put on 4L oxygen. Ordered chest CTA. Wells score for PE of 3, moderate risk. Echo yesterday commented on L pleural effusion.  - tessalon pearls - incentive spirometer - CT angiogram of chest - order for oxygen <90% O2 sats - ambulate and oxygen monitoring again in am - Monitor CBC, CMP - Vitals per routine  Hepatocellular carcinoma, in setting of Chronic Hepatitis C/Liver Cirrhosis: Chronic, decompensating. - stable No generalized edema. No excessive ascites. Last paracentesis 12/20. Follows with Dr. Loletha Carrow with [facility]. - Continue Lasix 40 IV mg and spironolactone 100 mg daily - Monitor fluid status - Ensure follow-up with oncology and GI - Monitor CMP am  Elevated creatinine: resolved. -Monitor CMP am -Continue home diuresis  Hyponatremia: Subacute. stable Na  stable at 131 today. -Continue home diuresis -Monitor CMP and symptoms of hyponatremia  Asymptomatic bacteriuria:  U/A hazy, positive nitrites, and bacteria present. Patient denies any dysuria, frequency, and urgency. Treatment covered with PNA antibiotics. Urine culture had insignificant growth. -Monitor for symptoms - monitor I/Os  FEN/GI: Heart healthy diet Prophylaxis: SCDs  Disposition: stay in med surg due to continued and worsening hypoxia with unknown cause. Ordered CT angiogram today.  Subjective:  Patient appears to be much improved today. She states that she slept better last night with better pillow support. She has decreased coughing frequency and severity. No more sputum production. Patient has been able to tolerate more of a diet and more ambulation. She denies and SOB, DOE, lightheadedness with ambulation. Her O2 sat at rest this morning was 94%. She dessatted to 84% with ambulation.  Objective: Temp:  [98 F (36.7 C)-99.5 F (37.5 C)] 98 F (36.7 C) (12/23 0822) Pulse Rate:  [97-108] 97 (12/23 0822) Resp:  [16-18] 18 (12/22 2245) BP: (100-129)/(56-74) 129/74 (12/23 0822) SpO2:  [90 %-93 %] 93 % (12/23 0539) Physical Exam: General: alert, oriented, pleasant, well-appearing Cardiovascular: RRR, no murmur appreciated Respiratory: no wheezing, rhonchi present. Deep inhalation did not cause coughing Abdomen: softly distended, non-tender, positive BS diffusely Extremities: non edematous, moving all equally and appropriately  Laboratory: Recent Labs  Lab 01/05/18 1427 01/06/18 0223  WBC 8.0 7.2  HGB 12.8 12.3  HCT 38.4 34.8*  PLT 76* 76*   Recent Labs  Lab 01/05/18 1427 01/06/18 0223  NA 131* 131*  K 4.4 4.3  CL 97* 99  CO2 24 22  BUN 17 15  CREATININE 1.07* 0.85  CALCIUM 8.6* 8.3*  PROT 6.4* 5.8*  BILITOT  3.8* 3.2*  ALKPHOS 160* 175*  ALT 41 35  AST 83* 74*  GLUCOSE 125* 105*    Imaging/Diagnostic Tests: No results found. 12/22 Echo: normal  LV size eEF 65-70% with normal wall motion, G1DD, left pleural effusion.    Richarda Osmond, DO 01/07/2018, 9:30 AM PGY-1, [place] Intern pager: [phone], text pages welcome

## 2018-01-07 NOTE — Discharge Instructions (Signed)
Community-Acquired Pneumonia, Adult  Pneumonia is an infection of the lungs. It causes swelling in the airways of the lungs. Mucus and fluid may also build up inside the airways.  One type of pneumonia can happen while a person is in a hospital. A different type can happen when a person is not in a hospital (community-acquired pneumonia).   What are the causes?    This condition is caused by germs (viruses, bacteria, or fungi). Some types of germs can be passed from one person to another. This can happen when you breathe in droplets from the cough or sneeze of an infected person.  What increases the risk?  You are more likely to develop this condition if you:   Have a long-term (chronic) disease, such as:  ? Chronic obstructive pulmonary disease (COPD).  ? Asthma.  ? Cystic fibrosis.  ? Congestive heart failure.  ? Diabetes.  ? Kidney disease.   Have HIV.   Have sickle cell disease.   Have had your spleen removed.   Do not take good care of your teeth and mouth (poor dental hygiene).   Have a medical condition that increases the risk of breathing in droplets from your own mouth and nose.   Have a weakened body defense system (immune system).   Are a smoker.   Travel to areas where the germs that cause this illness are common.   Are around certain animals or the places they live.  What are the signs or symptoms?   A dry cough.   A wet (productive) cough.   Fever.   Sweating.   Chest pain. This often happens when breathing deeply or coughing.   Fast breathing or trouble breathing.   Shortness of breath.   Shaking chills.   Feeling tired (fatigue).   Muscle aches.  How is this treated?  Treatment for this condition depends on many things. Most adults can be treated at home. In some cases, treatment must happen in a hospital. Treatment may include:   Medicines given by mouth or through an IV tube.   Being given extra oxygen.   Respiratory therapy.  In rare cases, treatment for very bad pneumonia  may include:   Using a machine to help you breathe.   Having a procedure to remove fluid from around your lungs.  Follow these instructions at home:  Medicines   Take over-the-counter and prescription medicines only as told by your doctor.  ? Only take cough medicine if you are losing sleep.   If you were prescribed an antibiotic medicine, take it as told by your doctor. Do not stop taking the antibiotic even if you start to feel better.  General instructions     Sleep with your head and neck raised (elevated). You can do this by sleeping in a recliner or by putting a few pillows under your head.   Rest as needed. Get at least 8 hours of sleep each night.   Drink enough water to keep your pee (urine) pale yellow.   Eat a healthy diet that includes plenty of vegetables, fruits, whole grains, low-fat dairy products, and lean protein.   Do not use any products that contain nicotine or tobacco. These include cigarettes, e-cigarettes, and chewing tobacco. If you need help quitting, ask your doctor.   Keep all follow-up visits as told by your doctor. This is important.  How is this prevented?  A shot (vaccine) can help prevent pneumonia. Shots are often suggested for:   People   older than 61 years of age.   People older than 61 years of age who:  ? Are having cancer treatment.  ? Have long-term (chronic) lung disease.  ? Have problems with their body's defense system.  You may also prevent pneumonia if you take these actions:   Get the flu (influenza) shot every year.   Go to the dentist as often as told.   Wash your hands often. If you cannot use soap and water, use hand sanitizer.  Contact a doctor if:   You have a fever.   You lose sleep because your cough medicine does not help.  Get help right away if:   You are short of breath and it gets worse.   You have more chest pain.   Your sickness gets worse. This is very serious if:  ? You are an older adult.  ? Your body's defense system is weak.   You  cough up blood.  Summary   Pneumonia is an infection of the lungs.   Most adults can be treated at home. Some will need treatment in a hospital.   Drink enough water to keep your pee pale yellow.   Get at least 8 hours of sleep each night.  This information is not intended to replace advice given to you by your health care provider. Make sure you discuss any questions you have with your health care provider.  Document Released: 06/21/2007 Document Revised: 08/30/2017 Document Reviewed: 08/30/2017  Elsevier Interactive Patient Education  2019 [employer]

## 2018-01-07 NOTE — Care Management (Signed)
ED CM received call from [facility] from [facility] concerning home oxygen arrangement for discharge this evening. CM explained that DME suppliers may be limited  because after 5pm. CM will contact [facility]

## 2018-01-10 ENCOUNTER — Telehealth: Payer: Self-pay | Admitting: Gastroenterology

## 2018-01-10 LAB — CULTURE, BLOOD (ROUTINE X 2)
Culture: NO GROWTH
Culture: NO GROWTH
Special Requests: ADEQUATE
Special Requests: ADEQUATE

## 2018-01-10 NOTE — Telephone Encounter (Signed)
Yes, ok with getting it done sooner. May need to consider placing a peritoneal drain by IR for palliation of symptoms from Malignant ascites if she is needing frequent paracentesis.

## 2018-01-10 NOTE — Telephone Encounter (Signed)
Danis pt calling, has liver ca and has been getting IR para's done. Pt is scheduled for one on 01/14/18 but is calling stating she is very uncomfortable and is requesting it be done sooner. Please advise if ok to move para sooner as DOD.

## 2018-01-10 NOTE — Progress Notes (Signed)
[place]   [phone] Fax:(336) [phone]   Clinic Follow up Note   Patient Care Team: System, Pcp Not In as PCP - General  Date of Service:  01/11/2018  CHIEF COMPLAINT: F/u for [facility]  SUMMARY OF ONCOLOGIC HISTORY: Oncology History   Cancer Staging Hepatocellular carcinoma [facility]) Staging form: Liver, AJCC 8th Edition - Clinical stage from 08/10/2017: Stage II (cT2, cN0, cM0) - Signed by Truitt Merle, MD on 01/13/2018       Hepatocellular carcinoma [facility])    Initial Diagnosis    Hepatocellular carcinoma ([place])    07/14/2017 Imaging    CT Liver Abdomen 07/14/17  IMPRESSION: 1. There is a 1.9 cm hyperenhancing lesion within the posterior right hepatic lobe concerning for the possibility of hepatocellular carcinoma in the setting of hepatitis C and cirrhosis. There are additional smaller foci of arterial enhancement which may represent perfusion anomalies. Recommend further evaluation of the liver with pre and post contrast-enhanced MRI. 2. Sequelae of portal venous hypertension including splenomegaly and upper abdominal collateral vessels. 3. Cholelithiasis. 4. Age-indeterminate compression deformity of the L5 vertebral body and superior endplate L1 vertebral body. Recommend correlation for point tenderness. 5. These results will be called to the ordering clinician or representative by the Radiologist Assistant, and communication documented in the PACS or zVision Dashboard.    08/10/2017 Imaging    MRI Liver 08/10/17  IMPRESSION: Hepatic cirrhosis and findings of portal venous hypertension. 1.8 cm hypervascular mass in segment 7 and 1.0 cm hypervascular mass in segment 2, both of which are diagnostic of hepatocellular carcinoma. At least 3 other less than 1 cm hypervascular lesions are too small to characterize, but suspicious for additional hepatocellular carcinomas No evidence of abdominal metastatic disease.    08/10/2017 Cancer Staging   Staging form: Liver, AJCC 8th Edition - Clinical stage from 08/10/2017: Stage II (cT2, cN0, cM0) - Signed by Truitt Merle, MD on 01/13/2018    08/31/2017 Imaging    CT Chest 08/31/17 IMPRESSION: 1. No acute findings, nodule, or adenopathy. 2. Pulmonary emphysema 3. Cholelithiasis 4. Nodular liver contour suggesting cirrhosis, with splenomegaly    11/02/2017 Procedure    Y90 with Dr. Pascal Lux 11/02/17 to right lobe of liver.     12/09/2017 Imaging    CT AP 12/09/17  IMPRESSION: 1. Cirrhosis, mild splenomegaly and moderate to large amount of ascites. 2. Mild bibasilar atelectasis 3. No other acute abnormalities 4.  Aortic Atherosclerosis (ICD10-I70.0).      CURRENT THERAPY:  Observation   INTERVAL HISTORY:  Renee Rice is here for a follow up of [place]. She presents to the clinic today accompanied by husband, mother and sister. She notes fluid collection in abdomen and legs. She has had paracentesis every few days. She has only seen Dr. Loletha Carrow for this. She is on Lasix and Spironolactone.  She notes after Y90 treatment she felt sick and took her time to recover. Afterward she developed recurrent fluid collection and started having repeated paracentesis since 11/30/17. She notes having epistaxis from her congestion and dry sinuses.  She notes her current insurance policy does not cover anything associated with cirrhosis and cancer. Her new insurance will cover it but Dr. Loletha Carrow is not in network so she needs a referral to another GI for who is in network.  She notes having constipation and she called for lactulose but was told they do not make it anymore. Her daughter notes she has been more disoriented.  She is emotional about her declining and is  worried this is related to cancer progression. She notes her anxiety is not being managed by anyone.    REVIEW OF SYSTEMS:   Constitutional: Denies fevers, chills or abnormal weight loss Eyes: Denies blurriness of vision Ears, nose, mouth,  throat, and face: Denies mucositis or sore throat (+) occasional Epistaxis Respiratory: Denies dyspnea or wheezes (+) cough and congestion  Cardiovascular: Denies palpitation, chest discomfort or lower extremity swelling Gastrointestinal:  Denies nausea, heartburn or change in bowel habits (+) abdominal bloating  (+) Constipation  Skin: Denies abnormal skin rashes Lymphatics: Denies new lymphadenopathy or easy bruising Neurological:Denies numbness, tingling or new weaknesses Behavioral/Psych: Mood is stable, no new changes  (+) emotional and anxiety All other systems were reviewed with the patient and are negative.  MEDICAL HISTORY:  Past Medical History:  Diagnosis Date  . Allergic rhinitis   . Chronic viral hepatitis C ([place]) 04/27/2017   with cirrhosis  . Depression    in the past, no meds, no previous hospitalization   . Heart murmur     SURGICAL HISTORY: Past Surgical History:  Procedure Laterality Date  . IR ANGIOGRAM SELECTIVE EACH ADDITIONAL VESSEL  10/19/2017  . IR ANGIOGRAM SELECTIVE EACH ADDITIONAL VESSEL  10/19/2017  . IR ANGIOGRAM SELECTIVE EACH ADDITIONAL VESSEL  10/19/2017  . IR ANGIOGRAM SELECTIVE EACH ADDITIONAL VESSEL  10/19/2017  . IR ANGIOGRAM SELECTIVE EACH ADDITIONAL VESSEL  11/02/2017  . IR ANGIOGRAM SELECTIVE EACH ADDITIONAL VESSEL  11/02/2017  . IR ANGIOGRAM SELECTIVE EACH ADDITIONAL VESSEL  11/02/2017  . IR ANGIOGRAM VISCERAL SELECTIVE  10/19/2017  . IR ANGIOGRAM VISCERAL SELECTIVE  10/19/2017  . IR ANGIOGRAM VISCERAL SELECTIVE  11/02/2017  . IR EMBO ARTERIAL NOT HEMORR HEMANG INC GUIDE ROADMAPPING  10/19/2017  . IR EMBO TUMOR ORGAN ISCHEMIA INFARCT INC GUIDE ROADMAPPING  11/02/2017  . IR PARACENTESIS  12/04/2017  . IR PARACENTESIS  12/07/2017  . IR PARACENTESIS  12/10/2017  . IR PARACENTESIS  12/18/2017  . IR PARACENTESIS  12/31/2017  . IR PARACENTESIS  01/04/2018  . IR RADIOLOGIST EVAL & MGMT  09/18/2017  . IR RADIOLOGIST EVAL & MGMT  11/20/2017  . IR US  GUIDE VASC ACCESS RIGHT  10/19/2017  . IR US GUIDE VASC ACCESS RIGHT  11/02/2017    I have reviewed the social history and family history with the patient and they are unchanged from previous note.  ALLERGIES:  has No Known Allergies.  MEDICATIONS:  Current Outpatient Medications  Medication Sig Dispense Refill  . acetaminophen (TYLENOL) 500 MG tablet Take 1,000 mg by mouth every 6 (six) hours as needed for mild pain.     Marland Kitchen azithromycin (ZITHROMAX) 250 MG tablet Take 1 tablet (250 mg total) by mouth daily. 6 each 0  . benzonatate (TESSALON) 100 MG capsule Take 1 capsule (100 mg total) by mouth 3 (three) times daily as needed for up to 5 days for cough. 15 capsule 0  . calcium carbonate (TUMS - DOSED IN MG ELEMENTAL CALCIUM) 500 MG chewable tablet Chew 1 tablet by mouth daily as needed for heartburn.     . cefdinir (OMNICEF) 300 MG capsule Take 1 capsule (300 mg total) by mouth every 12 (twelve) hours for 7 days. (twice per day) 14 capsule 0  . furosemide (LASIX) 40 MG tablet Take 1 tablet (40 mg total) by mouth daily. 30 tablet 2  . spironolactone (ALDACTONE) 100 MG tablet Take 1 tablet (100 mg total) by mouth daily. 30 tablet 2  . ALPRAZolam (XANAX) 0.25 MG tablet Take  1 tablet (0.25 mg total) by mouth at bedtime as needed for anxiety. 30 tablet 0  . guaiFENesin-codeine 100-10 MG/5ML syrup Take 10 mLs by mouth every 6 (six) hours as needed for cough. 180 mL 0  . lactulose (CEPHULAC) 10 g packet Take 1 packet (10 g total) by mouth 2 (two) times daily. (Patient not taking: Reported on 01/05/2018) 60 each 3  . sertraline (ZOLOFT) 50 MG tablet Take 1 tablet (50 mg total) by mouth daily. 30 tablet 1   No current facility-administered medications for this visit.    Facility-Administered Medications Ordered in Other Visits  Medication Dose Route Frequency Provider Last Rate Last Dose  . lidocaine (XYLOCAINE) 1 % (with pres) injection             PHYSICAL EXAMINATION: ECOG PERFORMANCE STATUS:  2 - Symptomatic, <50% confined to bed  Vitals:   01/11/18 1156  BP: 125/68  Pulse: 99  Resp: 18  Temp: (!) 97.3 F (36.3 C)  SpO2: 92%   Filed Weights   01/11/18 1156  Weight: 209 lb 14.4 oz (95.2 kg)    GENERAL:alert, no distress and comfortable SKIN: skin color, texture, turgor are normal, no rashes or significant lesions EYES: normal, Conjunctiva are pink and non-injected, sclera clear OROPHARYNX:no exudate, no erythema and lips, buccal mucosa, and tongue normal  NECK: supple, thyroid normal size, non-tender, without nodularity LYMPH:  no palpable lymphadenopathy in the cervical, axillary or inguinal LUNGS: clear to auscultation and percussion with normal breathing effort HEART: regular rate & rhythm and no murmurs (+) She has b/l pitting edema ABDOMEN:abdomen soft, non-tender and normal bowel sounds (+) moderate abdominal bloating  Musculoskeletal:no cyanosis of digits and no clubbing  NEURO: alert & oriented x 3 with fluent speech, no focal motor/sensory deficits  LABORATORY DATA:  I have reviewed the data as listed CBC Latest Ref Rng & Units 01/06/2018 01/05/2018 12/10/2017  WBC 4.0 - 10.5 K/uL 7.2 8.0 3.4(L)  Hemoglobin 12.0 - 15.0 g/dL 12.3 12.8 12.0  Hematocrit 36.0 - 46.0 % 34.8(L) 38.4 36.1  Platelets 150 - 400 K/uL 76(L) 76(L) 57(L)     CMP Latest Ref Rng & Units 01/06/2018 01/05/2018 12/27/2017  Glucose 70 - 99 mg/dL 105(H) 125(H) 103(H)  BUN 8 - 23 mg/dL 15 17 16   Creatinine 0.44 - 1.00 mg/dL 0.85 1.07(H) 0.78  Sodium 135 - 145 mmol/L 131(L) 131(L) 133(L)  Potassium 3.5 - 5.1 mmol/L 4.3 4.4 4.4  Chloride 98 - 111 mmol/L 99 97(L) 103  CO2 22 - 32 mmol/L 22 24 25   Calcium 8.9 - 10.3 mg/dL 8.3(L) 8.6(L) 8.2(L)  Total Protein 6.5 - 8.1 g/dL 5.8(L) 6.4(L) 6.5  Total Bilirubin 0.3 - 1.2 mg/dL 3.2(H) 3.8(H) 2.8(H)  Alkaline Phos 38 - 126 U/L 175(H) 160(H) 206(H)  AST 15 - 41 U/L 74(H) 83(H) 65(H)  ALT 0 - 44 U/L 35 41 33      RADIOGRAPHIC STUDIES: I  have personally reviewed the radiological images as listed and agreed with the findings in the report. Ir Abdomen US Limited  Result Date: 01/11/2018 CLINICAL DATA:  62 year old with history of hepatitis-C, cirrhosis and hepatocellular carcinoma. Patient presented with abdominal distension and evaluation for a paracentesis. EXAM: LIMITED ABDOMEN ULTRASOUND FOR ASCITES TECHNIQUE: Limited ultrasound survey for ascites was performed in all four abdominal quadrants. COMPARISON:  12/31/2017 FINDINGS: Small amount of ascites identified in the abdomen. Not enough fluid for a therapeutic paracentesis. IMPRESSION: Small amount of ascites.  Paracentesis not performed. Electronically Signed  By: Markus Daft M.D.   On: 01/11/2018 17:04     ASSESSMENT & PLAN:  Barry Culverhouse is a 62 y.o. female with   1. Hepatocellular carcinoma, mutifocal, cT2N0M0, stage II -She was diagnosed in 07/2017.  Her initial MRI showed a 1.8 cm mass in segment 8, and a 1 cm mass in segment 2, both were diagnostic of HCC, and she also has 3 subcentimeter hypervascular lesion which were indeterminate.   -She is s/p Y90 treatment to right lobe lesion with Dr. Pascal Lux. She tolerated moderately well. 1 months after Y90 she developed recurrent abdominal ascites and has needed repeated paracentesis since 11/30/17.  -I discussed this can be related worsened liver function secondary to Y90 or cancer progression. Will rescan with MRI to rule out disease progression.   -she still has untreated left lobe liver cancer, but I do not think she can tolerate another radioembolization due to poor liver tolerance (her tbil has been above 3 after last Y90).  -We discussed systemic therapy options, which includes but not limited to, sorafenib, lenvatinib, immunotherapy with nivolumab or Keytruda, other TKIs, etc. due to her decompensated liver function, she is not a good candidate for sorafenib or lenvatinib.  Discussed the recent early phase clinical trial  data of Tecentriq and Avastin in [facility], which showed great response rate.  -She will no longer be able to follow up with Dr. Loletha Carrow under her new insurance. I will refer her to Dr. Collene Mares and NP [facility] for management of her liver cirrhosis and complications if they are in her network.  -I discussed her eligibility for liver transplant.  She may not be eligible if she has more than 3 cancer lesions in the liver.  We will see what the next MRI shows. -F/u in 3 weeks, after restaging scan   2. Treatment naive Hep C -She was planned to have treatment with 12 week course of Epclusa per Janene Madeira, NP in [place]; treatment was delayed and ultimately on hold due to her diagnosis of [place].   3. Liver cirrhosis with portal venous hypertension, Recurrent Ascites -Followed by Dr. Loletha Carrow, she will ultimately need EGD for variceal screening  -She has developed constipation from her liver cirrhosis. I encouraged her to take Miralax daily and increase water intake.  -I instructed her to take more smaller amount of protein in diet and lower salt intake to help ascites.  -She has had recurrent ascites since Y90 from worsened liver function. She has required repeated Paracentesis every few days  -She is on Lasix and spironolactone.  4. Social issues, Anxiety/Depression   -Her new insurance will start on 01/16/18 and will give her full coverage.  -I will refer her to social worker Webb Silversmith to evaluate her and may refer her to Psychiatrist -She has notes being suicidal with no plan.  -I will prescribe her Xanax and Zoloft today (01/11/18).  5, Cough from recent Pneumonia  -She has hospitalized 01/05/18 for PNA -She still has cough which is improving.  -She has been on Rutussin. Will refill today   PLAN: -I prescribed her Xanax and Zoloft and I refilled her Rutussin today  -F/u in 3 weeks with lab and abdomen MRI in 2-3 weeks  -I will communicate her care with Drs Thornton Papas, and NP [facility]  -refer  to SW for depression counseling   No problem-specific Assessment & Plan notes found for this encounter.   No orders of the defined types were placed in this encounter.  All questions were answered. The patient knows to call the clinic with any problems, questions or concerns. No barriers to learning was detected.  I spent 30 minutes counseling the patient face to face. The total time spent in the appointment was 45 minutes and more than 50% was on counseling and review of test results     Truitt Merle, MD 01/11/2018   I, Joslyn Devon, am acting as scribe for Truitt Merle, MD.   I have reviewed the above documentation for accuracy and completeness, and I agree with the above.

## 2018-01-10 NOTE — Telephone Encounter (Signed)
Pts para appt moved to 01/11/18@2pm , pt to arrive there at 1:45pm. Pt aware of appt.

## 2018-01-10 NOTE — Telephone Encounter (Signed)
Pt called in wanting to speak with the nurse to get a order for radiology she advised that she is in great pain.

## 2018-01-11 ENCOUNTER — Telehealth: Payer: Self-pay | Admitting: Hematology

## 2018-01-11 ENCOUNTER — Inpatient Hospital Stay (HOSPITAL_BASED_OUTPATIENT_CLINIC_OR_DEPARTMENT_OTHER): Payer: Managed Care, Other (non HMO) | Admitting: Hematology

## 2018-01-11 ENCOUNTER — Other Ambulatory Visit: Payer: Self-pay | Admitting: Hematology

## 2018-01-11 ENCOUNTER — Ambulatory Visit (HOSPITAL_COMMUNITY)
Admission: RE | Admit: 2018-01-11 | Discharge: 2018-01-11 | Disposition: A | Discharge status: Home / Self Care | Payer: Managed Care, Other (non HMO) | Source: Ambulatory Visit | Attending: Gastroenterology | Admitting: Gastroenterology

## 2018-01-11 ENCOUNTER — Inpatient Hospital Stay: Payer: Managed Care, Other (non HMO) | Attending: Hematology

## 2018-01-11 ENCOUNTER — Other Ambulatory Visit: Payer: Self-pay | Admitting: Gastroenterology

## 2018-01-11 VITALS — BP 125/68 | HR 99 | Temp 97.3°F | Resp 18 | Ht 71.0 in | Wt 209.9 lb

## 2018-01-11 DIAGNOSIS — K7460 Unspecified cirrhosis of liver: Secondary | ICD-10-CM | POA: Insufficient documentation

## 2018-01-11 DIAGNOSIS — R188 Other ascites: Secondary | ICD-10-CM

## 2018-01-11 DIAGNOSIS — C220 Liver cell carcinoma: Secondary | ICD-10-CM | POA: Insufficient documentation

## 2018-01-11 DIAGNOSIS — K7469 Other cirrhosis of liver: Secondary | ICD-10-CM | POA: Diagnosis not present

## 2018-01-11 DIAGNOSIS — K5900 Constipation, unspecified: Secondary | ICD-10-CM

## 2018-01-11 DIAGNOSIS — R410 Disorientation, unspecified: Secondary | ICD-10-CM

## 2018-01-11 DIAGNOSIS — F418 Other specified anxiety disorders: Secondary | ICD-10-CM | POA: Insufficient documentation

## 2018-01-11 DIAGNOSIS — B182 Chronic viral hepatitis C: Secondary | ICD-10-CM

## 2018-01-11 DIAGNOSIS — R140 Abdominal distension (gaseous): Secondary | ICD-10-CM | POA: Insufficient documentation

## 2018-01-11 DIAGNOSIS — B1920 Unspecified viral hepatitis C without hepatic coma: Secondary | ICD-10-CM | POA: Insufficient documentation

## 2018-01-11 DIAGNOSIS — R05 Cough: Secondary | ICD-10-CM

## 2018-01-11 MED ORDER — LIDOCAINE HCL 1 % IJ SOLN
INTRAMUSCULAR | Status: AC
  Filled 2018-01-11: qty 20

## 2018-01-11 MED ORDER — GUAIFENESIN-CODEINE 100-10 MG/5ML PO SOLN: 10.0000 mL | Freq: Four times a day (QID) | ORAL | 0 refills | Status: DC | PRN

## 2018-01-11 MED ORDER — ALPRAZOLAM 0.25 MG PO TABS: 0.2500 mg | ORAL_TABLET | Freq: Every evening | ORAL | 0 refills | Status: DC | PRN

## 2018-01-11 MED ORDER — SERTRALINE HCL 50 MG PO TABS: 50.0000 mg | ORAL_TABLET | Freq: Every day | ORAL | 1 refills | Status: AC

## 2018-01-11 NOTE — Progress Notes (Signed)
Patient presented for therapeutic paracentesis today. She states she has worsening abdominal distention and is unable to lay flat for very long because of it. She also reports recent constipation with one very small BM today however none for about a week or so - she has been started on Miralax for constipation although has not taken first dose.  Limited abdominal ultrasound shows very small amount of fluid not amenable to percutaneous drainage. No procedure performed today.  Please call IR with questions or concerns.  Candiss Norse, PA-C 01/11/2018

## 2018-01-11 NOTE — Telephone Encounter (Signed)
Printed calendar and avs. °

## 2018-01-11 NOTE — Telephone Encounter (Signed)
Returned call to pt.  Unsure why she is requesting to speak to me.  Received call approx one week ago that [facility] wants pt to schedule appt with [place].  Referred to our scheduling team to obtain appt for pt.

## 2018-01-12 LAB — AFP TUMOR MARKER: AFP, Serum, Tumor Marker: 10.2 ng/mL — ABNORMAL HIGH (ref 0.0–8.3)

## 2018-01-13 ENCOUNTER — Encounter: Payer: Self-pay | Admitting: Hematology

## 2018-01-14 ENCOUNTER — Other Ambulatory Visit: Payer: Self-pay

## 2018-01-14 ENCOUNTER — Telehealth: Payer: Self-pay | Admitting: Gastroenterology

## 2018-01-14 ENCOUNTER — Encounter: Payer: Self-pay | Admitting: Hematology

## 2018-01-14 ENCOUNTER — Other Ambulatory Visit (HOSPITAL_COMMUNITY): Payer: Managed Care, Other (non HMO)

## 2018-01-14 ENCOUNTER — Encounter: Payer: Self-pay | Admitting: *Deleted

## 2018-01-14 DIAGNOSIS — R188 Other ascites: Principal | ICD-10-CM

## 2018-01-14 DIAGNOSIS — K7460 Unspecified cirrhosis of liver: Secondary | ICD-10-CM

## 2018-01-14 NOTE — Telephone Encounter (Signed)
Left message for pt to call back. Per OV note from Dr. Burr Medico it states her insurance has changed and Dr. Loletha Carrow is now out of network, Dr. Burr Medico referred her to Dr. Collene Mares.

## 2018-01-14 NOTE — Progress Notes (Signed)
[place] Work  Holiday representative received referral from medical oncology for counseling and emotional support.  CSW contacted patient at home to offer support and assess for needs.  CSW left patient a message offering support and information on support services at [facility].  CSW encouraged patient to call back to further discuss resources and counseling options.    Johnnye Lana, MSW, LCSW, OSW-C Clinical Social Worker [facility] [phone]

## 2018-01-15 ENCOUNTER — Other Ambulatory Visit: Payer: Self-pay

## 2018-01-15 ENCOUNTER — Telehealth: Payer: Self-pay

## 2018-01-15 NOTE — Telephone Encounter (Signed)
Mailed letter drafted by Dr. Burr Medico to patient's home address.

## 2018-01-15 NOTE — Progress Notes (Signed)
Chart review.

## 2018-01-15 NOTE — Telephone Encounter (Signed)
Pt had not picked up the lactulose there was some confusion with the pharmacy. Pharmacy has found it now under the name [facility] and they have ordered it for her should be there on Thursday. Pt aware. Pt states she has FMLA paperwork that needs to be filled out and wants to know if she should bring it to Dr. Loletha Carrow or to Dr. Burr Medico. Please advise.

## 2018-01-15 NOTE — Telephone Encounter (Signed)
Spoke with pt and she states she found out yesterday that Dr. Loletha Carrow will be in network with her insurance and she wants to continue care her with him. Dr. Burr Medico and Dr. Loletha Carrow aware.

## 2018-01-15 NOTE — Telephone Encounter (Signed)
I am glad to hear that she can continue to see Dr. Loletha Carrow.   Malachy Mood, please call pt's daughter to confirm that Dr. Loletha Carrow and I are in the network of her new insurance in 2020. Also let them know that I will be happy to fill out her FMLA paper work. Thanks   Truitt Merle MD

## 2018-01-15 NOTE — Telephone Encounter (Signed)
Understood - that is good news. Please be sure she has been able to get the lactulose.  Should be taking a tablespoon twice daily.  Might readjust diuretics when I get next BMP result.  If no fluid to tap on next ultrasound. Will discontinue the standing order for every other week tap.

## 2018-01-17 ENCOUNTER — Other Ambulatory Visit: Payer: Self-pay

## 2018-01-17 ENCOUNTER — Telehealth: Payer: Self-pay

## 2018-01-17 NOTE — Telephone Encounter (Signed)
Faxed referral to [facility] NP [place] at [phone] which included demographics and Dr. Ernestina Penna office note from 01/11/18

## 2018-01-17 NOTE — Telephone Encounter (Signed)
Patient states will stay with Dr. Loletha Carrow as she found out he is in network, also has not heard from the [place], informed patient I will follow up on that referral.

## 2018-01-17 NOTE — Progress Notes (Unsigned)
     Re 

## 2018-01-18 ENCOUNTER — Telehealth: Payer: Self-pay | Admitting: *Deleted

## 2018-01-18 ENCOUNTER — Telehealth: Payer: Self-pay

## 2018-01-18 NOTE — Telephone Encounter (Signed)
Received fax from [facility] office [facility] they have scheduled the patient an appointment for 02/25/2018 at 1:30 pm.

## 2018-01-18 NOTE — Telephone Encounter (Signed)
Medical records faxed to [facility]; RID [id number]

## 2018-01-18 NOTE — Telephone Encounter (Deleted)
Medical records faxed to [facility]; RID [id number]

## 2018-01-23 ENCOUNTER — Telehealth: Payer: Self-pay | Admitting: Hematology

## 2018-01-24 ENCOUNTER — Ambulatory Visit (HOSPITAL_COMMUNITY)
Admission: RE | Admit: 2018-01-24 | Discharge: 2018-01-24 | Disposition: A | Discharge status: Home / Self Care | Payer: BLUE CROSS/BLUE SHIELD | Source: Ambulatory Visit | Attending: Hematology | Admitting: Hematology

## 2018-01-24 ENCOUNTER — Ambulatory Visit (HOSPITAL_COMMUNITY): Admission: RE | Admit: 2018-01-24 | Payer: BLUE CROSS/BLUE SHIELD | Source: Ambulatory Visit

## 2018-01-24 DIAGNOSIS — C220 Liver cell carcinoma: Secondary | ICD-10-CM | POA: Insufficient documentation

## 2018-01-24 DIAGNOSIS — K8020 Calculus of gallbladder without cholecystitis without obstruction: Secondary | ICD-10-CM | POA: Diagnosis not present

## 2018-01-24 MED ORDER — GADOBUTROL 1 MMOL/ML IV SOLN
9.0000 mL | Freq: Once | INTRAVENOUS | Status: AC | PRN
  Administered 2018-01-24: 9 mL via INTRAVENOUS

## 2018-01-28 ENCOUNTER — Other Ambulatory Visit: Payer: Managed Care, Other (non HMO)

## 2018-01-28 ENCOUNTER — Ambulatory Visit: Payer: Managed Care, Other (non HMO) | Admitting: Hematology

## 2018-01-29 ENCOUNTER — Inpatient Hospital Stay (HOSPITAL_COMMUNITY): Admission: RE | Admit: 2018-01-29 | Payer: Managed Care, Other (non HMO) | Source: Ambulatory Visit

## 2018-01-29 ENCOUNTER — Other Ambulatory Visit: Payer: Self-pay | Admitting: Interventional Radiology

## 2018-01-29 DIAGNOSIS — C220 Liver cell carcinoma: Secondary | ICD-10-CM

## 2018-01-30 NOTE — Progress Notes (Signed)
[place]   [phone] [phone]   Clinic Follow up Note   Patient Care Team: System, Pcp Not In as PCP - General  Date of Service:  02/01/2018  CHIEF COMPLAINT: f/u Cable   SUMMARY OF ONCOLOGIC HISTORY: Oncology History   Cancer Staging Hepatocellular carcinoma [facility]) Staging form: Liver, AJCC 8th Edition - Clinical stage from 08/10/2017: Stage II (cT2, cN0, cM0) - Signed by Truitt Merle, MD on 01/13/2018       Hepatocellular carcinoma [facility])    Initial Diagnosis    Hepatocellular carcinoma ([place])    07/14/2017 Imaging    CT Liver Abdomen 07/14/17  IMPRESSION: 1. There is a 1.9 cm hyperenhancing lesion within the posterior right hepatic lobe concerning for the possibility of hepatocellular carcinoma in the setting of hepatitis C and cirrhosis. There are additional smaller foci of arterial enhancement which may represent perfusion anomalies. Recommend further evaluation of the liver with pre and post contrast-enhanced MRI. 2. Sequelae of portal venous hypertension including splenomegaly and upper abdominal collateral vessels. 3. Cholelithiasis. 4. Age-indeterminate compression deformity of the L5 vertebral body and superior endplate L1 vertebral body. Recommend correlation for point tenderness. 5. These results will be called to the ordering clinician or representative by the Radiologist Assistant, and communication documented in the PACS or zVision Dashboard.    08/10/2017 Imaging    MRI Liver 08/10/17  IMPRESSION: Hepatic cirrhosis and findings of portal venous hypertension. 1.8 cm hypervascular mass in segment 7 and 1.0 cm hypervascular mass in segment 2, both of which are diagnostic of hepatocellular carcinoma. At least 3 other less than 1 cm hypervascular lesions are too small to characterize, but suspicious for additional hepatocellular carcinomas No evidence of abdominal metastatic disease.    08/10/2017 Cancer Staging   Staging form: Liver, AJCC 8th Edition - Clinical stage from 08/10/2017: Stage II (cT2, cN0, cM0) - Signed by Truitt Merle, MD on 01/13/2018    08/31/2017 Imaging    CT Chest 08/31/17 IMPRESSION: 1. No acute findings, nodule, or adenopathy. 2. Pulmonary emphysema 3. Cholelithiasis 4. Nodular liver contour suggesting cirrhosis, with splenomegaly    11/02/2017 Procedure    Y90 with Dr. Pascal Lux 11/02/17 to right lobe of liver.     12/09/2017 Imaging    CT AP 12/09/17  IMPRESSION: 1. Cirrhosis, mild splenomegaly and moderate to large amount of ascites. 2. Mild bibasilar atelectasis 3. No other acute abnormalities 4.  Aortic Atherosclerosis (ICD10-I70.0).      CURRENT THERAPY: observation   INTERVAL HISTORY:  Renee Rice is here for a follow up.  She is accompanied by her husband to clinic today.  Her sister was also on the phone during her visit.  She overall feels better than 3 weeks ago when I saw her, her abdominal distention and leg swelling has much decreased, and she has lost about 20 pounds. Her last paracentesis was not enough fluids to be drained.  She is on lactulose, but her diarrhea, and has decreased dose now.  She is on Lasix and Aldactone, complains of months, she does drink fluids adequately.  Her anxiety and depression has slightly improved, she is compliant with Zoloft and tolerating well.  She noticed mild intermittent hand tremor, no impact on her hand function.   REVIEW OF SYSTEMS:   Constitutional: Denies fevers, chills or abnormal weight loss, (+) fatigue and weight loss  Eyes: Denies blurriness of vision Ears, nose, mouth, throat, and face: Denies mucositis or sore throat Respiratory: Denies cough, dyspnea or wheezes  Cardiovascular: Denies palpitation, chest discomfort or lower extremity swelling Gastrointestinal:  Denies nausea, heartburn or change in bowel habits Skin: Denies abnormal skin rashes Lymphatics: Denies new lymphadenopathy or easy  bruising Neurological:Denies numbness, tingling or new weaknesses Behavioral/Psych: Mood is stable, no new changes  All other systems were reviewed with the patient and are negative.  MEDICAL HISTORY:  Past Medical History:  Diagnosis Date  . Allergic rhinitis   . Chronic viral hepatitis C ([name]) 04/27/2017   with cirrhosis  . Depression    in the past, no meds, no previous hospitalization   . Heart murmur     SURGICAL HISTORY: Past Surgical History:  Procedure Laterality Date  . IR ANGIOGRAM SELECTIVE EACH ADDITIONAL VESSEL  10/19/2017  . IR ANGIOGRAM SELECTIVE EACH ADDITIONAL VESSEL  10/19/2017  . IR ANGIOGRAM SELECTIVE EACH ADDITIONAL VESSEL  10/19/2017  . IR ANGIOGRAM SELECTIVE EACH ADDITIONAL VESSEL  10/19/2017  . IR ANGIOGRAM SELECTIVE EACH ADDITIONAL VESSEL  11/02/2017  . IR ANGIOGRAM SELECTIVE EACH ADDITIONAL VESSEL  11/02/2017  . IR ANGIOGRAM SELECTIVE EACH ADDITIONAL VESSEL  11/02/2017  . IR ANGIOGRAM VISCERAL SELECTIVE  10/19/2017  . IR ANGIOGRAM VISCERAL SELECTIVE  10/19/2017  . IR ANGIOGRAM VISCERAL SELECTIVE  11/02/2017  . IR EMBO ARTERIAL NOT HEMORR HEMANG INC GUIDE ROADMAPPING  10/19/2017  . IR EMBO TUMOR ORGAN ISCHEMIA INFARCT INC GUIDE ROADMAPPING  11/02/2017  . IR PARACENTESIS  12/04/2017  . IR PARACENTESIS  12/07/2017  . IR PARACENTESIS  12/10/2017  . IR PARACENTESIS  12/18/2017  . IR PARACENTESIS  12/31/2017  . IR PARACENTESIS  01/04/2018  . IR RADIOLOGIST EVAL & MGMT  09/18/2017  . IR RADIOLOGIST EVAL & MGMT  11/20/2017  . IR US GUIDE VASC ACCESS RIGHT  10/19/2017  . IR US GUIDE VASC ACCESS RIGHT  11/02/2017    I have reviewed the social history and family history with the patient and they are unchanged from previous note.  ALLERGIES:  has No Known Allergies.  MEDICATIONS:  Current Outpatient Medications  Medication Sig Dispense Refill  . acetaminophen (TYLENOL) 500 MG tablet Take 1,000 mg by mouth every 6 (six) hours as needed for mild pain.     Marland Kitchen  ALPRAZolam (XANAX) 0.25 MG tablet Take 1 tablet (0.25 mg total) by mouth at bedtime as needed for anxiety. 30 tablet 0  . azithromycin (ZITHROMAX) 250 MG tablet Take 1 tablet (250 mg total) by mouth daily. 6 each 0  . calcium carbonate (TUMS - DOSED IN MG ELEMENTAL CALCIUM) 500 MG chewable tablet Chew 1 tablet by mouth daily as needed for heartburn.     . furosemide (LASIX) 40 MG tablet Take 1 tablet (40 mg total) by mouth daily. 30 tablet 2  . guaiFENesin-codeine 100-10 MG/5ML syrup Take 10 mLs by mouth every 6 (six) hours as needed for cough. 180 mL 0  . lactulose (CEPHULAC) 10 g packet Take 1 packet (10 g total) by mouth 2 (two) times daily. 60 each 3  . sertraline (ZOLOFT) 50 MG tablet Take 1 tablet (50 mg total) by mouth daily. 30 tablet 1  . spironolactone (ALDACTONE) 100 MG tablet Take 1 tablet (100 mg total) by mouth daily. 30 tablet 2   No current facility-administered medications for this visit.     PHYSICAL EXAMINATION: ECOG PERFORMANCE STATUS: 2 - Symptomatic, <50% confined to bed  Vitals:   02/01/18 1312  BP: (!) 117/99  Pulse: 89  Resp: 18  Temp: 97.7 F (36.5 C)  SpO2: 94%   Filed  Weights   02/01/18 1312  Weight: 187 lb 3.2 oz (84.9 kg)    GENERAL:alert, no distress and comfortable SKIN: skin color, texture, turgor are normal, no rashes or significant lesions EYES: normal, Conjunctiva are pink and non-injected, sclera clear OROPHARYNX:no exudate, no erythema and lips, buccal mucosa, and tongue normal  NECK: supple, thyroid normal size, non-tender, without nodularity LYMPH:  no palpable lymphadenopathy in the cervical, axillary or inguinal LUNGS: clear to auscultation and percussion with normal breathing effort HEART: regular rate & rhythm and no murmurs and no lower extremity edema ABDOMEN:abdomen soft, non-tender and normal bowel sounds, no organomegaly or ascites Musculoskeletal:no cyanosis of digits and no clubbing, trace bilateral lower extremity edema NEURO:  alert & oriented x 3 with fluent speech, no focal motor/sensory deficits  LABORATORY DATA:  I have reviewed the data as listed CBC Latest Ref Rng & Units 01/06/2018 01/05/2018 12/10/2017  WBC 4.0 - 10.5 K/uL 7.2 8.0 3.4(L)  Hemoglobin 12.0 - 15.0 g/dL 12.3 12.8 12.0  Hematocrit 36.0 - 46.0 % 34.8(L) 38.4 36.1  Platelets 150 - 400 K/uL 76(L) 76(L) 57(L)     CMP Latest Ref Rng & Units 01/06/2018 01/05/2018 12/27/2017  Glucose 70 - 99 mg/dL 105(H) 125(H) 103(H)  BUN 8 - 23 mg/dL 15 17 16   Creatinine 0.44 - 1.00 mg/dL 0.85 1.07(H) 0.78  Sodium 135 - 145 mmol/L 131(L) 131(L) 133(L)  Potassium 3.5 - 5.1 mmol/L 4.3 4.4 4.4  Chloride 98 - 111 mmol/L 99 97(L) 103  CO2 22 - 32 mmol/L 22 24 25   Calcium 8.9 - 10.3 mg/dL 8.3(L) 8.6(L) 8.2(L)  Total Protein 6.5 - 8.1 g/dL 5.8(L) 6.4(L) 6.5  Total Bilirubin 0.3 - 1.2 mg/dL 3.2(H) 3.8(H) 2.8(H)  Alkaline Phos 38 - 126 U/L 175(H) 160(H) 206(H)  AST 15 - 41 U/L 74(H) 83(H) 65(H)  ALT 0 - 44 U/L 35 41 33      RADIOGRAPHIC STUDIES: I have personally reviewed the radiological images as listed and agreed with the findings in the report. No results found.   ASSESSMENT & PLAN:  Renee Rice is a 63 y.o. female with   1. Hepatocellular carcinoma, mutifocal, cT2N0M0, stage II -She was diagnosed in 07/2017.  Her initial MRI showed a 1.8 cm mass in segment 8, and a 1 cm mass in segment 2, both were diagnostic of HCC, and she also has 3 subcentimeter hypervascular lesion which were indeterminate.   -She is s/p Y90 treatment to right lobe lesion with Dr. Pascal Lux. She tolerated moderately well. 1 months after Y90 she developed recurrent abdominal ascites and has needed repeated paracentesis since 11/30/17, secondary to decompensated liver cirrhosis. -With medical management, especially diuretics, her ascites and leg swelling have much improved, she has not needed paracentesis for more than a month now. -I reviewed her restaging MRI from January 24, 2018,  which showed a partial response to Y 90.  The lesion in segment 7 has decreased significantly, other lesions are stable. -We discussed that her cancer is unlikely curable, she is probably not of candidate for further Y 90 treatment due to previous decompensated liver function after Y 90. -We will continue observation for her liver cancer, I will likely start systemic therapy if she has further disease progression in the future. -Plan to repeat scan in 4 months  2. Treatment naive Hep C -She was planned to have treatment with 12 week course of Epclusa per Janene Madeira, NP in [place]; treatment was delayed and ultimately on hold due to her diagnosis  of [place].   3. Liver cirrhosis with portal venous hypertension, Recurrent Ascites -Followed by Dr. Loletha Carrow, she will ultimately need EGD for variceal screening -She has developed constipation from her liver cirrhosis. I encouraged her to take Miralax daily and increase water intake.  -I instructed her to take more smaller amount of protein in diet and lower salt intake to help ascites.  -She has had recurrent ascites since Y90 from worsened liver function.  This has been much improved with medical management, she is on Lasix and spironolactone. -Follow-up with Dr. Simona Huh, possible reduce her diuretics dose  4. Social issues, Anxiety/Depression   -I started her on Zoloft and Xanax, improved, will continue   PLAN: -I reviewed her restaging MRI findings, she has had good response to Y 90 -Continue observation -Lab and follow-up in 2 months, plan to repeat MRI in 4 months -she will follow up with Dr. Pascal Lux, and a Dr. Simona Huh  No problem-specific Assessment & Plan notes found for this encounter.   No orders of the defined types were placed in this encounter.  All questions were answered. The patient knows to call the clinic with any problems, questions or concerns. No barriers to learning was detected. I spent 20 minutes counseling the patient face  to face. The total time spent in the appointment was 25 minutes and more than 50% was on counseling and review of test results     Truitt Merle, MD 02/01/2018   I, Joslyn Devon, am acting as scribe for Truitt Merle, MD.   I have reviewed the above documentation for accuracy and completeness, and I agree with the above.

## 2018-02-01 ENCOUNTER — Telehealth: Payer: Self-pay | Admitting: Gastroenterology

## 2018-02-01 ENCOUNTER — Telehealth: Payer: Self-pay

## 2018-02-01 ENCOUNTER — Encounter: Payer: Self-pay | Admitting: Hematology

## 2018-02-01 ENCOUNTER — Telehealth: Payer: Self-pay | Admitting: Hematology

## 2018-02-01 ENCOUNTER — Inpatient Hospital Stay: Payer: BLUE CROSS/BLUE SHIELD | Attending: Hematology

## 2018-02-01 ENCOUNTER — Other Ambulatory Visit (INDEPENDENT_AMBULATORY_CARE_PROVIDER_SITE_OTHER): Payer: BLUE CROSS/BLUE SHIELD

## 2018-02-01 ENCOUNTER — Inpatient Hospital Stay (HOSPITAL_BASED_OUTPATIENT_CLINIC_OR_DEPARTMENT_OTHER): Payer: BLUE CROSS/BLUE SHIELD | Admitting: Hematology

## 2018-02-01 VITALS — BP 117/99 | HR 89 | Temp 97.7°F | Resp 18 | Ht 71.0 in | Wt 187.2 lb

## 2018-02-01 DIAGNOSIS — C220 Liver cell carcinoma: Secondary | ICD-10-CM

## 2018-02-01 DIAGNOSIS — K7460 Unspecified cirrhosis of liver: Secondary | ICD-10-CM | POA: Diagnosis not present

## 2018-02-01 DIAGNOSIS — F418 Other specified anxiety disorders: Secondary | ICD-10-CM

## 2018-02-01 DIAGNOSIS — Z79899 Other long term (current) drug therapy: Secondary | ICD-10-CM | POA: Diagnosis not present

## 2018-02-01 DIAGNOSIS — R188 Other ascites: Secondary | ICD-10-CM | POA: Diagnosis not present

## 2018-02-01 DIAGNOSIS — B182 Chronic viral hepatitis C: Secondary | ICD-10-CM

## 2018-02-01 DIAGNOSIS — K7469 Other cirrhosis of liver: Secondary | ICD-10-CM | POA: Diagnosis not present

## 2018-02-01 LAB — BASIC METABOLIC PANEL
BUN: 12 mg/dL (ref 6–23)
CO2: 23 mEq/L (ref 19–32)
Calcium: 8.7 mg/dL (ref 8.4–10.5)
Chloride: 102 mEq/L (ref 96–112)
Creatinine, Ser: 0.64 mg/dL (ref 0.40–1.20)
GFR: 94.31 mL/min (ref >60.00)
Glucose, Bld: 105 mg/dL — ABNORMAL HIGH (ref 70–99)
Potassium: 4.5 mEq/L (ref 3.5–5.1)
Sodium: 131 mEq/L — ABNORMAL LOW (ref 135–145)

## 2018-02-01 NOTE — Telephone Encounter (Signed)
Dropped disability papers off in [employer] to be completed.

## 2018-02-01 NOTE — Telephone Encounter (Signed)
Printed calendar and avs. °

## 2018-02-01 NOTE — Telephone Encounter (Signed)
Spoke to [employer], and spoke to [employer].Back on 01-17-2018 she picked up her lactulose, she paid $483. As it was not covered with her insurance. Instead of the pharmacy requesting a change or a PA, the patient used a discount card and paid the $483 out of pocket. She wants to know if she will have to pay that price every time.  So I told her not to pay that next time and have the pharmacy contact us first. She agreed. In addition to that she states she cant tolerate the lactulose as it is prescribed. Two packets a day is too much, works like a bowel prep. She now uses 1/2 pack every other day. Her stools are still pretty formed in the morning and will have 2-3 very small stools during the day.

## 2018-02-02 LAB — AFP TUMOR MARKER: AFP, Serum, Tumor Marker: 9.7 ng/mL — ABNORMAL HIGH (ref 0.0–8.3)

## 2018-02-05 ENCOUNTER — Ambulatory Visit
Admission: RE | Admit: 2018-02-05 | Discharge: 2018-02-05 | Disposition: A | Discharge status: Home / Self Care | Payer: BLUE CROSS/BLUE SHIELD | Source: Ambulatory Visit | Attending: Interventional Radiology | Admitting: Interventional Radiology

## 2018-02-05 ENCOUNTER — Encounter: Payer: Self-pay | Admitting: Radiology

## 2018-02-05 DIAGNOSIS — C220 Liver cell carcinoma: Secondary | ICD-10-CM

## 2018-02-05 DIAGNOSIS — Z9889 Other specified postprocedural states: Secondary | ICD-10-CM | POA: Diagnosis not present

## 2018-02-05 DIAGNOSIS — K7689 Other specified diseases of liver: Secondary | ICD-10-CM | POA: Diagnosis not present

## 2018-02-05 HISTORY — PX: IR RADIOLOGIST EVAL & MGMT: IMG5224

## 2018-02-05 NOTE — Progress Notes (Signed)
Patient ID: Renee Rice, female   DOB: 1955-07-09, 63 y.o.   MRN: [id number]         Patient was seen in consultation today for  Chief Complaint  Patient presents with  . Follow-up    3 mo follow up Y-90 SIRT     Referring Physician(s): Burr Medico (Oncology) Danis (GI)  History of Present Illness: Renee Rice is a 63 y.o. female with past medical history significant for hepatitis C cirrhosis who underwent technically successful Y 90 radioembolization of the right lobe of the liver on 11/02/2017, however procedure was complicated by acute worsening of liver function tests and the development of symptomatic ascites ultimately requiring multiple ultrasound-guided paracentesis and initiation of diuretic medications as coordinated by Dr. Loletha Carrow.  She returns today to the interventional radiology clinic following the acquisition of postprocedural abdominal MRI performed 01/24/2018.  She is accompanied by her partner though serves as her own historian.  I am very pleased/relieved to report patient is much improved both physically and emotionally following her extremely difficult recovery following Y 90 radioembolization.    Last ultrasound-guided paracentesis was performed on 12/31/2017 yielding 4.8 L of ascitic fluid however subsequent attempted paracentesis performed 01/11/2018 was negative for any significant intra-abdominal ascites and paracentesis was not performed at that time.  The patient states that her weight has been significantly reduced from 220 pounds (at it's most) to currently 187 pounds, and she does not feel she has a symptomatic ascites at this time.  Patient states that her appetite and energy level are also improved, though she is still unable to work.  Patient states she is pleased that she underwent the procedure, though is not interested in additional liver directed therapy at this time.    Past Medical History:  Diagnosis Date  . Allergic rhinitis   . Chronic viral  hepatitis C ([place]) 04/27/2017   with cirrhosis  . Depression    in the past, no meds, no previous hospitalization   . Heart murmur     Past Surgical History:  Procedure Laterality Date  . IR ANGIOGRAM SELECTIVE EACH ADDITIONAL VESSEL  10/19/2017  . IR ANGIOGRAM SELECTIVE EACH ADDITIONAL VESSEL  10/19/2017  . IR ANGIOGRAM SELECTIVE EACH ADDITIONAL VESSEL  10/19/2017  . IR ANGIOGRAM SELECTIVE EACH ADDITIONAL VESSEL  10/19/2017  . IR ANGIOGRAM SELECTIVE EACH ADDITIONAL VESSEL  11/02/2017  . IR ANGIOGRAM SELECTIVE EACH ADDITIONAL VESSEL  11/02/2017  . IR ANGIOGRAM SELECTIVE EACH ADDITIONAL VESSEL  11/02/2017  . IR ANGIOGRAM VISCERAL SELECTIVE  10/19/2017  . IR ANGIOGRAM VISCERAL SELECTIVE  10/19/2017  . IR ANGIOGRAM VISCERAL SELECTIVE  11/02/2017  . IR EMBO ARTERIAL NOT HEMORR HEMANG INC GUIDE ROADMAPPING  10/19/2017  . IR EMBO TUMOR ORGAN ISCHEMIA INFARCT INC GUIDE ROADMAPPING  11/02/2017  . IR PARACENTESIS  12/04/2017  . IR PARACENTESIS  12/07/2017  . IR PARACENTESIS  12/10/2017  . IR PARACENTESIS  12/18/2017  . IR PARACENTESIS  12/31/2017  . IR PARACENTESIS  01/04/2018  . IR RADIOLOGIST EVAL & MGMT  09/18/2017  . IR RADIOLOGIST EVAL & MGMT  11/20/2017  . IR RADIOLOGIST EVAL & MGMT  02/05/2018  . IR US GUIDE VASC ACCESS RIGHT  10/19/2017  . IR US GUIDE VASC ACCESS RIGHT  11/02/2017    Allergies: Patient has no known allergies.  Medications: Prior to Admission medications   Medication Sig Start Date End Date Taking? Authorizing Provider  acetaminophen (TYLENOL) 500 MG tablet Take 1,000 mg by mouth every 6 (six) hours as needed for  mild pain.    Yes [provider]  ALPRAZolam (XANAX) 0.25 MG tablet Take 1 tablet (0.25 mg total) by mouth at bedtime as needed for anxiety. 01/11/18  Yes Truitt Merle, MD  calcium carbonate (TUMS - DOSED IN MG ELEMENTAL CALCIUM) 500 MG chewable tablet Chew 1 tablet by mouth daily as needed for heartburn.    Yes [provider]  furosemide (LASIX)  40 MG tablet Take 1 tablet (40 mg total) by mouth daily. 12/24/17  Yes Danis, Kirke Corin, MD  guaiFENesin-codeine 100-10 MG/5ML syrup Take 10 mLs by mouth every 6 (six) hours as needed for cough. 01/11/18  Yes Truitt Merle, MD  lactulose (CEPHULAC) 10 g packet Take 1 packet (10 g total) by mouth 2 (two) times daily. 01/01/18  Yes Danis, Kirke Corin, MD  sertraline (ZOLOFT) 50 MG tablet Take 1 tablet (50 mg total) by mouth daily. 01/11/18  Yes Truitt Merle, MD  spironolactone (ALDACTONE) 100 MG tablet Take 1 tablet (100 mg total) by mouth daily. 12/24/17  Yes Danis, Kirke Corin, MD     Family History  Problem Relation Age of Onset  . Migraines Mother   . Cancer Father        biological father with lung to brain   . Migraines Maternal Grandmother     Social History   Socioeconomic History  . Marital status: Married    Spouse name: Not on file  . Number of children: 0  . Years of education: Not on file  . Highest education level: Not on file  Occupational History  . Occupation: Geophysicist/field seismologist, Hydrologist    Comment: full time   Social Needs  . Financial resource strain: Not on file  . Food insecurity:    Worry: Not on file    Inability: Not on file  . Transportation needs:    Medical: Not on file    Non-medical: Not on file  Tobacco Use  . Smoking status: Current Some Day Smoker    Packs/day: 0.25    Years: 30.00    Pack years: 7.50    Types: Cigarettes  . Smokeless tobacco: Never Used  Substance and Sexual Activity  . Alcohol use: Yes    Comment: occassional 2 beers a month; never heavy use  . Drug use: No    Comment: remote h/o IV drug use, none in decades   . Sexual activity: Not Currently  Lifestyle  . Physical activity:    Days per week: Not on file    Minutes per session: Not on file  . Stress: Not on file  Relationships  . Social connections:    Talks on phone: Not on file    Gets together: Not on file    Attends religious service: Not on file    Active member of club  or organization: Not on file    Attends meetings of clubs or organizations: Not on file    Relationship status: Not on file  Other Topics Concern  . Not on file  Social History Narrative  . Not on file    ECOG Status: 1 - Symptomatic but completely ambulatory  Review of Systems: A 12 point ROS discussed and pertinent positives are indicated in the HPI above.  All other systems are negative.  Review of Systems  Constitutional:       Patient reports improved appetite and energy level. Patient reports significant weight loss, currently weighing 187 pounds, previously 220 pounds at the time of her  worse abdominal ascites  Gastrointestinal:       Patient reports reduced abdominal distention and bloating stating her pants fit much better.  Psychiatric/Behavioral:       Patient reports significantly reduced anxiety.    Vital Signs: BP 135/70   Pulse 94   Temp 98 F (36.7 C) (Oral)   Resp 15   Ht 5\' 11"  (1.803 m)   Wt 84.8 kg   SpO2 94%   BMI 26.08 kg/m   Physical Exam Vitals signs and nursing note reviewed.  Constitutional:      Appearance: Normal appearance.  Skin:    Comments: Patient's face appears mildly jaundiced.  Neurological:     Mental Status: She is alert.  Psychiatric:        Mood and Affect: Mood normal.        Behavior: Behavior normal.     Comments: Patient appears much calmer and in a better state of mind      Imaging: Ct Angio Chest Pe W Or Wo Contrast  Result Date: 01/07/2018 CLINICAL DATA:  Hypoxia and chest pain EXAM: CT ANGIOGRAPHY CHEST WITH CONTRAST TECHNIQUE: Multidetector CT imaging of the chest was performed using the standard protocol during bolus administration of intravenous contrast. Multiplanar CT image reconstructions and MIPs were obtained to evaluate the vascular anatomy. CONTRAST:  18mL ISOVUE-370 IOPAMIDOL (ISOVUE-370) INJECTION 76% COMPARISON:  Chest CT 08/31/2017 FINDINGS: Cardiovascular: --Pulmonary arteries: Contrast injection  is sufficient to demonstrate satisfactory opacification of the pulmonary arteries to the segmental level. There is no pulmonary embolus. The main pulmonary artery is within normal limits for size. --Aorta: Satisfactory opacification of the thoracic aorta. No aortic dissection or other acute aortic syndrome. Conventional 3 vessel aortic branching pattern. The aortic course and caliber are normal. There is no aortic atherosclerosis. --Heart: Normal size. No pericardial effusion. Mediastinum/Nodes: No mediastinal, hilar or axillary lymphadenopathy. The visualized thyroid and thoracic esophageal course are unremarkable. Lungs/Pleura: Emphysema. Small left pleural effusion with associated atelectasis. Mild opacity in the right upper lobe. Upper Abdomen: Contrast bolus timing is not optimized for evaluation of the abdominal organs. Shrunken and nodular liver with moderate perihepatic and perisplenic ascites. Musculoskeletal: No chest wall abnormality. No acute or significant osseous findings. Review of the MIP images confirms the above findings. IMPRESSION: 1. No pulmonary embolus or acute aortic syndrome. 2. Small left pleural effusion with associated atelectasis. Mild opacity in the right upper lobe may indicate early pneumonia. 3. Hepatic cirrhosis with moderate perihepatic and perisplenic ascites. Emphysema (ICD10-J43.9). Electronically Signed   By: Ulyses Jarred M.D.   On: 01/07/2018 14:18   Mr Abdomen W Wo Contrast  Result Date: 01/24/2018 CLINICAL DATA:  Hepatitis C and cirrhosis. Prior Y 90 therapy. Hepatocellular carcinoma. EXAM: MRI ABDOMEN WITHOUT AND WITH CONTRAST TECHNIQUE: Multiplanar multisequence MR imaging of the abdomen was performed both before and after the administration of intravenous contrast. CONTRAST:  9 cc Gadavist COMPARISON:  CT 12/09/2017. Most recent abdominal MRI of 08/10/2017. FINDINGS: Portions of exam are mildly motion degraded. Lower chest: Similar moderate left pleural effusion since  the chest CT of 01/07/2018. Normal heart size. Hepatobiliary: Persistent gallbladder distension with gallstones at up to 10 mm. Marked cirrhosis. Heterogeneous arterial enhancement with relative hypoenhancement throughout the left hepatic lobe. This is nonspecific but may be treatment related. Segment 2 hyperenhancing focus measures 10 mm on image 39/1300 1 and is similar to 9 mm on the prior. No portal venous or delayed hypoenhancement in this area. The hepatocellular carcinoma within segment 3  is felt to be similar to minimally increased. Example at 12 mm including on image 47/1301 and 45/1303. This is similar on portal venous/delayed phase imaging, but measures slightly larger than 9 mm on prior arterial phase imaging. The hepatocellular carcinoma within segment 7 is decreased. This is not well evaluated on arterial phase imaging, but measures on the order of 8 mm on image 45/1302 versus 1.8 cm on the prior. No new suspicious lesions are identified. Pancreas:  Normal, without mass or ductal dilatation. Spleen:  Normal in size, without focal abnormality. Adrenals/Urinary Tract: Normal adrenal glands. Normal kidneys, without hydronephrosis. Stomach/Bowel: Gastric underdistention. Normal caliber of abdominal bowel loops. Vascular/Lymphatic: Marked narrowing of the celiac again identified. Aortic atherosclerosis. Patent portal and hepatic veins. Portal venous hypertension, with extensive portosystemic collaterals, and esophageal varices. No abdominal adenopathy. Other:  Large volume ascites, new since 08/10/2017.  Anasarca. Musculoskeletal: Mild convex left lumbar spine curvature. IMPRESSION: 1. Redemonstration of bilateral hepatocellular carcinomas. The segment 7 lesion is significantly decreased in size. The segment 3 lesion is similar to minimally enlarged. No new hepatocellular carcinomas are identified. 2. Mild motion degradation. 3. Cholelithiasis. 4. Moderate left pleural effusion with large volume ascites. 5.   Aortic Atherosclerosis (ICD10-I70.0). Electronically Signed   By: Abigail Miyamoto M.D.   On: 01/24/2018 09:32   Ir Abdomen US Limited  Result Date: 01/11/2018 CLINICAL DATA:  63 year old with history of hepatitis-C, cirrhosis and hepatocellular carcinoma. Patient presented with abdominal distension and evaluation for a paracentesis. EXAM: LIMITED ABDOMEN ULTRASOUND FOR ASCITES TECHNIQUE: Limited ultrasound survey for ascites was performed in all four abdominal quadrants. COMPARISON:  12/31/2017 FINDINGS: Small amount of ascites identified in the abdomen. Not enough fluid for a therapeutic paracentesis. IMPRESSION: Small amount of ascites.  Paracentesis not performed. Electronically Signed   By: Markus Daft M.D.   On: 01/11/2018 17:04   Ir Radiologist Eval & Mgmt  Result Date: 02/05/2018 Please refer to notes tab for details about interventional procedure. (Op Note)   Labs:  CBC: Recent Labs    12/09/17 1249 12/10/17 0547 01/05/18 1427 01/06/18 0223  WBC 4.1 3.4* 8.0 7.2  HGB 13.5 12.0 12.8 12.3  HCT 43.1 36.1 38.4 34.8*  PLT 63* 57* 76* 76*    COAGS: Recent Labs    10/19/17 0820 11/02/17 0827 12/09/17 1249 12/17/17 1114 01/01/18 1635 01/05/18 1427  INR 1.13 1.25 1.48 1.6* 1.6* 1.69  APTT 36 36 36  --   --   --     BMP: Recent Labs    12/09/17 1249 12/10/17 0547 12/27/17 0902 01/05/18 1427 01/06/18 0223 02/01/18 1354  NA 135 135 133* 131* 131* 131*  K 4.1 4.1 4.4 4.4 4.3 4.5  CL 108 111 103 97* 99 102  CO2 21* 20* 25 24 22 23   GLUCOSE 119* 116* 103* 125* 105* 105*  BUN 12 12 16 17 15 12   CALCIUM 8.3* 7.8* 8.2* 8.6* 8.3* 8.7  CREATININE 0.71 0.78 0.78 1.07* 0.85 0.64  GFRNONAA >60 >60  --  56* >60  --   GFRAA >60 >60  --  >60 >60  --     LIVER FUNCTION TESTS: Recent Labs    12/17/17 1114 12/27/17 0902 01/05/18 1427 01/06/18 0223  BILITOT 2.8* 2.8* 3.8* 3.2*  AST 63* 65* 83* 74*  ALT 35 33 41 35  ALKPHOS 196* 206* 160* 175*  PROT 6.4 6.5 6.4* 5.8*    ALBUMIN 2.5* 2.5* 2.7* 2.5*    TUMOR MARKERS: Recent Labs  07/04/17 1531 09/20/17 1232  AFPTM 27.8* 21.8*   AFP - 9.7 on 02/02/2018.  Assessment and Plan:  Javona Bergevin is a 63 y.o. female with past medical history significant for hepatitis C cirrhosis who underwent technically successful Y 90 radioembolization of the right lobe of the liver on 11/02/2017, however procedure was complicated by acute worsening of liver function tests and the development of symptomatic ascites ultimately requiring multiple ultrasound-guided paracentesis and initiation of diuretic medications as coordinated by Dr. Loletha Carrow.    I am very pleased/relieved to report patient is much improved both physically and emotionally following her extremely difficult recovery following Y 90 radioembolization.    As above, the patient has not not undergone ultrasound-guided paracentesis since mid December with significant reduction in her weight and she currently denies symptomatic ascites.  She reports compliance with Dr. Loletha Carrow' diuretic regimen.  Selected images from preprocedural abdominal MRI performed 08/10/2017 and postprocedural abdominal MRI performed 01/24/2017 as well as selected intraprocedural images during Y 90 mapping procedure performed 10/19/2017 and Y 90 radial embolization of the right lobe of the liver performed 11/02/2017 were reviewed in detail with the patient and her partner.  Postprocedural MRI demonstrates significant reduction in the dominant lesion with the posterior segment of the right lobe of the liver, currently measuring 0.8 cm, previously, 1.8 cm, as well as nonvisualization of the additional indeterminate lesion with the central aspect of the right lobe of the liver and reduction in ill-defined heterogeneous enhancement about the peripheral aspects of the anterior segment of the right lobe of the liver. Not surprisingly, the lesion within the left lobe of liver has minimally increased in size,  currently measuring approximately 1.2 cm, previously, 0.9 cm, as the patient was unable to undergo radioembolization of the left lobe of the liver given acute liver decompensation.  These findings are also associated with reduction in patient's AFP level, previously 27.8 on 07/05/2017, 9.7 on 02/02/2018.  Postprocedural MRI also demonstrates significant reduction in size of the treated right lobe of the liver and development small to moderate amount of intra-abdominal ascites.  PLAN: - While post procedural images and AFP level suggests a technically successful result, given the patient's persistently elevated LFTs and ascites, though now small in volume and asymptomatic, she is no longer a candidate for liver directed therapy (either ablation or additional transcatheter treatment options).  As such, I will defer continued oncologic surveillance and management to Dr. Burr Medico.    The patient may follow-up at the interventional radiology clinic on a as needed basis and may undergo ultrasound-guided paracenteses at the interventional radiology drain clinic fluid clinic at [facility] as deemed appropriate by Dr. Loletha Carrow.  Again, my sincere thanks to both Drs. Burr Medico and Danis and helping manage this patient's extremely rocky, though ultimately moderately successful post Y 90 recovery.  The patient knows to call the interventional radiology clinic with any interval questions or concerns.  Thank you for this interesting consult.  I greatly enjoyed meeting Renee Rice and look forward to participating in their care.  A copy of this report was sent to the requesting provider on this date.  Electronically Signed: Sandi Mariscal 02/05/2018, 10:40 AM   I spent a total of 25 Minutes in face to face in clinical consultation, greater than 50% of which was counseling/coordinating care for post Y-90

## 2018-02-05 NOTE — Telephone Encounter (Signed)
I do not know what the medication costs for each individual, as it depends on their insurance/deductible/copay/prescription plan/time of year.  That said, I understand why that is cost-prohibitive for her.  I hope she can take it on a regular basis, and her plan seems to be fine as long as she has 2-3 soft BM's per day.

## 2018-02-05 NOTE — Telephone Encounter (Signed)
I have spoken to patient to advise of Dr Loletha Carrow' response and she verbalizes understanding.   She states she is also going to be sending some requests to pharmacy for some more of her medications so to be on the lookout for those.

## 2018-02-06 ENCOUNTER — Encounter: Payer: Self-pay | Admitting: *Deleted

## 2018-02-08 ENCOUNTER — Other Ambulatory Visit: Payer: Self-pay

## 2018-02-08 ENCOUNTER — Encounter: Payer: Self-pay | Admitting: Nurse Practitioner

## 2018-02-08 DIAGNOSIS — J189 Pneumonia, unspecified organism: Secondary | ICD-10-CM | POA: Diagnosis not present

## 2018-02-08 DIAGNOSIS — R0601 Orthopnea: Secondary | ICD-10-CM | POA: Diagnosis not present

## 2018-02-08 DIAGNOSIS — R188 Other ascites: Secondary | ICD-10-CM | POA: Diagnosis not present

## 2018-02-08 MED ORDER — SPIRONOLACTONE 100 MG PO TABS: 100.0000 mg | ORAL_TABLET | Freq: Every day | ORAL | 2 refills | Status: DC

## 2018-02-08 MED ORDER — FUROSEMIDE 40 MG PO TABS: 40.0000 mg | ORAL_TABLET | Freq: Every day | ORAL | 2 refills | Status: DC

## 2018-02-11 ENCOUNTER — Telehealth: Payer: Self-pay | Admitting: Gastroenterology

## 2018-02-11 NOTE — Telephone Encounter (Signed)
Can you please verify the instructions for Tequisha's diuretics.  I have it listed as Lasix 40mg  and Aldactone 100mg  . Patient said she is taking 60mg  of Lasix and 150 mg of Aldactone. Please clarify. Thank you

## 2018-02-11 NOTE — Telephone Encounter (Signed)
Pt called in with question about the medication she states that pharm contacted provider for auth but has not recv a response.

## 2018-02-11 NOTE — Telephone Encounter (Signed)
Spironolactone 150 mg once daily Furosemide 60 mg once daily  The dose increase may have difficult for you to find because it was in a TC note from 01/07/18  If new prescriptions necessary in order for her to have sufficient supply, please do so. - HD

## 2018-02-12 ENCOUNTER — Other Ambulatory Visit: Payer: Self-pay

## 2018-02-12 MED ORDER — FUROSEMIDE 40 MG PO TABS: ORAL_TABLET | ORAL | 2 refills | Status: DC

## 2018-02-12 MED ORDER — SPIRONOLACTONE 50 MG PO TABS: ORAL_TABLET | ORAL | 2 refills | Status: DC

## 2018-02-12 NOTE — Telephone Encounter (Signed)
Corrections have been made to current medications

## 2018-02-15 ENCOUNTER — Encounter (HOSPITAL_COMMUNITY): Payer: Self-pay | Admitting: Physician Assistant

## 2018-02-15 ENCOUNTER — Ambulatory Visit: Payer: PRIVATE HEALTH INSURANCE | Admitting: Family Medicine

## 2018-02-25 ENCOUNTER — Other Ambulatory Visit: Payer: Self-pay | Admitting: Hematology

## 2018-02-25 DIAGNOSIS — G9340 Encephalopathy, unspecified: Secondary | ICD-10-CM | POA: Diagnosis not present

## 2018-02-25 DIAGNOSIS — K7469 Other cirrhosis of liver: Secondary | ICD-10-CM | POA: Diagnosis not present

## 2018-02-25 DIAGNOSIS — B182 Chronic viral hepatitis C: Secondary | ICD-10-CM | POA: Diagnosis not present

## 2018-02-25 MED ORDER — ALPRAZOLAM 0.25 MG PO TABS: 0.2500 mg | ORAL_TABLET | Freq: Every evening | ORAL | 1 refills | Status: AC | PRN

## 2018-03-08 ENCOUNTER — Ambulatory Visit (INDEPENDENT_AMBULATORY_CARE_PROVIDER_SITE_OTHER): Payer: BLUE CROSS/BLUE SHIELD | Admitting: Gastroenterology

## 2018-03-08 ENCOUNTER — Encounter: Payer: Self-pay | Admitting: Gastroenterology

## 2018-03-08 ENCOUNTER — Other Ambulatory Visit (INDEPENDENT_AMBULATORY_CARE_PROVIDER_SITE_OTHER): Payer: BLUE CROSS/BLUE SHIELD

## 2018-03-08 VITALS — BP 128/64 | HR 92 | Ht 71.0 in | Wt 188.2 lb

## 2018-03-08 DIAGNOSIS — R188 Other ascites: Secondary | ICD-10-CM

## 2018-03-08 DIAGNOSIS — B182 Chronic viral hepatitis C: Secondary | ICD-10-CM

## 2018-03-08 DIAGNOSIS — C220 Liver cell carcinoma: Secondary | ICD-10-CM

## 2018-03-08 DIAGNOSIS — R945 Abnormal results of liver function studies: Secondary | ICD-10-CM | POA: Diagnosis not present

## 2018-03-08 DIAGNOSIS — R7989 Other specified abnormal findings of blood chemistry: Secondary | ICD-10-CM

## 2018-03-08 DIAGNOSIS — K7460 Unspecified cirrhosis of liver: Secondary | ICD-10-CM | POA: Diagnosis not present

## 2018-03-08 DIAGNOSIS — R112 Nausea with vomiting, unspecified: Secondary | ICD-10-CM

## 2018-03-08 DIAGNOSIS — R609 Edema, unspecified: Secondary | ICD-10-CM

## 2018-03-08 DIAGNOSIS — R1084 Generalized abdominal pain: Secondary | ICD-10-CM

## 2018-03-08 LAB — BASIC METABOLIC PANEL
BUN: 22 mg/dL (ref 6–23)
CO2: 24 mEq/L (ref 19–32)
Calcium: 8.4 mg/dL (ref 8.4–10.5)
Chloride: 92 mEq/L — ABNORMAL LOW (ref 96–112)
Creatinine, Ser: 0.86 mg/dL (ref 0.40–1.20)
GFR: 67.04 mL/min (ref >60.00)
GLUCOSE: 100 mg/dL — AB (ref 70–99)
Potassium: 4.8 mEq/L (ref 3.5–5.1)
Sodium: 122 mEq/L — ABNORMAL LOW (ref 135–145)

## 2018-03-08 NOTE — Patient Instructions (Addendum)
If you are age 62 or older, your body mass index should be between 23-30. Your Body mass index is 26.26 kg/m. If this is out of the aforementioned range listed, please consider follow up with your Primary Care Provider.  If you are age 4 or younger, your body mass index should be between 19-25. Your Body mass index is 26.26 kg/m. If this is out of the aformentioned range listed, please consider follow up with your Primary Care Provider.   You have been scheduled for an endoscopy. Please follow written instructions given to you at your visit today. If you use inhalers (even only as needed), please bring them with you on the day of your procedure. Your physician has requested that you go to www.startemmi.com and enter the access code given to you at your visit today. This web site gives a general overview about your procedure. However, you should still follow specific instructions given to you by our office regarding your preparation for the procedure.  Increase spirolactone to 200 mg a day  Increase furosemide to 80 mg a day  It was a pleasure to see you today!  Dr. Loletha Carrow

## 2018-03-08 NOTE — Progress Notes (Signed)
[facility] Progress Note  Chief Complaint: Cirrhosis  Subjective  History:  Renee Rice was last seen December 17 with decompensated liver function after interventional radiology treatment of multifocal [place].  She had refractory ascites and could only tolerate low-dose diuretics due to blood pressure and renal function. She also had a chronic cough, and a chest x-ray that day showed pneumonia for which she required a hospital admission. She was seen in follow-up with IR, no current therapy is planned since he does not feel she will tolerate any further radio embolization treatment because of the severity of liver condition and the decompensation she appearance to after the prior treatment.  It was also felt that the risks of an indwelling Pleurx catheter in the peritoneum for ascites was greater than expected benefits.  The malignancy will be followed radiographically.   Renee Rice also followed up with Dr.Feng of oncology, who sent a referral to [facility] hepatology transplant clinic, started Zoloft and Xanax, and discussed other systemic therapy for hepatoma.  She has known hepatitis C virus and treatment has been on hold since the hepatoma was discovered.  She is currently taking lactulose every other day due to its expense and that was a sufficient dose to get 2-3 soft bowel movements per day. Her diuretics are currently furosemide 60 mg daily and spironolactone 150 mg daily. Last paracentesis December 20.     Renee Rice is here with her husband today.  She feels generally unwell much of the time, with fatigue, decreased mood, generalized abdominal pain that comes and goes.  She has nausea and vomiting, declined offer for antinausea medicine today. Her appetite is fair and weight has been slowly increasing because she is gaining more ascites. She feels she is up about 10 pounds from her usual post paracentesis weight.  She has not had melena or rectal bleeding.  She was seen by [facility], and says she was prescribed rifaximin.  She has been taking that twice a day and has not taken the lactulose because she does not like the diarrhea causes and it was too expensive.  She also tells me that they did not feel that hepatitis C should be treated at this time.  I will make a request for that office note.   ROS: Cardiovascular:  no chest pain Respiratory: Her cough resolved after the hospitalization for pneumonia.  Sometimes she feels dyspneic with exertion.  Intermittent back pain Depression Poor sleep Remainder of systems negative except as above  The patient's Past Medical, Family and Social History were reviewed and are on file in the EMR.  Objective:  Med list reviewed  Current Outpatient Medications:  .  ALPRAZolam (XANAX) 0.25 MG tablet, Take 1 tablet (0.25 mg total) by mouth at bedtime as needed for anxiety., Disp: 30 tablet, Rfl: 1 .  calcium carbonate (TUMS - DOSED IN MG ELEMENTAL CALCIUM) 500 MG chewable tablet, Chew 1 tablet by mouth daily as needed for heartburn. , Disp: , Rfl:  .  furosemide (LASIX) 20 MG tablet, Take 20 mg by mouth daily. Taking 60mg  daily, Disp: , Rfl:  .  lactulose (CEPHULAC) 10 g packet, Take 1 packet (10 g total) by mouth 2 (two) times daily. (Patient taking differently: Take 10 g by mouth 3 (three) times daily. 30g daily), Disp: 60 each, Rfl: 3 .  levOCARNitine (CARNITOR) 1 GM/10ML solution, Take 10 mLs by mouth daily., Disp: , Rfl:  .  sertraline (ZOLOFT) 50 MG tablet, Take 1 tablet (50 mg  total) by mouth daily., Disp: 30 tablet, Rfl: 1 .  spironolactone (ALDACTONE) 50 MG tablet, Please take 3 by mouth daily for a total of 150mg  a day, Disp: 90 tablet, Rfl: 2 .  XIFAXAN 550 MG TABS tablet, Take 1 tablet by mouth 2 (two) times daily., Disp: , Rfl:    Vital signs in last 24 hrs: Vitals:   03/08/18 1341  BP: 128/64  Pulse: 92    Physical Exam  Chronically ill-appearing woman, pleasant and conversational, depressed affect.   Her husband is with her for the entire encounter today.  HEENT: sclera icteric, oral mucosa moist without lesions  Neck: supple, no thyromegaly, JVD or lymphadenopathy  Cardiac: RRR without murmurs, S1S2 heard, mild peripheral edema  Pulm: clear to auscultation bilaterally, normal RR and effort noted  Abdomen: soft, protuberant with ascites but not tense. + Upper nominal tenderness, with active bowel sounds.  Left lobe liver enlarged as before, no spleen tip palpable.  Skin; warm and dry, n jaundiced and multiple spider nevi neck and upper chest wall  Recent Labs:  CBC Latest Ref Rng & Units 01/06/2018 01/05/2018 12/10/2017  WBC 4.0 - 10.5 K/uL 7.2 8.0 3.4(L)  Hemoglobin 12.0 - 15.0 g/dL 12.3 12.8 12.0  Hematocrit 36.0 - 46.0 % 34.8(L) 38.4 36.1  Platelets 150 - 400 K/uL 76(L) 76(L) 57(L)   CMP Latest Ref Rng & Units 02/01/2018 01/06/2018 01/05/2018  Glucose 70 - 99 mg/dL 105(H) 105(H) 125(H)  BUN 6 - 23 mg/dL 12 15 17   Creatinine 0.40 - 1.20 mg/dL 0.64 0.85 1.07(H)  Sodium 135 - 145 mEq/L 131(L) 131(L) 131(L)  Potassium 3.5 - 5.1 mEq/L 4.5 4.3 4.4  Chloride 96 - 112 mEq/L 102 99 97(L)  CO2 19 - 32 mEq/L 23 22 24   Calcium 8.4 - 10.5 mg/dL 8.7 8.3(L) 8.6(L)  Total Protein 6.5 - 8.1 g/dL - 5.8(L) 6.4(L)  Total Bilirubin 0.3 - 1.2 mg/dL - 3.2(H) 3.8(H)  Alkaline Phos 38 - 126 U/L - 175(H) 160(H)  AST 15 - 41 U/L - 74(H) 83(H)  ALT 0 - 44 U/L - 35 41   Las tINR 1.7 on 01/05/18 02/01/18:  AFP = 9.7  Radiologic studies:  CLINICAL DATA:  Hepatitis C and cirrhosis. Prior Y 90 therapy. Hepatocellular carcinoma.   EXAM: MRI ABDOMEN WITHOUT AND WITH CONTRAST   TECHNIQUE: Multiplanar multisequence MR imaging of the abdomen was performed both before and after the administration of intravenous contrast.   CONTRAST:  9 cc Gadavist   COMPARISON:  CT 12/09/2017. Most recent abdominal MRI of 08/10/2017.   FINDINGS: Portions of exam are mildly motion degraded.   Lower chest:  Similar moderate left pleural effusion since the chest CT of 01/07/2018. Normal heart size.   Hepatobiliary: Persistent gallbladder distension with gallstones at up to 10 mm.   Marked cirrhosis. Heterogeneous arterial enhancement with relative hypoenhancement throughout the left hepatic lobe. This is nonspecific but may be treatment related.   Segment 2 hyperenhancing focus measures 10 mm on image 39/1300 1 and is similar to 9 mm on the prior. No portal venous or delayed hypoenhancement in this area.   The hepatocellular carcinoma within segment 3 is felt to be similar to minimally increased. Example at 12 mm including on image 47/1301 and 45/1303. This is similar on portal venous/delayed phase imaging, but measures slightly larger than 9 mm on prior arterial phase imaging.   The hepatocellular carcinoma within segment 7 is decreased. This is not well evaluated on arterial phase imaging,  but measures on the order of 8 mm on image 45/1302 versus 1.8 cm on the prior.   No new suspicious lesions are identified.   Pancreas:  Normal, without mass or ductal dilatation.   Spleen:  Normal in size, without focal abnormality.   Adrenals/Urinary Tract: Normal adrenal glands. Normal kidneys, without hydronephrosis.   Stomach/Bowel: Gastric underdistention. Normal caliber of abdominal bowel loops.   Vascular/Lymphatic: Marked narrowing of the celiac again identified. Aortic atherosclerosis. Patent portal and hepatic veins. Portal venous hypertension, with extensive portosystemic collaterals, and esophageal varices.   No abdominal adenopathy.   Other:  Large volume ascites, new since 08/10/2017.  Anasarca.   Musculoskeletal: Mild convex left lumbar spine curvature.   IMPRESSION: 1. Redemonstration of bilateral hepatocellular carcinomas. The segment 7 lesion is significantly decreased in size. The segment 3 lesion is similar to minimally enlarged. No new hepatocellular carcinomas  are identified. 2. Mild motion degradation. 3. Cholelithiasis. 4. Moderate left pleural effusion with large volume ascites. 5.  Aortic Atherosclerosis (ICD10-I70.0).     Electronically Signed   By: Abigail Miyamoto M.D.   On: 01/24/2018 09:32 CLINICAL DATA:  Hepatitis C and cirrhosis. Prior Y 90 therapy. Hepatocellular carcinoma.   EXAM: MRI ABDOMEN WITHOUT AND WITH CONTRAST   TECHNIQUE: Multiplanar multisequence MR imaging of the abdomen was performed both before and after the administration of intravenous contrast.   CONTRAST:  9 cc Gadavist   COMPARISON:  CT 12/09/2017. Most recent abdominal MRI of 08/10/2017.   FINDINGS: Portions of exam are mildly motion degraded.   Lower chest: Similar moderate left pleural effusion since the chest CT of 01/07/2018. Normal heart size.   Hepatobiliary: Persistent gallbladder distension with gallstones at up to 10 mm.   Marked cirrhosis. Heterogeneous arterial enhancement with relative hypoenhancement throughout the left hepatic lobe. This is nonspecific but may be treatment related.   Segment 2 hyperenhancing focus measures 10 mm on image 39/1300 1 and is similar to 9 mm on the prior. No portal venous or delayed hypoenhancement in this area.   The hepatocellular carcinoma within segment 3 is felt to be similar to minimally increased. Example at 12 mm including on image 47/1301 and 45/1303. This is similar on portal venous/delayed phase imaging, but measures slightly larger than 9 mm on prior arterial phase imaging.   The hepatocellular carcinoma within segment 7 is decreased. This is not well evaluated on arterial phase imaging, but measures on the order of 8 mm on image 45/1302 versus 1.8 cm on the prior.   No new suspicious lesions are identified.   Pancreas:  Normal, without mass or ductal dilatation.   Spleen:  Normal in size, without focal abnormality.   Adrenals/Urinary Tract: Normal adrenal glands. Normal  kidneys, without hydronephrosis.   Stomach/Bowel: Gastric underdistention. Normal caliber of abdominal bowel loops.   Vascular/Lymphatic: Marked narrowing of the celiac again identified. Aortic atherosclerosis. Patent portal and hepatic veins. Portal venous hypertension, with extensive portosystemic collaterals, and esophageal varices.   No abdominal adenopathy.   Other:  Large volume ascites, new since 08/10/2017.  Anasarca.   Musculoskeletal: Mild convex left lumbar spine curvature.   IMPRESSION: 1. Redemonstration of bilateral hepatocellular carcinomas. The segment 7 lesion is significantly decreased in size. The segment 3 lesion is similar to minimally enlarged. No new hepatocellular carcinomas are identified. 2. Mild motion degradation. 3. Cholelithiasis. 4. Moderate left pleural effusion with large volume ascites. 5.  Aortic Atherosclerosis (ICD10-I70.0).     Electronically Signed   By: Abigail Miyamoto  M.D.   On: 01/24/2018 09:32   @ASSESSMENTPLANBEGIN @ Assessment: Encounter Diagnoses  Name Primary?  . Cirrhosis of liver with ascites, unspecified hepatic cirrhosis type ([place]) Yes  . Chronic hepatitis C without hepatic coma ([place])   . Other ascites   . Elevated LFTs   . Hepatocellular carcinoma ([place])   . Peripheral edema     Her overall condition is stable over about the last 6 to 8 weeks.  She had a decompensation of hepatic function after the IR treatment, but it finally stabilized.  Blood pressure has improved from before and renal function lately normal.  She still seems to be having difficulty coping with her diagnosis.  Interventional radiology and oncology are monitoring her malignancy.  Her ascites has lately been worsening.  Hepatic encephalopathy with subtle symptoms of sleep disturbance balance and fatigue.  Fortunately, she is able to to afford and take rifaximin.  Plan: Increase spironolactone to 200 mg daily and increase furosemide to 80 mg  daily  BMP in 2 weeks  Upper endoscopy to screen for esophageal varices.  Her renal function is currently stable and her blood pressure is such that she could probably tolerate nonselective beta-blocker therapy if needed.  I suspect she will most likely have varices significant size requiring primary prophylaxis. Upper endoscopy was described in detail along with risks and benefits and she is agreeable.  The benefits and risks of the planned procedure were described in detail with the patient or (when appropriate) their health care proxy.  Risks were outlined as including, but not limited to, bleeding, infection, perforation, adverse medication reaction leading to cardiac or pulmonary decompensation.  The limitation of incomplete mucosal visualization was also discussed.  No guarantees or warranties were given.  Patient at increased risk for cardiopulmonary complications of procedure due to medical comorbidities.   Total time 40 minutes, over half spent face-to-face with patient in counseling and coordination of care.   Nelida Meuse III

## 2018-03-11 ENCOUNTER — Other Ambulatory Visit: Payer: Self-pay

## 2018-03-11 DIAGNOSIS — J189 Pneumonia, unspecified organism: Secondary | ICD-10-CM | POA: Diagnosis not present

## 2018-03-11 DIAGNOSIS — E871 Hypo-osmolality and hyponatremia: Secondary | ICD-10-CM

## 2018-03-12 ENCOUNTER — Telehealth: Payer: Self-pay | Admitting: Gastroenterology

## 2018-03-12 NOTE — Telephone Encounter (Signed)
Pt called in wanting to speak with the nurse about the results she was giving on yesterday. Also she is wanting to discuss something's about the upcoming procedures.

## 2018-03-12 NOTE — Telephone Encounter (Signed)
Pt states she is uncomfortable and is requesting a paracentesis. Please advise.

## 2018-03-12 NOTE — Telephone Encounter (Signed)
It will have to wait until we see what happens with the sodium level first.  Must ensure stable kidney function before paracentesis

## 2018-03-12 NOTE — Telephone Encounter (Signed)
Pt wanting to get schedule for a paracentesis

## 2018-03-12 NOTE — Telephone Encounter (Signed)
Spoke with pt and reviewed pts lab results and instructions again with pt. She verbalized understanding.

## 2018-03-13 NOTE — Telephone Encounter (Signed)
Spoke with pt and she is aware and will have labs done tomorrow.

## 2018-03-14 ENCOUNTER — Other Ambulatory Visit (INDEPENDENT_AMBULATORY_CARE_PROVIDER_SITE_OTHER): Payer: BLUE CROSS/BLUE SHIELD

## 2018-03-14 DIAGNOSIS — E871 Hypo-osmolality and hyponatremia: Secondary | ICD-10-CM | POA: Diagnosis not present

## 2018-03-14 LAB — BASIC METABOLIC PANEL
BUN: 21 mg/dL (ref 6–23)
CO2: 24 mEq/L (ref 19–32)
Calcium: 8.1 mg/dL — ABNORMAL LOW (ref 8.4–10.5)
Chloride: 95 mEq/L — ABNORMAL LOW (ref 96–112)
Creatinine, Ser: 0.86 mg/dL (ref 0.40–1.20)
GFR: 67.04 mL/min (ref >60.00)
Glucose, Bld: 122 mg/dL — ABNORMAL HIGH (ref 70–99)
Potassium: 4.6 mEq/L (ref 3.5–5.1)
Sodium: 124 mEq/L — ABNORMAL LOW (ref 135–145)

## 2018-03-15 ENCOUNTER — Other Ambulatory Visit: Payer: Self-pay

## 2018-03-15 DIAGNOSIS — E871 Hypo-osmolality and hyponatremia: Secondary | ICD-10-CM

## 2018-03-15 DIAGNOSIS — R188 Other ascites: Secondary | ICD-10-CM

## 2018-03-15 MED ORDER — ALBUMIN HUMAN 25 % IV SOLN: 25.0000 g | Freq: Once | INTRAVENOUS | Status: AC

## 2018-03-16 LAB — CREATININE, URINE, RANDOM: Creatinine, Urine: 65 mg/dL (ref 20–275)

## 2018-03-16 LAB — SODIUM, URINE, RANDOM: Sodium, Ur: 81 mmol/L (ref 28–272)

## 2018-03-20 ENCOUNTER — Other Ambulatory Visit: Payer: Self-pay

## 2018-03-20 ENCOUNTER — Ambulatory Visit (INDEPENDENT_AMBULATORY_CARE_PROVIDER_SITE_OTHER): Payer: BLUE CROSS/BLUE SHIELD | Admitting: Family Medicine

## 2018-03-20 ENCOUNTER — Ambulatory Visit (INDEPENDENT_AMBULATORY_CARE_PROVIDER_SITE_OTHER): Payer: BLUE CROSS/BLUE SHIELD

## 2018-03-20 ENCOUNTER — Telehealth: Payer: Self-pay | Admitting: Gastroenterology

## 2018-03-20 ENCOUNTER — Encounter: Payer: Self-pay | Admitting: Family Medicine

## 2018-03-20 ENCOUNTER — Ambulatory Visit: Payer: BLUE CROSS/BLUE SHIELD | Admitting: Gastroenterology

## 2018-03-20 ENCOUNTER — Encounter: Payer: Self-pay | Admitting: Gastroenterology

## 2018-03-20 VITALS — BP 105/94 | HR 52 | Temp 97.8°F | Resp 16 | Ht 71.0 in

## 2018-03-20 VITALS — BP 116/62 | HR 101 | Temp 98.4°F

## 2018-03-20 DIAGNOSIS — R05 Cough: Secondary | ICD-10-CM | POA: Diagnosis not present

## 2018-03-20 DIAGNOSIS — J181 Lobar pneumonia, unspecified organism: Secondary | ICD-10-CM | POA: Diagnosis not present

## 2018-03-20 DIAGNOSIS — R058 Other specified cough: Secondary | ICD-10-CM

## 2018-03-20 DIAGNOSIS — J189 Pneumonia, unspecified organism: Secondary | ICD-10-CM

## 2018-03-20 MED ORDER — CEFTRIAXONE SODIUM 500 MG IJ SOLR
500.0000 mg | Freq: Once | INTRAMUSCULAR | Status: AC
  Administered 2018-03-20: 500 mg via INTRAMUSCULAR

## 2018-03-20 MED ORDER — CEFDINIR 300 MG PO CAPS: 300.0000 mg | ORAL_CAPSULE | Freq: Two times a day (BID) | ORAL | 0 refills | Status: DC

## 2018-03-20 MED ORDER — SODIUM CHLORIDE 0.9 % IV SOLN: 500.0000 mL | Freq: Once | INTRAVENOUS | Status: AC

## 2018-03-20 NOTE — Progress Notes (Signed)
Dr. Loletha Carrow came at 0750, said he will call or have his office call her to reschedule.

## 2018-03-20 NOTE — Telephone Encounter (Signed)
Spoke with pts husband and he is aware. States pt has an appt with her PCP this afternoon, he will have lab drawn there.

## 2018-03-20 NOTE — Telephone Encounter (Signed)
Renee Rice was scheduled for routine EGD today for variceal screening.  In the preop area she was evaluated by nursing and the CRNA.  She reportedly complained of progressive abdominal distention from ascites, nausea, intermittent vomiting and also cough productive of colored sputum.  The CRNA expressed concern about proceeding with the upper endoscopy today and wanted to confer with me.  I arrived shortly afterwards, but was unable to evaluate Renee Rice because she left.  She is scheduled for paracentesis 2 days from now, but it was also dependent upon results of sodium and creatinine.  It does not appear that she went to the lab yesterday as requested for a BMP.  I was unable to contact Renee Rice at the number she left, [phone].  Please try to reach her this morning and find out what her plans are.  It sounds like she might have a respiratory infection, and should seek primary care evaluation today.  She definitely needs to have a BMP drawn, and if she is seen primary care, it would be a good opportunity to do that as well.  If primary care is unable to see her, or if she is feeling dyspneic from the degree of ascites, then she should go to the emergency room today.

## 2018-03-20 NOTE — Telephone Encounter (Signed)
Left message for pt to call back  °

## 2018-03-20 NOTE — Patient Instructions (Addendum)
  CHEST - 2 VIEW  COMPARISON:  Chest CTA dated 01/07/2018. Chest radiographs dated 01/04/2018.  FINDINGS: Again demonstrated is a small moderate-sized left pleural effusion. Mild patchy airspace opacity at the left lung base. Clear right lung. Mild diffuse peribronchial thickening. Normal sized heart. Mild dextroconvex thoracic scoliosis and degenerative changes. Approximately 20% L1 superior endplate compression deformity without visible fracture lines or bony retropulsion, unchanged since 01/04/2018.  IMPRESSION: 1. Mild patchy atelectasis or pneumonia at the left lung base. 2. Stable small moderate-sized left pleural effusion. 3. Mild bronchitic changes.   Electronically Signed   By: Claudie Revering M.D.   On: 03/20/2018 17:42      If you have lab work done today you will be contacted with your lab results within the next 2 weeks.  If you have not heard from [place] then please contact us. The fastest way to get your results is to register for My Chart.   IF you received an x-ray today, you will receive an invoice from [facility]. Please contact [facility] at [phone] with questions or concerns regarding your invoice.   IF you received labwork today, you will receive an invoice from [facility]. Please contact [facility] at [phone] with questions or concerns regarding your invoice.   Our billing staff will not be able to assist you with questions regarding bills from these companies.  You will be contacted with the lab results as soon as they are available. The fastest way to get your results is to activate your My Chart account. Instructions are located on the last page of this paperwork. If you have not heard from [place] regarding the results in 2 weeks, please contact this office.

## 2018-03-20 NOTE — Progress Notes (Signed)
Established Patient Office Visit  Subjective:  Patient ID: Renee Rice, female    DOB: 11/30/1955  Age: 63 y.o. MRN: [id number]  CC:  Chief Complaint  Patient presents with  . URI    pt states she has a cough,congestion,shob, and mucus x 2 weeks     HPI Renee Rice presents for   2 week history of cough with green mucus Congestion, shortness of breath She had an episode of pneumonia  She has a history of ascites and was due for paracentesis but was advised to follow up to rule out pneumonia or pulm infection  Past Medical History:  Diagnosis Date  . Allergic rhinitis   . Chronic viral hepatitis C ([place]) 04/27/2017   with cirrhosis  . Depression    in the past, no meds, no previous hospitalization   . Heart murmur     Past Surgical History:  Procedure Laterality Date  . IR ANGIOGRAM SELECTIVE EACH ADDITIONAL VESSEL  10/19/2017  . IR ANGIOGRAM SELECTIVE EACH ADDITIONAL VESSEL  10/19/2017  . IR ANGIOGRAM SELECTIVE EACH ADDITIONAL VESSEL  10/19/2017  . IR ANGIOGRAM SELECTIVE EACH ADDITIONAL VESSEL  10/19/2017  . IR ANGIOGRAM SELECTIVE EACH ADDITIONAL VESSEL  11/02/2017  . IR ANGIOGRAM SELECTIVE EACH ADDITIONAL VESSEL  11/02/2017  . IR ANGIOGRAM SELECTIVE EACH ADDITIONAL VESSEL  11/02/2017  . IR ANGIOGRAM VISCERAL SELECTIVE  10/19/2017  . IR ANGIOGRAM VISCERAL SELECTIVE  10/19/2017  . IR ANGIOGRAM VISCERAL SELECTIVE  11/02/2017  . IR EMBO ARTERIAL NOT HEMORR HEMANG INC GUIDE ROADMAPPING  10/19/2017  . IR EMBO TUMOR ORGAN ISCHEMIA INFARCT INC GUIDE ROADMAPPING  11/02/2017  . IR PARACENTESIS  12/04/2017  . IR PARACENTESIS  12/07/2017  . IR PARACENTESIS  12/10/2017  . IR PARACENTESIS  12/18/2017  . IR PARACENTESIS  12/31/2017  . IR PARACENTESIS  01/04/2018  . IR RADIOLOGIST EVAL & MGMT  09/18/2017  . IR RADIOLOGIST EVAL & MGMT  11/20/2017  . IR RADIOLOGIST EVAL & MGMT  02/05/2018  . IR US GUIDE VASC ACCESS RIGHT  10/19/2017  . IR US GUIDE VASC ACCESS RIGHT  11/02/2017     Family History  Problem Relation Age of Onset  . Migraines Mother   . Cancer Father        biological father with lung to brain   . Migraines Maternal Grandmother     Social History   Socioeconomic History  . Marital status: Married    Spouse name: Not on file  . Number of children: 0  . Years of education: Not on file  . Highest education level: Not on file  Occupational History  . Occupation: Geophysicist/field seismologist, Hydrologist    Comment: full time   Social Needs  . Financial resource strain: Not on file  . Food insecurity:    Worry: Not on file    Inability: Not on file  . Transportation needs:    Medical: Not on file    Non-medical: Not on file  Tobacco Use  . Smoking status: Current Some Day Smoker    Packs/day: 0.25    Years: 30.00    Pack years: 7.50    Types: Cigarettes  . Smokeless tobacco: Never Used  Substance and Sexual Activity  . Alcohol use: Yes    Comment: occassional 2 beers a month; never heavy use  . Drug use: No    Comment: remote h/o IV drug use, none in decades   . Sexual activity: Not Currently  Lifestyle  . Physical activity:  Days per week: Not on file    Minutes per session: Not on file  . Stress: Not on file  Relationships  . Social connections:    Talks on phone: Not on file    Gets together: Not on file    Attends religious service: Not on file    Active member of club or organization: Not on file    Attends meetings of clubs or organizations: Not on file    Relationship status: Not on file  . Intimate partner violence:    Fear of current or ex partner: Not on file    Emotionally abused: Not on file    Physically abused: Not on file    Forced sexual activity: Not on file  Other Topics Concern  . Not on file  Social History Narrative  . Not on file    Outpatient Medications Prior to Visit  Medication Sig Dispense Refill  . ALPRAZolam (XANAX) 0.25 MG tablet Take 1 tablet (0.25 mg total) by mouth at bedtime as needed for anxiety.  30 tablet 1  . calcium carbonate (TUMS - DOSED IN MG ELEMENTAL CALCIUM) 500 MG chewable tablet Chew 1 tablet by mouth daily as needed for heartburn.     . furosemide (LASIX) 20 MG tablet Take 20 mg by mouth daily. Taking 60mg  daily    . lactulose (CEPHULAC) 10 g packet Take 1 packet (10 g total) by mouth 2 (two) times daily. (Patient taking differently: Take 10 g by mouth 3 (three) times daily. 30g daily) 60 each 3  . levOCARNitine (CARNITOR) 1 GM/10ML solution Take 10 mLs by mouth daily.    . sertraline (ZOLOFT) 50 MG tablet Take 1 tablet (50 mg total) by mouth daily. 30 tablet 1  . spironolactone (ALDACTONE) 50 MG tablet Please take 3 by mouth daily for a total of 150mg  a day 90 tablet 2  . XIFAXAN 550 MG TABS tablet Take 1 tablet by mouth 2 (two) times daily.     Facility-Administered Medications Prior to Visit  Medication Dose Route Frequency Provider Last Rate Last Dose  . 0.9 %  sodium chloride infusion  500 mL Intravenous Once Nelida Meuse III, MD      . albumin human 25 % solution 25 g  25 g Intravenous Once Nelida Meuse III, MD        No Known Allergies  ROS Review of Systems    Objective:    Physical Exam  BP (!) 105/94   Pulse (!) 52   Temp 97.8 F (36.6 C) (Oral)   Resp 16   Ht 5\' 11"  (1.803 m)   SpO2 92%   BMI 26.26 kg/m  Wt Readings from Last 3 Encounters:  03/08/18 188 lb 4 oz (85.4 kg)  02/05/18 187 lb (84.8 kg)  02/01/18 187 lb 3.2 oz (84.9 kg)   General: alert, oriented, in NAD Head: normocephalic, atraumatic, no sinus tenderness Eyes: EOM intact, no scleral icterus or conjunctival injection Ears: TM clear bilaterally Nose: mucosa nonerythematous, nonedematous Throat: no pharyngeal exudate or erythema Lymph: no posterior auricular, submental or cervical lymph adenopathy Heart: normal rate, normal sinus rhythm, no murmurs Lungs: clear to auscultation bilaterally, faint crackles in the base Abdomen: very distended with fluid shift Skin:  jaundiced  IMPRESSION: 1. Mild patchy atelectasis or pneumonia at the left lung base. 2. Stable small moderate-sized left pleural effusion. 3. Mild bronchitic changes.   Electronically Signed   By: Claudie Revering M.D.   On: 03/20/2018 17:42  CLINICAL DATA:  Chronic cough.  Progressive shortness of breath.  EXAM: CHEST - 2 VIEW  COMPARISON:  Two-view chest x-ray 12/09/2017  FINDINGS: The heart size is normal. New left lower lobe airspace disease and effusion is present. There is minimal atelectasis at the right base. Upper lung fields are clear. The visualized soft tissues and bony thorax are.  IMPRESSION: 1. Left lower lobe pneumonia and small effusion. 2. Minimal atelectasis at the right base.   Electronically Signed   By: San Morelle M.D.   On: 01/04/2018 14:29   Health Maintenance Due  Topic Date Due  . MAMMOGRAM  12/17/2013  . PAP SMEAR-Modifier  11/11/2014    There are no preventive care reminders to display for this patient.  Lab Results  Component Value Date   TSH 1.562 11/11/2011   Lab Results  Component Value Date   WBC 7.2 01/06/2018   HGB 12.3 01/06/2018   HCT 34.8 (L) 01/06/2018   MCV 91.8 01/06/2018   PLT 76 (L) 01/06/2018   Lab Results  Component Value Date   NA 124 (L) 03/14/2018   K 4.6 03/14/2018   CO2 24 03/14/2018   GLUCOSE 122 (H) 03/14/2018   BUN 21 03/14/2018   CREATININE 0.86 03/14/2018   BILITOT 3.2 (H) 01/06/2018   ALKPHOS 175 (H) 01/06/2018   AST 74 (H) 01/06/2018   ALT 35 01/06/2018   PROT 5.8 (L) 01/06/2018   ALBUMIN 2.5 (L) 01/06/2018   CALCIUM 8.1 (L) 03/14/2018   ANIONGAP 10 01/06/2018   GFR 67.04 03/14/2018   Lab Results  Component Value Date   CHOL 161 11/11/2011   Lab Results  Component Value Date   HDL 57 11/11/2011   Lab Results  Component Value Date   LDLCALC 78 11/11/2011   Lab Results  Component Value Date   TRIG 131 11/11/2011   Lab Results  Component Value Date    CHOLHDL 2.8 11/11/2011   Lab Results  Component Value Date   HGBA1C 5.1 03/12/2017      Assessment & Plan:   Problem List Items Addressed This Visit      Respiratory   Community acquired pneumonia   Relevant Medications   cefdinir (OMNICEF) 300 MG capsule   cefTRIAXone (ROCEPHIN) injection 500 mg (Start on 03/20/2018  6:00 PM)    Other Visit Diagnoses    Productive cough    -  Primary   Relevant Medications   cefTRIAXone (ROCEPHIN) injection 500 mg (Start on 03/20/2018  6:00 PM)   Other Relevant Orders   DG Chest 2 View (Completed)    SOB seems more likely from her distended abdomen Left lower lobe pneumonia Will do rocephin and send omnicef  Continue follow up with specialists ER precautions reviewed  Meds ordered this encounter  Medications  . cefdinir (OMNICEF) 300 MG capsule    Sig: Take 1 capsule (300 mg total) by mouth 2 (two) times daily for 7 days.    Dispense:  14 capsule    Refill:  0  . cefTRIAXone (ROCEPHIN) injection 500 mg    Follow-up: No follow-ups on file.    Forrest Moron, MD

## 2018-03-20 NOTE — Progress Notes (Signed)
Patient has low saturations ranging from 91-88 with a productive cough that she described as green blobs.  She is short of breath.  Heide Scales assessed and decided to cancel and reschedule for after feeling better and ascites have been drawn off on Friday.   Patient left at 0745, left number to call to reschedule procedure or office visit.   [phone]

## 2018-03-20 NOTE — Progress Notes (Signed)
Pt's states no medical or surgical changes since previsit or office visit. 

## 2018-03-22 ENCOUNTER — Emergency Department (HOSPITAL_COMMUNITY)
Admission: EM | Admit: 2018-03-22 | Discharge: 2018-03-22 | Disposition: A | Discharge status: Home / Self Care | Payer: BLUE CROSS/BLUE SHIELD | Attending: Emergency Medicine | Admitting: Emergency Medicine

## 2018-03-22 ENCOUNTER — Telehealth: Payer: Self-pay

## 2018-03-22 ENCOUNTER — Other Ambulatory Visit: Payer: Self-pay

## 2018-03-22 ENCOUNTER — Encounter (HOSPITAL_COMMUNITY): Payer: Self-pay | Admitting: Physician Assistant

## 2018-03-22 ENCOUNTER — Ambulatory Visit (HOSPITAL_COMMUNITY)
Admission: RE | Admit: 2018-03-22 | Discharge: 2018-03-22 | Disposition: A | Discharge status: Home / Self Care | Payer: BLUE CROSS/BLUE SHIELD | Source: Ambulatory Visit | Attending: Gastroenterology | Admitting: Gastroenterology

## 2018-03-22 ENCOUNTER — Other Ambulatory Visit (INDEPENDENT_AMBULATORY_CARE_PROVIDER_SITE_OTHER): Payer: BLUE CROSS/BLUE SHIELD

## 2018-03-22 DIAGNOSIS — R188 Other ascites: Secondary | ICD-10-CM | POA: Insufficient documentation

## 2018-03-22 DIAGNOSIS — R0602 Shortness of breath: Secondary | ICD-10-CM | POA: Diagnosis not present

## 2018-03-22 DIAGNOSIS — F329 Major depressive disorder, single episode, unspecified: Secondary | ICD-10-CM | POA: Diagnosis not present

## 2018-03-22 DIAGNOSIS — J189 Pneumonia, unspecified organism: Secondary | ICD-10-CM | POA: Diagnosis not present

## 2018-03-22 DIAGNOSIS — Z79899 Other long term (current) drug therapy: Secondary | ICD-10-CM | POA: Diagnosis not present

## 2018-03-22 DIAGNOSIS — E871 Hypo-osmolality and hyponatremia: Secondary | ICD-10-CM | POA: Diagnosis not present

## 2018-03-22 DIAGNOSIS — F17210 Nicotine dependence, cigarettes, uncomplicated: Secondary | ICD-10-CM | POA: Diagnosis not present

## 2018-03-22 HISTORY — PX: IR PARACENTESIS: IMG2679

## 2018-03-22 LAB — CBC WITH DIFFERENTIAL/PLATELET
ABS IMMATURE GRANULOCYTES: 0.3 10*3/uL — AB (ref 0.00–0.07)
Basophils Absolute: 0.1 10*3/uL (ref 0.0–0.1)
Basophils Relative: 1 %
Eosinophils Absolute: 0.1 10*3/uL (ref 0.0–0.5)
Eosinophils Relative: 1 %
HCT: 33.3 % — ABNORMAL LOW (ref 36.0–46.0)
Hemoglobin: 11.6 g/dL — ABNORMAL LOW (ref 12.0–15.0)
Immature Granulocytes: 3 %
Lymphocytes Relative: 5 %
Lymphs Abs: 0.4 10*3/uL — ABNORMAL LOW (ref 0.7–4.0)
MCH: 33.8 pg (ref 26.0–34.0)
MCHC: 34.8 g/dL (ref 30.0–36.0)
MCV: 97.1 fL (ref 80.0–100.0)
Monocytes Absolute: 1.6 10*3/uL — ABNORMAL HIGH (ref 0.1–1.0)
Monocytes Relative: 16 %
NEUTROS ABS: 7.3 10*3/uL (ref 1.7–7.7)
NEUTROS PCT: 74 %
Platelets: 82 10*3/uL — ABNORMAL LOW (ref 150–400)
RBC: 3.43 MIL/uL — AB (ref 3.87–5.11)
RDW: 16.6 % — ABNORMAL HIGH (ref 11.5–15.5)
WBC: 9.7 10*3/uL (ref 4.0–10.5)
nRBC: 0.2 % (ref 0.0–0.2)

## 2018-03-22 LAB — COMPREHENSIVE METABOLIC PANEL
ALT: 86 U/L — ABNORMAL HIGH (ref 0–44)
AST: 122 U/L — ABNORMAL HIGH (ref 15–41)
Albumin: 2.1 g/dL — ABNORMAL LOW (ref 3.5–5.0)
Alkaline Phosphatase: 146 U/L — ABNORMAL HIGH (ref 38–126)
Anion gap: 8 (ref 5–15)
BUN: 28 mg/dL — ABNORMAL HIGH (ref 8–23)
CHLORIDE: 94 mmol/L — AB (ref 98–111)
CO2: 20 mmol/L — ABNORMAL LOW (ref 22–32)
Calcium: 8 mg/dL — ABNORMAL LOW (ref 8.9–10.3)
Creatinine, Ser: 1 mg/dL (ref 0.44–1.00)
GFR calc Af Amer: >60 mL/min (ref >60)
GFR calc non Af Amer: >60 mL/min (ref >60)
Glucose, Bld: 157 mg/dL — ABNORMAL HIGH (ref 70–99)
Potassium: 4.3 mmol/L (ref 3.5–5.1)
Sodium: 122 mmol/L — ABNORMAL LOW (ref 135–145)
Total Bilirubin: 8.2 mg/dL — ABNORMAL HIGH (ref 0.3–1.2)
Total Protein: 5.9 g/dL — ABNORMAL LOW (ref 6.5–8.1)

## 2018-03-22 LAB — PROTIME-INR
INR: 2.2 — AB (ref 0.8–1.2)
Prothrombin Time: 23.7 seconds — ABNORMAL HIGH (ref 11.4–15.2)

## 2018-03-22 LAB — BASIC METABOLIC PANEL
BUN: 27 mg/dL — ABNORMAL HIGH (ref 6–23)
CO2: 23 mEq/L (ref 19–32)
Calcium: 8.3 mg/dL — ABNORMAL LOW (ref 8.4–10.5)
Chloride: 91 mEq/L — ABNORMAL LOW (ref 96–112)
Creatinine, Ser: 1 mg/dL (ref 0.40–1.20)
GFR: 56.32 mL/min — ABNORMAL LOW (ref >60.00)
Glucose, Bld: 112 mg/dL — ABNORMAL HIGH (ref 70–99)
Potassium: 4.8 mEq/L (ref 3.5–5.1)
SODIUM: 119 meq/L — AB (ref 135–145)

## 2018-03-22 MED ORDER — ALBUMIN HUMAN 25 % IV SOLN
25.0000 g | Freq: Once | INTRAVENOUS | Status: DC
  Filled 2018-03-22: qty 100

## 2018-03-22 MED ORDER — LIDOCAINE HCL 1 % IJ SOLN
INTRAMUSCULAR | Status: AC
  Filled 2018-03-22: qty 20

## 2018-03-22 MED ORDER — LIDOCAINE HCL (PF) 1 % IJ SOLN
INTRAMUSCULAR | Status: AC | PRN
  Administered 2018-03-22: 10 mL

## 2018-03-22 MED ORDER — SODIUM CHLORIDE 0.9 % IV BOLUS
500.0000 mL | Freq: Once | INTRAVENOUS | Status: AC
  Administered 2018-03-22: 500 mL via INTRAVENOUS

## 2018-03-22 NOTE — Telephone Encounter (Signed)
She has chronic hyponatremia but today's value is lower than previous. Can you clarify how much aldactone and lasix she is taking and how she is feeling? She should be on 1.5L fluid restricted diet. If she is not feeling well given this level of hyponatremia she may need to go to the ED. Can you let me know? Thanks

## 2018-03-22 NOTE — Telephone Encounter (Signed)
Renee Rice given the severity of her hyponatremia I think she needs to go to the ED for management and likely admission, especially if she is tired and sleepy. Difficult situation with her diuretics / ascites, but these need to be adjusted. Can you please contact her and have her go to the hospital. thanks

## 2018-03-22 NOTE — Procedures (Signed)
PROCEDURE SUMMARY:  Successful image-guided paracentesis from the left lower abdomen.  Yielded 3.8 liters of hazy yellow fluid.  No immediate complications.  EBL: zero Patient tolerated well.   Specimen was not sent for labs.  Please see imaging section of Epic for full dictation.  Joaquim Nam PA-C 03/22/2018 10:55 AM

## 2018-03-22 NOTE — ED Triage Notes (Signed)
Pt was called by hospital after blood work had sodium of 119. Pt had paracentesis this morning.

## 2018-03-22 NOTE — Telephone Encounter (Signed)
Danis patient had labs today, received call report that her sodium is 119 today. Please advise as dod.

## 2018-03-22 NOTE — Telephone Encounter (Signed)
Spoke with pts husband, he is aware and knows to take her to the ER and she may be admitted.

## 2018-03-22 NOTE — Telephone Encounter (Signed)
Spoke with pt and she states she feels better today, she had 3.8 liters of fluid drawn off today at paracentesis. Reports she can breathe now. She states she is and has been tired and sleepy. She is currently taking omnicef for URI. Her lasix she is taking 80mg  daily, aldactone 200mg  daily. Please advise.

## 2018-03-22 NOTE — ED Provider Notes (Addendum)
[facility] DEPT Provider Note   CSN: [id number] Arrival date & time: 03/22/18  1520    History   Chief Complaint Chief Complaint  Patient presents with  . Abnormal Lab    sodium 119    HPI Renee Rice is a 63 y.o. female.     63 yo F with chief complaints of hyponatremia.  Patient states that she has been sick recently cough congestion has been diagnosed with bronchitis and pneumonia and has been started on antibiotics.  She is unsure which when she was started on.  Has continued to feel unwell.  Was seen by her GI doctor yesterday and was supposed to have an endoscopy but due to her illness was pushed off.  Lab work was drawn which shows that she has worsening hyponatremia.  She was then called back and sent here for evaluation and likely admission.  The history is provided by the patient and the spouse.  Abnormal Lab  Illness  Severity:  Moderate Onset quality:  Sudden Duration:  1 week Timing:  Constant Progression:  Worsening Chronicity:  New Associated symptoms: congestion, cough and shortness of breath   Associated symptoms: no chest pain, no fever, no headaches, no myalgias, no nausea, no rhinorrhea, no vomiting and no wheezing     Past Medical History:  Diagnosis Date  . Allergic rhinitis   . Chronic viral hepatitis C ([place]) 04/27/2017   with cirrhosis  . Depression    in the past, no meds, no previous hospitalization   . Heart murmur     Patient Active Problem List   Diagnosis Date Noted  . Other cirrhosis of liver ([place])   . Hepatocellular carcinoma ([place])   . Hyponatremia   . Severe comorbid illness   . Tobacco use disorder   . Community acquired pneumonia 01/05/2018  . Ascites 12/09/2017  . Fibrosis of liver 05/17/2017  . Chronic viral hepatitis C ([place]) 04/27/2017  . Cigarette smoker 02/10/2017    Past Surgical History:  Procedure Laterality Date  . IR ANGIOGRAM SELECTIVE EACH ADDITIONAL VESSEL  10/19/2017  . IR  ANGIOGRAM SELECTIVE EACH ADDITIONAL VESSEL  10/19/2017  . IR ANGIOGRAM SELECTIVE EACH ADDITIONAL VESSEL  10/19/2017  . IR ANGIOGRAM SELECTIVE EACH ADDITIONAL VESSEL  10/19/2017  . IR ANGIOGRAM SELECTIVE EACH ADDITIONAL VESSEL  11/02/2017  . IR ANGIOGRAM SELECTIVE EACH ADDITIONAL VESSEL  11/02/2017  . IR ANGIOGRAM SELECTIVE EACH ADDITIONAL VESSEL  11/02/2017  . IR ANGIOGRAM VISCERAL SELECTIVE  10/19/2017  . IR ANGIOGRAM VISCERAL SELECTIVE  10/19/2017  . IR ANGIOGRAM VISCERAL SELECTIVE  11/02/2017  . IR EMBO ARTERIAL NOT HEMORR HEMANG INC GUIDE ROADMAPPING  10/19/2017  . IR EMBO TUMOR ORGAN ISCHEMIA INFARCT INC GUIDE ROADMAPPING  11/02/2017  . IR PARACENTESIS  12/04/2017  . IR PARACENTESIS  12/07/2017  . IR PARACENTESIS  12/10/2017  . IR PARACENTESIS  12/18/2017  . IR PARACENTESIS  12/31/2017  . IR PARACENTESIS  01/04/2018  . IR PARACENTESIS  03/22/2018  . IR RADIOLOGIST EVAL & MGMT  09/18/2017  . IR RADIOLOGIST EVAL & MGMT  11/20/2017  . IR RADIOLOGIST EVAL & MGMT  02/05/2018  . IR US GUIDE VASC ACCESS RIGHT  10/19/2017  . IR US GUIDE VASC ACCESS RIGHT  11/02/2017     OB History   No obstetric history on file.      Home Medications    Prior to Admission medications   Medication Sig Start Date End Date Taking? Authorizing Provider  ALPRAZolam Duanne Moron) 0.25  MG tablet Take 1 tablet (0.25 mg total) by mouth at bedtime as needed for anxiety. 02/25/18  Yes Truitt Merle, MD  calcium carbonate (TUMS - DOSED IN MG ELEMENTAL CALCIUM) 500 MG chewable tablet Chew 1 tablet by mouth daily as needed for heartburn.    Yes [provider]  cefdinir (OMNICEF) 300 MG capsule Take 1 capsule (300 mg total) by mouth 2 (two) times daily for 7 days. 03/20/18 03/27/18 Yes Stallings, Zoe A, MD  furosemide (LASIX) 20 MG tablet Take 80 mg by mouth daily.    Yes [provider]  lactulose (CEPHULAC) 10 g packet Take 1 packet (10 g total) by mouth 2 (two) times daily. Patient taking differently: Take 10 g by  mouth 3 (three) times daily.  01/01/18  Yes Danis, Kirke Corin, MD  naproxen sodium (ALEVE) 220 MG tablet Take 220 mg by mouth daily as needed (pain).   Yes [provider]  sertraline (ZOLOFT) 50 MG tablet Take 1 tablet (50 mg total) by mouth daily. 01/11/18  Yes Truitt Merle, MD  spironolactone (ALDACTONE) 50 MG tablet Please take 3 by mouth daily for a total of 150mg  a day Patient taking differently: Take 200 mg by mouth daily.  02/12/18  Yes Danis, Kirke Corin, MD  traMADol (ULTRAM) 50 MG tablet Take 50 mg by mouth daily as needed for moderate pain.   Yes [provider]  XIFAXAN 550 MG TABS tablet Take 1 tablet by mouth 2 (two) times daily. 02/26/18  Yes [provider]    Family History Family History  Problem Relation Age of Onset  . Migraines Mother   . Cancer Father        biological father with lung to brain   . Migraines Maternal Grandmother     Social History Social History   Tobacco Use  . Smoking status: Current Some Day Smoker    Packs/day: 0.25    Years: 30.00    Pack years: 7.50    Types: Cigarettes  . Smokeless tobacco: Never Used  Substance Use Topics  . Alcohol use: Yes    Comment: occassional 2 beers a month; never heavy use  . Drug use: No    Comment: remote h/o IV drug use, none in decades      Allergies   Patient has no known allergies.   Review of Systems Review of Systems  Constitutional: Negative for chills and fever.  HENT: Positive for congestion. Negative for rhinorrhea.   Eyes: Negative for redness and visual disturbance.  Respiratory: Positive for cough and shortness of breath. Negative for wheezing.   Cardiovascular: Negative for chest pain and palpitations.  Gastrointestinal: Negative for nausea and vomiting.  Genitourinary: Negative for dysuria and urgency.  Musculoskeletal: Negative for arthralgias and myalgias.  Skin: Negative for pallor and wound.  Neurological: Negative for dizziness and headaches.      Physical Exam Updated Vital Signs BP 103/73   Pulse 93   Temp 97.9 F (36.6 C) (Oral)   Resp 18   SpO2 92%   Physical Exam Vitals signs and nursing note reviewed.  Constitutional:      General: She is not in acute distress.    Appearance: She is well-developed. She is ill-appearing. She is not diaphoretic.  HENT:     Head: Normocephalic and atraumatic.  Eyes:     Pupils: Pupils are equal, round, and reactive to light.  Neck:     Musculoskeletal: Normal range of motion and neck supple.  Cardiovascular:     Rate and Rhythm: Normal rate and regular rhythm.     Heart sounds: No murmur. No friction rub. No gallop.   Pulmonary:     Effort: Pulmonary effort is normal.     Breath sounds: No wheezing or rales.  Abdominal:     General: There is distension.     Palpations: Abdomen is soft.     Tenderness: There is no abdominal tenderness.  Musculoskeletal:        General: No tenderness.  Skin:    General: Skin is warm and dry.  Neurological:     Mental Status: She is alert and oriented to person, place, and time.  Psychiatric:        Behavior: Behavior normal.      ED Treatments / Results  Labs (all labs ordered are listed, but only abnormal results are displayed) Labs Reviewed  CBC WITH DIFFERENTIAL/PLATELET - Abnormal; Notable for the following components:      Result Value   RBC 3.43 (*)    Hemoglobin 11.6 (*)    HCT 33.3 (*)    RDW 16.6 (*)    Platelets 82 (*)    Lymphs Abs 0.4 (*)    Monocytes Absolute 1.6 (*)    Abs Immature Granulocytes 0.30 (*)    All other components within normal limits  COMPREHENSIVE METABOLIC PANEL - Abnormal; Notable for the following components:   Sodium 122 (*)    Chloride 94 (*)    CO2 20 (*)    Glucose, Bld 157 (*)    BUN 28 (*)    Calcium 8.0 (*)    Total Protein 5.9 (*)    Albumin 2.1 (*)    AST 122 (*)    ALT 86 (*)    Alkaline Phosphatase 146 (*)    Total Bilirubin 8.2 (*)    All other components within normal  limits  PROTIME-INR - Abnormal; Notable for the following components:   Prothrombin Time 23.7 (*)    INR 2.2 (*)    All other components within normal limits    EKG None  Radiology Ir Paracentesis  Result Date: 03/22/2018 INDICATION: Patient with history of hepatitis C, cirrhosis, HCC with prior hepatic Y-90 radioembolization and recurrent ascites. Request for therapeutic paracentesis in IR. EXAM: ULTRASOUND GUIDED THERAPEUTIC PARACENTESIS MEDICATIONS: 10 mL 1% lidocaine. COMPLICATIONS: None immediate. PROCEDURE: Informed written consent was obtained from the patient after a discussion of the risks, benefits and alternatives to treatment. A timeout was performed prior to the initiation of the procedure. Initial ultrasound scanning demonstrates a moderate amount of ascites within the left lower abdominal quadrant. The left lower abdomen was prepped and draped in the usual sterile fashion. 1% lidocaine was used for local anesthesia. Following this, a 19 gauge, 7-cm, Yueh catheter was introduced. An ultrasound image was saved for documentation purposes. The paracentesis was performed. The catheter was removed and a dressing was applied. The patient tolerated the procedure well without immediate post procedural complication. FINDINGS: A total of approximately 3.8 L of hazy yellow fluid was removed. IMPRESSION: Successful ultrasound-guided paracentesis yielding 3.8 liters of peritoneal fluid. Read by Candiss Norse, PA-C Electronically Signed   By: Jerilynn Mages.  Shick M.D.   On: 03/22/2018 11:00    Procedures Procedures (including critical care time) EJ placement: 18 gauge IV placed in L EJ. Skin prepped with alcohol pads, L EJ identified with Valsalva. Cannulated with good return of dark, non-pulsatile blood. Tachyderm placed after easily flushed with NS.  Medications Ordered in ED Medications  sodium chloride 0.9 % bolus 500 mL (0 mLs Intravenous Stopped 03/22/18 1748)     Initial Impression /  Assessment and Plan / ED Course  I have reviewed the triage vital signs and the nursing notes.  Pertinent labs & imaging results that were available during my care of the patient were reviewed by me and considered in my medical decision making (see chart for details).        63 yo F with a chief complaint of fatigue productive cough and hyponatremia.  No confusion or mental status change, patient is a cirrhotic had a sodium of 119 done in the office.  Clinically the patient appears dehydrated will give a bolus of IV fluids.  Likely with her pneumonia and hyponatremia will need admission for electrolyte management.  She is on Omnicef as an outpatient.  I discussed the case with Dr. Henrene Pastor, gastroenterology.  He had independently reviewed the patient's chart, I discussed the patient's presentation.  He felt that if she was not doing well at home that admission was warranted.  I went back and discussed this with the patient who stated that she would rather go home.  I will have her hold her diuretics for the next couple days this was discussed with Dr. Henrene Pastor.  She will follow-up with her GI doctor or her family doctor early next week for sodium recheck.  6:12 PM:  I have discussed the diagnosis/risks/treatment options with the patient and family and believe the pt to be eligible for discharge home to follow-up with PCP. We also discussed returning to the ED immediately if new or worsening sx occur. We discussed the sx which are most concerning (e.g., sudden worsening pain, fever, inability to tolerate by mouth) that necessitate immediate return. Medications administered to the patient during their visit and any new prescriptions provided to the patient are listed below.  Medications given during this visit Medications  sodium chloride 0.9 % bolus 500 mL (0 mLs Intravenous Stopped 03/22/18 1748)     The patient appears reasonably screen and/or stabilized for discharge and I doubt any other medical  condition or other [facility] requiring further screening, evaluation, or treatment in the ED at this time prior to discharge.    Final Clinical Impressions(s) / ED Diagnoses   Final diagnoses:  Hyponatremia  Community acquired pneumonia, unspecified laterality    ED Discharge Orders    None       Deno Etienne, DO 03/22/18 [place], DO 03/22/18 1812

## 2018-03-22 NOTE — Discharge Instructions (Signed)
Hold your diuretics for the next couple days.  Return to the emergency apartment for any worsening symptoms.  Otherwise see your family doctor or GI doctor on Monday to have your labs rechecked to see what your sodium level is.

## 2018-03-22 NOTE — ED Notes (Addendum)
Attempted IV start x3, unsuccessful.  Will request other staff to assist with IV placement.

## 2018-03-22 NOTE — ED Notes (Signed)
EJ IV removed, pressure applied to site for appx 3 minutes with clean gauze.  No bleeding noted.  Clean gauze applied and dressed with tegaderm.  Pt d/c via wheelchair with husband.  No questions or concerns noted at this time.

## 2018-03-25 ENCOUNTER — Telehealth: Payer: Self-pay

## 2018-03-25 ENCOUNTER — Other Ambulatory Visit (INDEPENDENT_AMBULATORY_CARE_PROVIDER_SITE_OTHER): Payer: BLUE CROSS/BLUE SHIELD

## 2018-03-25 ENCOUNTER — Other Ambulatory Visit: Payer: Self-pay

## 2018-03-25 ENCOUNTER — Encounter (HOSPITAL_COMMUNITY): Payer: Self-pay | Admitting: Emergency Medicine

## 2018-03-25 ENCOUNTER — Inpatient Hospital Stay (HOSPITAL_COMMUNITY)
Admission: EM | Admit: 2018-03-25 | Discharge: 2018-04-01 | DRG: 640 | Disposition: A | Discharge status: Hospice | Payer: BLUE CROSS/BLUE SHIELD | Source: Ambulatory Visit | Attending: Internal Medicine | Admitting: Internal Medicine

## 2018-03-25 ENCOUNTER — Telehealth: Payer: Self-pay | Admitting: Gastroenterology

## 2018-03-25 ENCOUNTER — Emergency Department (HOSPITAL_COMMUNITY): Payer: BLUE CROSS/BLUE SHIELD

## 2018-03-25 DIAGNOSIS — Z66 Do not resuscitate: Secondary | ICD-10-CM | POA: Diagnosis not present

## 2018-03-25 DIAGNOSIS — F17210 Nicotine dependence, cigarettes, uncomplicated: Secondary | ICD-10-CM | POA: Diagnosis present

## 2018-03-25 DIAGNOSIS — Z79899 Other long term (current) drug therapy: Secondary | ICD-10-CM | POA: Diagnosis not present

## 2018-03-25 DIAGNOSIS — C220 Liver cell carcinoma: Secondary | ICD-10-CM | POA: Diagnosis not present

## 2018-03-25 DIAGNOSIS — J181 Lobar pneumonia, unspecified organism: Secondary | ICD-10-CM

## 2018-03-25 DIAGNOSIS — Z515 Encounter for palliative care: Secondary | ICD-10-CM | POA: Diagnosis not present

## 2018-03-25 DIAGNOSIS — R188 Other ascites: Secondary | ICD-10-CM | POA: Diagnosis present

## 2018-03-25 DIAGNOSIS — K7200 Acute and subacute hepatic failure without coma: Secondary | ICD-10-CM | POA: Diagnosis not present

## 2018-03-25 DIAGNOSIS — Z7189 Other specified counseling: Secondary | ICD-10-CM

## 2018-03-25 DIAGNOSIS — R042 Hemoptysis: Secondary | ICD-10-CM | POA: Diagnosis not present

## 2018-03-25 DIAGNOSIS — F419 Anxiety disorder, unspecified: Secondary | ICD-10-CM | POA: Diagnosis present

## 2018-03-25 DIAGNOSIS — R0602 Shortness of breath: Secondary | ICD-10-CM | POA: Diagnosis not present

## 2018-03-25 DIAGNOSIS — Z808 Family history of malignant neoplasm of other organs or systems: Secondary | ICD-10-CM

## 2018-03-25 DIAGNOSIS — J189 Pneumonia, unspecified organism: Secondary | ICD-10-CM | POA: Diagnosis present

## 2018-03-25 DIAGNOSIS — D631 Anemia in chronic kidney disease: Secondary | ICD-10-CM | POA: Diagnosis not present

## 2018-03-25 DIAGNOSIS — B182 Chronic viral hepatitis C: Secondary | ICD-10-CM | POA: Diagnosis not present

## 2018-03-25 DIAGNOSIS — F329 Major depressive disorder, single episode, unspecified: Secondary | ICD-10-CM | POA: Diagnosis present

## 2018-03-25 DIAGNOSIS — Z801 Family history of malignant neoplasm of trachea, bronchus and lung: Secondary | ICD-10-CM | POA: Diagnosis not present

## 2018-03-25 DIAGNOSIS — R946 Abnormal results of thyroid function studies: Secondary | ICD-10-CM | POA: Diagnosis present

## 2018-03-25 DIAGNOSIS — N179 Acute kidney failure, unspecified: Secondary | ICD-10-CM | POA: Diagnosis not present

## 2018-03-25 DIAGNOSIS — E875 Hyperkalemia: Secondary | ICD-10-CM | POA: Diagnosis present

## 2018-03-25 DIAGNOSIS — E871 Hypo-osmolality and hyponatremia: Secondary | ICD-10-CM

## 2018-03-25 DIAGNOSIS — J918 Pleural effusion in other conditions classified elsewhere: Secondary | ICD-10-CM | POA: Diagnosis not present

## 2018-03-25 DIAGNOSIS — K7469 Other cirrhosis of liver: Secondary | ICD-10-CM | POA: Diagnosis present

## 2018-03-25 DIAGNOSIS — E861 Hypovolemia: Secondary | ICD-10-CM | POA: Diagnosis present

## 2018-03-25 DIAGNOSIS — F339 Major depressive disorder, recurrent, unspecified: Secondary | ICD-10-CM

## 2018-03-25 DIAGNOSIS — K767 Hepatorenal syndrome: Secondary | ICD-10-CM | POA: Diagnosis present

## 2018-03-25 DIAGNOSIS — I8510 Secondary esophageal varices without bleeding: Secondary | ICD-10-CM | POA: Diagnosis present

## 2018-03-25 DIAGNOSIS — K7460 Unspecified cirrhosis of liver: Secondary | ICD-10-CM

## 2018-03-25 DIAGNOSIS — N183 Chronic kidney disease, stage 3 (moderate): Secondary | ICD-10-CM | POA: Diagnosis present

## 2018-03-25 DIAGNOSIS — R079 Chest pain, unspecified: Secondary | ICD-10-CM | POA: Diagnosis not present

## 2018-03-25 DIAGNOSIS — N189 Chronic kidney disease, unspecified: Secondary | ICD-10-CM | POA: Diagnosis not present

## 2018-03-25 DIAGNOSIS — Z923 Personal history of irradiation: Secondary | ICD-10-CM | POA: Diagnosis not present

## 2018-03-25 DIAGNOSIS — Z820 Family history of epilepsy and other diseases of the nervous system: Secondary | ICD-10-CM

## 2018-03-25 DIAGNOSIS — J90 Pleural effusion, not elsewhere classified: Secondary | ICD-10-CM | POA: Diagnosis present

## 2018-03-25 DIAGNOSIS — R05 Cough: Secondary | ICD-10-CM | POA: Diagnosis not present

## 2018-03-25 DIAGNOSIS — R531 Weakness: Secondary | ICD-10-CM | POA: Diagnosis not present

## 2018-03-25 LAB — COMPREHENSIVE METABOLIC PANEL
ALK PHOS: 142 U/L — AB (ref 38–126)
ALT: 87 U/L — ABNORMAL HIGH (ref 0–44)
AST: 125 U/L — ABNORMAL HIGH (ref 15–41)
Albumin: 2 g/dL — ABNORMAL LOW (ref 3.5–5.0)
Anion gap: 8 (ref 5–15)
BUN: 29 mg/dL — ABNORMAL HIGH (ref 8–23)
CO2: 17 mmol/L — ABNORMAL LOW (ref 22–32)
Calcium: 8.2 mg/dL — ABNORMAL LOW (ref 8.9–10.3)
Chloride: 92 mmol/L — ABNORMAL LOW (ref 98–111)
Creatinine, Ser: 1.18 mg/dL — ABNORMAL HIGH (ref 0.44–1.00)
GFR calc Af Amer: 58 mL/min — ABNORMAL LOW (ref >60)
GFR calc non Af Amer: 50 mL/min — ABNORMAL LOW (ref >60)
Glucose, Bld: 152 mg/dL — ABNORMAL HIGH (ref 70–99)
Potassium: 5 mmol/L (ref 3.5–5.1)
SODIUM: 117 mmol/L — AB (ref 135–145)
Total Bilirubin: 8.7 mg/dL — ABNORMAL HIGH (ref 0.3–1.2)
Total Protein: 6 g/dL — ABNORMAL LOW (ref 6.5–8.1)

## 2018-03-25 LAB — CBC
HCT: 34.3 % — ABNORMAL LOW (ref 36.0–46.0)
Hemoglobin: 12.4 g/dL (ref 12.0–15.0)
MCH: 33.7 pg (ref 26.0–34.0)
MCHC: 36.2 g/dL — ABNORMAL HIGH (ref 30.0–36.0)
MCV: 93.2 fL (ref 80.0–100.0)
Platelets: 76 10*3/uL — ABNORMAL LOW (ref 150–400)
RBC: 3.68 MIL/uL — ABNORMAL LOW (ref 3.87–5.11)
RDW: 16.9 % — ABNORMAL HIGH (ref 11.5–15.5)
WBC: 11.4 10*3/uL — ABNORMAL HIGH (ref 4.0–10.5)
nRBC: 0.2 % (ref 0.0–0.2)

## 2018-03-25 LAB — URINALYSIS, ROUTINE W REFLEX MICROSCOPIC
Bacteria, UA: NONE SEEN
Bilirubin Urine: NEGATIVE
GLUCOSE, UA: NEGATIVE mg/dL
Ketones, ur: NEGATIVE mg/dL
NITRITE: NEGATIVE
Protein, ur: NEGATIVE mg/dL
Specific Gravity, Urine: 1.006 (ref 1.005–1.030)
pH: 7 (ref 5.0–8.0)

## 2018-03-25 LAB — BASIC METABOLIC PANEL
BUN: 32 mg/dL — ABNORMAL HIGH (ref 6–23)
CO2: 22 mEq/L (ref 19–32)
Calcium: 8.4 mg/dL (ref 8.4–10.5)
Chloride: 90 mEq/L — ABNORMAL LOW (ref 96–112)
Creatinine, Ser: 1.15 mg/dL (ref 0.40–1.20)
GFR: 47.93 mL/min — AB (ref >60.00)
Glucose, Bld: 136 mg/dL — ABNORMAL HIGH (ref 70–99)
Potassium: 5.4 mEq/L — ABNORMAL HIGH (ref 3.5–5.1)
Sodium: 118 mEq/L — CL (ref 135–145)

## 2018-03-25 LAB — OSMOLALITY, URINE: Osmolality, Ur: 219 mOsm/kg — ABNORMAL LOW (ref 300–900)

## 2018-03-25 LAB — I-STAT TROPONIN, ED: Troponin i, poc: 0.02 ng/mL (ref 0.00–0.08)

## 2018-03-25 LAB — OSMOLALITY: Osmolality: 262 mOsm/kg — ABNORMAL LOW (ref 275–295)

## 2018-03-25 LAB — TSH: TSH: 7.738 u[IU]/mL — ABNORMAL HIGH (ref 0.350–4.500)

## 2018-03-25 LAB — INFLUENZA PANEL BY PCR (TYPE A & B)
Influenza A By PCR: NEGATIVE
Influenza B By PCR: NEGATIVE

## 2018-03-25 LAB — SODIUM, URINE, RANDOM: Sodium, Ur: 54 mmol/L

## 2018-03-25 MED ORDER — SODIUM CHLORIDE 0.9 % IV SOLN
Freq: Once | INTRAVENOUS | Status: AC
  Administered 2018-03-25: 19:00:00 via INTRAVENOUS

## 2018-03-25 MED ORDER — SPIRONOLACTONE 100 MG PO TABS
100.0000 mg | ORAL_TABLET | Freq: Every day | ORAL | Status: DC
  Administered 2018-03-25 – 2018-03-26 (×2): 100 mg via ORAL
  Filled 2018-03-25: qty 4
  Filled 2018-03-25: qty 1
  Filled 2018-03-25: qty 4
  Filled 2018-03-25: qty 1

## 2018-03-25 MED ORDER — ACETAMINOPHEN 650 MG RE SUPP: 650.0000 mg | Freq: Four times a day (QID) | RECTAL | Status: DC | PRN

## 2018-03-25 MED ORDER — SERTRALINE HCL 50 MG PO TABS
50.0000 mg | ORAL_TABLET | Freq: Every day | ORAL | Status: DC
  Administered 2018-03-26: 50 mg via ORAL

## 2018-03-25 MED ORDER — RIFAXIMIN 550 MG PO TABS
550.0000 mg | ORAL_TABLET | Freq: Two times a day (BID) | ORAL | Status: DC
  Administered 2018-03-25 – 2018-03-31 (×12): 550 mg via ORAL
  Filled 2018-03-25 (×12): qty 1

## 2018-03-25 MED ORDER — LACTULOSE 10 GM/15ML PO SOLN
20.0000 g | Freq: Three times a day (TID) | ORAL | Status: DC
  Administered 2018-03-25 – 2018-03-28 (×10): 20 g via ORAL
  Filled 2018-03-25 (×10): qty 30

## 2018-03-25 MED ORDER — SODIUM CHLORIDE 0.9% FLUSH
3.0000 mL | Freq: Once | INTRAVENOUS | Status: AC
  Administered 2018-03-26: 3 mL via INTRAVENOUS

## 2018-03-25 MED ORDER — BOOST PLUS PO LIQD
237.0000 mL | Freq: Two times a day (BID) | ORAL | Status: DC
  Administered 2018-03-26 – 2018-03-31 (×11): 237 mL via ORAL
  Filled 2018-03-25 (×14): qty 237

## 2018-03-25 MED ORDER — ALPRAZOLAM 0.25 MG PO TABS
0.2500 mg | ORAL_TABLET | Freq: Every evening | ORAL | Status: DC | PRN
  Administered 2018-03-25 – 2018-03-26 (×2): 0.25 mg via ORAL
  Filled 2018-03-25 (×2): qty 1

## 2018-03-25 MED ORDER — ONDANSETRON HCL 4 MG PO TABS: 4.0000 mg | ORAL_TABLET | Freq: Four times a day (QID) | ORAL | Status: DC | PRN

## 2018-03-25 MED ORDER — ONDANSETRON HCL 4 MG/2ML IJ SOLN
4.0000 mg | Freq: Four times a day (QID) | INTRAMUSCULAR | Status: DC | PRN
  Administered 2018-03-25 – 2018-03-29 (×2): 4 mg via INTRAVENOUS
  Filled 2018-03-25 (×2): qty 2

## 2018-03-25 MED ORDER — ACETAMINOPHEN 325 MG PO TABS: 650.0000 mg | ORAL_TABLET | Freq: Four times a day (QID) | ORAL | Status: DC | PRN

## 2018-03-25 MED ORDER — SODIUM CHLORIDE 0.9 % IV SOLN
1.0000 g | Freq: Once | INTRAVENOUS | Status: AC
  Administered 2018-03-25: 1 g via INTRAVENOUS
  Filled 2018-03-25: qty 10

## 2018-03-25 MED ORDER — SODIUM CHLORIDE 0.9 % IV SOLN
500.0000 mg | Freq: Once | INTRAVENOUS | Status: AC
  Administered 2018-03-25: 500 mg via INTRAVENOUS
  Filled 2018-03-25: qty 500

## 2018-03-25 MED ORDER — CALCIUM CARBONATE ANTACID 500 MG PO CHEW
1.0000 | CHEWABLE_TABLET | Freq: Every day | ORAL | Status: DC | PRN
  Administered 2018-03-27: 200 mg via ORAL
  Filled 2018-03-25: qty 1

## 2018-03-25 MED ORDER — FUROSEMIDE 10 MG/ML IJ SOLN
40.0000 mg | Freq: Every day | INTRAMUSCULAR | Status: DC
  Administered 2018-03-25 – 2018-03-26 (×2): 40 mg via INTRAVENOUS
  Filled 2018-03-25 (×2): qty 4

## 2018-03-25 NOTE — Telephone Encounter (Signed)
Discussed with pts husband that she can come here for bloodwork, order in epic.

## 2018-03-25 NOTE — ED Provider Notes (Signed)
[facility] EMERGENCY DEPARTMENT Provider Note   CSN: [id number] Arrival date & time: 03/25/18  1442    History   Chief Complaint Chief Complaint  Patient presents with  . Abnormal Lab    HPI Renee Rice is a 63 y.o. female with history of chronic viral hep C with cirrhosis and hepatocellular carcinoma who presents with a one-week history of cough, congestion, and generalized weakness.  She has had some associated shortness of breath.  She was diagnosed with pneumonia at her PCP on 03/20/2018 and started on Cefdinir.  Her sodium has been monitored throughout the weekend and was seen at [facility] a few days ago, but she decided she wanted to go home.  She continues to not feel well.  She has had some left-sided chest pain as well as intermittent left lower abdominal pain, however he states that this is the same type of pain she gets when she has ascites and abdominal distention.  She had a paracentesis on Friday (3 days ago) and she states her abdominal distention is back.  She reports she normally lasts a good while longer than this.  She denies any fevers at home.  She denies any vomiting, diarrhea, urinary symptoms.     HPI  Past Medical History:  Diagnosis Date  . Allergic rhinitis   . Chronic viral hepatitis C ([place]) 04/27/2017   with cirrhosis  . Depression    in the past, no meds, no previous hospitalization   . Heart murmur     Patient Active Problem List   Diagnosis Date Noted  . Other cirrhosis of liver ([place])   . Hepatocellular carcinoma ([place])   . Hyponatremia   . Severe comorbid illness   . Tobacco use disorder   . Community acquired pneumonia 01/05/2018  . Ascites 12/09/2017  . Fibrosis of liver 05/17/2017  . Chronic viral hepatitis C ([place]) 04/27/2017  . Cigarette smoker 02/10/2017    Past Surgical History:  Procedure Laterality Date  . IR ANGIOGRAM SELECTIVE EACH ADDITIONAL VESSEL  10/19/2017  . IR ANGIOGRAM SELECTIVE EACH ADDITIONAL  VESSEL  10/19/2017  . IR ANGIOGRAM SELECTIVE EACH ADDITIONAL VESSEL  10/19/2017  . IR ANGIOGRAM SELECTIVE EACH ADDITIONAL VESSEL  10/19/2017  . IR ANGIOGRAM SELECTIVE EACH ADDITIONAL VESSEL  11/02/2017  . IR ANGIOGRAM SELECTIVE EACH ADDITIONAL VESSEL  11/02/2017  . IR ANGIOGRAM SELECTIVE EACH ADDITIONAL VESSEL  11/02/2017  . IR ANGIOGRAM VISCERAL SELECTIVE  10/19/2017  . IR ANGIOGRAM VISCERAL SELECTIVE  10/19/2017  . IR ANGIOGRAM VISCERAL SELECTIVE  11/02/2017  . IR EMBO ARTERIAL NOT HEMORR HEMANG INC GUIDE ROADMAPPING  10/19/2017  . IR EMBO TUMOR ORGAN ISCHEMIA INFARCT INC GUIDE ROADMAPPING  11/02/2017  . IR PARACENTESIS  12/04/2017  . IR PARACENTESIS  12/07/2017  . IR PARACENTESIS  12/10/2017  . IR PARACENTESIS  12/18/2017  . IR PARACENTESIS  12/31/2017  . IR PARACENTESIS  01/04/2018  . IR PARACENTESIS  03/22/2018  . IR RADIOLOGIST EVAL & MGMT  09/18/2017  . IR RADIOLOGIST EVAL & MGMT  11/20/2017  . IR RADIOLOGIST EVAL & MGMT  02/05/2018  . IR US GUIDE VASC ACCESS RIGHT  10/19/2017  . IR US GUIDE VASC ACCESS RIGHT  11/02/2017     OB History   No obstetric history on file.      Home Medications    Prior to Admission medications   Medication Sig Start Date End Date Taking? Authorizing Provider  ALPRAZolam (XANAX) 0.25 MG tablet Take 1 tablet (0.25 mg  total) by mouth at bedtime as needed for anxiety. Patient taking differently: Take 0.25 mg by mouth at bedtime as needed for anxiety or sleep.  02/25/18  Yes Truitt Merle, MD  aspirin-acetaminophen-caffeine (EXCEDRIN EXTRA STRENGTH) [phone] MG tablet Take 1-2 tablets by mouth every 6 (six) hours as needed for headache (or pain).    Yes [provider]  calcium carbonate (TUMS - DOSED IN MG ELEMENTAL CALCIUM) 500 MG chewable tablet Chew 1 tablet by mouth as needed for indigestion or heartburn.    Yes [provider]  cefdinir (OMNICEF) 300 MG capsule Take 1 capsule (300 mg total) by mouth 2 (two) times daily for 7 days. 03/20/18  03/27/18 Yes Stallings, Zoe A, MD  furosemide (LASIX) 40 MG tablet Take 80 mg by mouth daily.  03/19/18  Yes [provider]  lactose free nutrition (BOOST) LIQD Take 237 mLs by mouth 2 (two) times daily between meals.   Yes [provider]  lactulose (CHRONULAC) 10 GM/15ML solution Take 20 g by mouth 3 (three) times daily.   Yes [provider]  Multiple Vitamins-Calcium (ONE-A-DAY WOMENS PO) Take 1 tablet by mouth daily with breakfast.   Yes [provider]  naproxen sodium (ALEVE) 220 MG tablet Take 220-440 mg by mouth daily as needed (for pain).    Yes [provider]  sertraline (ZOLOFT) 50 MG tablet Take 1 tablet (50 mg total) by mouth daily. 01/11/18  Yes Truitt Merle, MD  traMADol (ULTRAM) 50 MG tablet Take 50 mg by mouth daily as needed for moderate pain.   Yes [provider]  XIFAXAN 550 MG TABS tablet Take 550 mg by mouth 2 (two) times daily.  02/26/18  Yes [provider]  lactulose (CEPHULAC) 10 g packet Take 1 packet (10 g total) by mouth 2 (two) times daily. Patient not taking: Reported on 03/25/2018 01/01/18   Doran Stabler, MD  spironolactone (ALDACTONE) 100 MG tablet Take 200 mg by mouth daily.    [provider]  spironolactone (ALDACTONE) 50 MG tablet Please take 3 by mouth daily for a total of 150mg  a day Patient not taking: Reported on 03/25/2018 02/12/18   Doran Stabler, MD    Family History Family History  Problem Relation Age of Onset  . Migraines Mother   . Cancer Father        biological father with lung to brain   . Migraines Maternal Grandmother     Social History Social History   Tobacco Use  . Smoking status: Current Some Day Smoker    Packs/day: 0.25    Years: 30.00    Pack years: 7.50    Types: Cigarettes  . Smokeless tobacco: Never Used  Substance Use Topics  . Alcohol use: Yes    Comment: occassional 2 beers a month; never heavy use  . Drug use: No    Comment: remote h/o IV  drug use, none in decades      Allergies   Patient has no known allergies.   Review of Systems Review of Systems  Constitutional: Negative for chills and fever.  HENT: Positive for congestion. Negative for facial swelling and sore throat.   Respiratory: Positive for cough and shortness of breath.   Cardiovascular: Positive for chest pain.  Gastrointestinal: Positive for abdominal distention and abdominal pain (intermittent). Negative for diarrhea, nausea and vomiting.  Genitourinary: Negative for dysuria.  Musculoskeletal: Negative for back pain.  Skin: Negative for rash and wound.  Neurological: Negative  for headaches.  Psychiatric/Behavioral: The patient is not nervous/anxious.      Physical Exam Updated Vital Signs BP 113/63   Pulse 88   Temp 98.3 F (36.8 C)   Resp 17   SpO2 95%   Physical Exam Vitals signs and nursing note reviewed.  Constitutional:      General: She is not in acute distress.    Appearance: She is well-developed. She is not diaphoretic.  HENT:     Head: Normocephalic and atraumatic.     Mouth/Throat:     Pharynx: No oropharyngeal exudate.  Eyes:     General: No scleral icterus.       Right eye: No discharge.        Left eye: No discharge.     Conjunctiva/sclera: Conjunctivae normal.     Pupils: Pupils are equal, round, and reactive to light.  Neck:     Musculoskeletal: Normal range of motion and neck supple.     Thyroid: No thyromegaly.  Cardiovascular:     Rate and Rhythm: Normal rate and regular rhythm.     Heart sounds: Normal heart sounds. No murmur. No friction rub. No gallop.   Pulmonary:     Effort: Pulmonary effort is normal. No respiratory distress.     Breath sounds: Normal breath sounds. No stridor. No wheezing or rales.  Abdominal:     General: Bowel sounds are normal. There is distension.     Tenderness: There is no abdominal tenderness. There is no guarding or rebound.     Comments: Ascites  Lymphadenopathy:      Cervical: No cervical adenopathy.  Skin:    General: Skin is warm and dry.     Coloration: Skin is jaundiced. Skin is not pale.     Findings: No rash.  Neurological:     Mental Status: She is alert.     Coordination: Coordination normal.      ED Treatments / Results  Labs (all labs ordered are listed, but only abnormal results are displayed) Labs Reviewed  CBC - Abnormal; Notable for the following components:      Result Value   WBC 11.4 (*)    RBC 3.68 (*)    HCT 34.3 (*)    MCHC 36.2 (*)    RDW 16.9 (*)    Platelets 76 (*)    All other components within normal limits  COMPREHENSIVE METABOLIC PANEL - Abnormal; Notable for the following components:   Sodium 117 (*)    Chloride 92 (*)    CO2 17 (*)    Glucose, Bld 152 (*)    BUN 29 (*)    Creatinine, Ser 1.18 (*)    Calcium 8.2 (*)    Total Protein 6.0 (*)    Albumin 2.0 (*)    AST 125 (*)    ALT 87 (*)    Alkaline Phosphatase 142 (*)    Total Bilirubin 8.7 (*)    GFR calc non Af Amer 50 (*)    GFR calc Af Amer 58 (*)    All other components within normal limits  INFLUENZA PANEL BY PCR (TYPE A & B)  OSMOLALITY  SODIUM, URINE, RANDOM  URINALYSIS, ROUTINE W REFLEX MICROSCOPIC  OSMOLALITY, URINE  TSH  CBC WITH DIFFERENTIAL/PLATELET  I-STAT TROPONIN, ED    EKG EKG Interpretation  Date/Time:  Monday March 25 2018 14:53:34 EDT Ventricular Rate:  94 PR Interval:  142 QRS Duration: 92 QT Interval:  354 QTC Calculation: 442 R Axis:  23 Text Interpretation:  Normal sinus rhythm Cannot rule out Anterior infarct , age undetermined Abnormal ECG Confirmed by Lennice Sites [phone]) on 03/25/2018 4:35:04 PM   Radiology Dg Chest 2 View  Result Date: 03/25/2018 CLINICAL DATA:  63 y/o F; hyponatremia. Cough, shortness of breath, chest pain. EXAM: CHEST - 2 VIEW COMPARISON:  03/20/2018 chest radiograph FINDINGS: Stable cardiac silhouette within normal limits given projection and technique. Stable small to moderate  left-sided pleural effusion and left basilar opacity. No new consolidation or pneumothorax. Bones are unremarkable. IMPRESSION: Stable small to moderate left-sided pleural effusion and left basilar opacity which may represent associated atelectasis or pneumonia. Electronically Signed   By: Kristine Garbe M.D.   On: 03/25/2018 18:24    Procedures Procedures (including critical care time)  Medications Ordered in ED Medications  sodium chloride flush (NS) 0.9 % injection 3 mL (3 mLs Intravenous Not Given 03/25/18 1649)  azithromycin (ZITHROMAX) 500 mg in sodium chloride 0.9 % 250 mL IVPB (has no administration in time range)  0.9 %  sodium chloride infusion ( Intravenous Stopped 03/25/18 1941)  cefTRIAXone (ROCEPHIN) 1 g in sodium chloride 0.9 % 100 mL IVPB (0 g Intravenous Stopped 03/25/18 1940)     Initial Impression / Assessment and Plan / ED Course  I have reviewed the triage vital signs and the nursing notes.  Pertinent labs & imaging results that were available during my care of the patient were reviewed by me and considered in my medical decision making (see chart for details).        Patient presenting with community acquired pneumonia with failure of outpatient antibiotics as well as hyponatremia and hyperbilirubinemia in the setting of hepatocellular carcinoma and cirrhosis.  Rocephin and azithromycin initiated.  Patient started on gentle maintenance fluids, however stopped after 100 mL for fluid restriction.  CBC shows mild leukocytosis at 11.4.  Most likely related to pneumonia.  Patient has no abdominal tenderness.  Low suspicion of SBP.  I discussed patient case with resident, Larkin Ina, with internal medicine teaching service who accepts patient for admission.  Appreciate his assistance with the patient. I discussed patient case with Dr. Ronnald Nian who guided the patient's management and agrees with plan.   Final Clinical Impressions(s) / ED Diagnoses   Final diagnoses:    Community acquired pneumonia, unspecified laterality  Hyponatremia    ED Discharge Orders    None       Frederica Kuster, PA-C 03/25/18 1943    Lennice Sites, DO 03/26/18 0116

## 2018-03-25 NOTE — H&P (Addendum)
Date: 03/25/2018               Patient Name:  Renee Rice MRN: [id number]  DOB: 05/02/1955 Age / Sex: 63 y.o., female   PCP: Forrest Moron, MD         Medical Service: Internal Medicine Teaching Service         Attending Physician: Dr. Evette Doffing, Mallie Mussel, *    First Contact: Dr. Laural Golden Pager: [phone]  Second Contact: Dr. Philipp Ovens  Pager: [phone]       After Hours (After 5p/  First Contact Pager: [phone]  weekends / holidays): Second Contact Pager: [phone]   Chief Complaint: Cough  History of Present Illness: [name] ins a 88yoF with chronic hep C complicated by cirrhosis and hepatocellular carcinoma. She follows with  [facility]   She originally presented to her PCP on 03/20/2018 with 2 weeks of progressive dyspnea and productive cough. She was sent for a CXR that illustrated a LLL infiltrate and was started on Cefdinir. She then presented to have an EGD done on 03/22/2018 however, felt very unwell so it was cancelled. Labs were drawn at that time and she was found to have worsening hyponatremia. She was sent to the ED. It was recommended that she come into the hospital for further evaluation but she declined. She was told to stop her Spironolactone and Furosemide. She then went into follow-up labs today, was found to have worsening hyponatremia, then asked to come to the ED for worsening hyponatremia.   She currently is endorsing generalized weakness, dyspnea, productive cough (green sputum), increased abdominal girth, N/V (nonbloody), gait instability, and "not thinking straight." Feels that this is 2/2 to her changes in medications. She has also had some chest discomfort. Describes it as a spasm. Lasts about 5 minutes. Pressure seems to help but has not tried anything for it.   In regards to her liver she does have [place] and recently stopped treatments as it was not helping. She was told that there was really nothing else that could be done. She  expresses that "I am just waiting on someone to tell me how long I have to live." Her last paracentesis was 3 days ago, with 3.8L removed. Was supposed to get an EGD recently (03/22/2018) but canceled due to not feeling well. Discontinued her furosemide and spironolactone on 03/22/2018. Anywhere from 3-6 loose BM per day.   Meds:  Current Facility-Administered Medications for the 03/25/18 encounter [facility] Encounter)  Medication  . 0.9 %  sodium chloride infusion  . albumin human 25 % solution 25 g   Current Meds  Medication Sig  . ALPRAZolam (XANAX) 0.25 MG tablet Take 1 tablet (0.25 mg total) by mouth at bedtime as needed for anxiety. (Patient taking differently: Take 0.25 mg by mouth at bedtime as needed for anxiety or sleep. )  . aspirin-acetaminophen-caffeine (EXCEDRIN EXTRA STRENGTH) 250-250-65 MG tablet Take 1-2 tablets by mouth every 6 (six) hours as needed for headache (or pain).   . calcium carbonate (TUMS - DOSED IN MG ELEMENTAL CALCIUM) 500 MG chewable tablet Chew 1 tablet by mouth as needed for indigestion or heartburn.   . cefdinir (OMNICEF) 300 MG capsule Take 1 capsule (300 mg total) by mouth 2 (two) times daily for 7 days.  . furosemide (LASIX) 40 MG tablet Take 80 mg by mouth daily.   Marland Kitchen lactose free nutrition (BOOST) LIQD Take 237 mLs by mouth 2 (two) times daily between meals.  Marland Kitchen  lactulose (CHRONULAC) 10 GM/15ML solution Take 20 g by mouth 3 (three) times daily.  . Multiple Vitamins-Calcium (ONE-A-DAY WOMENS PO) Take 1 tablet by mouth daily with breakfast.  . naproxen sodium (ALEVE) 220 MG tablet Take 220-440 mg by mouth daily as needed (for pain).   Marland Kitchen sertraline (ZOLOFT) 50 MG tablet Take 1 tablet (50 mg total) by mouth daily.  . traMADol (ULTRAM) 50 MG tablet Take 50 mg by mouth daily as needed for moderate pain.  Marland Kitchen XIFAXAN 550 MG TABS tablet Take 550 mg by mouth 2 (two) times daily.    Allergies: Allergies as of 03/25/2018  . (No Known Allergies)   Past Medical History:   Diagnosis Date  . Allergic rhinitis   . Chronic viral hepatitis C ([place]) 04/27/2017   with cirrhosis  . Depression    in the past, no meds, no previous hospitalization   . Heart murmur    Family History: Adopted. No known history of medical conditions in her biological family.   Social History: Lives with her Husband. Worked with [employer] until the week before Christmas. Since that time she has been in and out of the hospital. She is concerned that she won't be able to return to work. She does not have any children but does have a niece. At home she is very dependent on her husband (he has been out of work for 4 months). They do not have much social support. Her father has advanced dementia and her mother, 60 y.o, is unable to help much.   Review of Systems: A complete ROS was negative except as per HPI.   Physical Exam: Blood pressure 113/63, pulse 88, temperature 98.3 F (36.8 C), resp. rate 17, SpO2 95 %. Gen: ill appearing, NAD Skin: jaundiced, scattered spider angiomas HENT: no scleral icterus, moist MM Cardiac: RRR, no m/r/g, no JVD Pulm: normal wob, decreased breath sounds at the left base with dullness to percussion, no wheezing or crackles  Abd: soft, distended, +fluid wave, +BS, slightly TTP in the RUQ Ext: trace bilateral LEE, no asterixis  Neuro: A&O x 4, CN 2-12 grossly intact, moving all extremities   Labs:  Na 117 K 5.4 --> 5.0 CO2 17 Creatine 1.15 Alk phos 142 Albumin 2.0 T. Bili 8.7 INR 2.2  WBC 11.4  Serum osm 262 Urine osm 219 Urine Na 54  TSH 7.74  Flu A/B neg  EKG: personally reviewed my interpretation is NSR  CXR: personally reviewed my interpretation is left pleural effusion  Assessment & Plan by Problem: Active Problems:   Hyponatremia with excess extracellular fluid volume  [name] ins a 23yoF with chronic hep C complicated by cirrhosis and hepatocellular carcinoma presenting with worsening hypervolemic hyponatremia and left sided  pleural effusion.  Hypervolemic Hyponatremia Decompensated Cirrhosis, Chronic Hep C: Hep C treatment has been delayed due to [facility] diagnosis. MELD score 31, MR in Jan showed esophageal varices - Significant ascites on exam, recent paracentesis 3 days ago after which all diuretics were stopped.  - Na 117 on admission, most likely due to hypervolemia and decreased renal perfusion despite urina sodium not agreeing with this diagnosis. TSH elevated so hypothyroidism could also be contributing.  - diurese with IV lasix 45m  - goal Na 121-123 - mild abdominal pain, doubt SBP but she received ceftriaxone per CAP coverage  - continue home spironolactone but decrease dose given hyperkalemia  - continue home lactulose, rifaximin  - consult IR for paracentesis and possible left thoracentesis  - if  blood pressure decreases, will consider use of albumin and midodrine so we can continue diuresis  Left lower lobe pneumonia Hemoptysis - presented with two weeks of cough productive of green sputum, dyspnea, leukocytosis. Failed outpt treatment with cefdinir  - s/p ceftriaxone and azithromycin for CAP - small volume hemoptysis, most likely 2/2 pneumonia - she does have known esophageal varices seen on MRI in January - if worsens will need to consider intubation and ICU transfer  Hepatocellular Carcinoma: Oncologist Dr. Burr Medico, hepatocellular cancer diagnosed in June 2019, [place] in October followed by decompensated liver cirrhosis and the need for recurrent paracenteses. Not a candidate for additional Y 90. May start systemic therapy if her disease progresses.    Anxiety/depression: continue home xanax, zoloft    DVT ppx: SCDs Diet: HH Code: full, confirmed with patient   Dispo: Admit patient to Inpatient with expected length of stay greater than 2 midnights.  Signed: Isabelle Course, MD 03/25/2018, 8:18 PM  Pager: [phone]

## 2018-03-25 NOTE — ED Notes (Signed)
ED Provider at bedside. 

## 2018-03-25 NOTE — ED Notes (Signed)
Pt reports nausea and epigastric pain that radiates straight down to her navel and to her LLQ.  Zofran administered as ordered

## 2018-03-25 NOTE — ED Notes (Signed)
Pt reports hemoptysis with bright red blood.  [facility]berg MD notified.

## 2018-03-25 NOTE — ED Notes (Signed)
Admitting MDs at bedside.  Family is at bedside

## 2018-03-25 NOTE — ED Notes (Signed)
Patient transported to X-ray 

## 2018-03-25 NOTE — ED Triage Notes (Signed)
Pt here for eval of low sodium. Has liver disease, hx of multiple paracenteses performed.

## 2018-03-25 NOTE — ED Notes (Signed)
Helberg MD at bedside.

## 2018-03-25 NOTE — ED Notes (Signed)
Helberg MD is at bedside to re-evaluate.

## 2018-03-25 NOTE — Telephone Encounter (Signed)
Pt had BMET rechecked, critical Sodium of 118 called from lab.

## 2018-03-25 NOTE — Telephone Encounter (Signed)
I have reviewed the ED provider note from 3/6.  The BMP today also shows the potassium is elevated.    Per our conversation just now, please call this patient and her husband because I recommend return to [facility] ED now for hospital admission.  This is a critically low sodium level.  Please also call ED charge nurse so they are aware of patient.

## 2018-03-25 NOTE — Telephone Encounter (Signed)
Called and spoke with husband and he is aware of lab results and the need for Halli to be admitted to the hospital. Pt wants to go to [facility] ER. Call placed to ER charge nurse and she of pts abnormal labs and that she needs to be admitted.

## 2018-03-26 ENCOUNTER — Other Ambulatory Visit: Payer: Self-pay

## 2018-03-26 ENCOUNTER — Encounter (HOSPITAL_COMMUNITY): Payer: Self-pay

## 2018-03-26 DIAGNOSIS — J918 Pleural effusion in other conditions classified elsewhere: Secondary | ICD-10-CM

## 2018-03-26 LAB — CBC
HCT: 30.7 % — ABNORMAL LOW (ref 36.0–46.0)
Hemoglobin: 11.5 g/dL — ABNORMAL LOW (ref 12.0–15.0)
MCH: 34 pg (ref 26.0–34.0)
MCHC: 37.5 g/dL — ABNORMAL HIGH (ref 30.0–36.0)
MCV: 90.8 fL (ref 80.0–100.0)
Platelets: 64 10*3/uL — ABNORMAL LOW (ref 150–400)
RBC: 3.38 MIL/uL — ABNORMAL LOW (ref 3.87–5.11)
RDW: 16.3 % — ABNORMAL HIGH (ref 11.5–15.5)
WBC: 11.2 10*3/uL — AB (ref 4.0–10.5)
nRBC: 0.2 % (ref 0.0–0.2)

## 2018-03-26 LAB — PROTIME-INR
INR: 2.2 — ABNORMAL HIGH (ref 0.8–1.2)
Prothrombin Time: 24.2 seconds — ABNORMAL HIGH (ref 11.4–15.2)

## 2018-03-26 LAB — COMPREHENSIVE METABOLIC PANEL
ALT: 84 U/L — ABNORMAL HIGH (ref 0–44)
ANION GAP: 9 (ref 5–15)
AST: 123 U/L — ABNORMAL HIGH (ref 15–41)
Albumin: 1.9 g/dL — ABNORMAL LOW (ref 3.5–5.0)
Alkaline Phosphatase: 144 U/L — ABNORMAL HIGH (ref 38–126)
BUN: 31 mg/dL — ABNORMAL HIGH (ref 8–23)
CO2: 18 mmol/L — ABNORMAL LOW (ref 22–32)
Calcium: 8.1 mg/dL — ABNORMAL LOW (ref 8.9–10.3)
Chloride: 91 mmol/L — ABNORMAL LOW (ref 98–111)
Creatinine, Ser: 1.2 mg/dL — ABNORMAL HIGH (ref 0.44–1.00)
GFR calc Af Amer: 56 mL/min — ABNORMAL LOW (ref >60)
GFR calc non Af Amer: 49 mL/min — ABNORMAL LOW (ref >60)
Glucose, Bld: 128 mg/dL — ABNORMAL HIGH (ref 70–99)
POTASSIUM: 4.8 mmol/L (ref 3.5–5.1)
Sodium: 118 mmol/L — CL (ref 135–145)
Total Bilirubin: 7.6 mg/dL — ABNORMAL HIGH (ref 0.3–1.2)
Total Protein: 5.5 g/dL — ABNORMAL LOW (ref 6.5–8.1)

## 2018-03-26 LAB — BASIC METABOLIC PANEL
Anion gap: 7 (ref 5–15)
Anion gap: 8 (ref 5–15)
BUN: 33 mg/dL — ABNORMAL HIGH (ref 8–23)
BUN: 32 mg/dL — AB (ref 8–23)
CO2: 18 mmol/L — ABNORMAL LOW (ref 22–32)
CO2: 20 mmol/L — ABNORMAL LOW (ref 22–32)
CREATININE: 1.21 mg/dL — AB (ref 0.44–1.00)
Calcium: 7.9 mg/dL — ABNORMAL LOW (ref 8.9–10.3)
Calcium: 8.4 mg/dL — ABNORMAL LOW (ref 8.9–10.3)
Chloride: 91 mmol/L — ABNORMAL LOW (ref 98–111)
Chloride: 93 mmol/L — ABNORMAL LOW (ref 98–111)
Creatinine, Ser: 1.39 mg/dL — ABNORMAL HIGH (ref 0.44–1.00)
GFR calc Af Amer: 47 mL/min — ABNORMAL LOW (ref >60)
GFR calc Af Amer: 56 mL/min — ABNORMAL LOW (ref >60)
GFR calc non Af Amer: 41 mL/min — ABNORMAL LOW (ref >60)
GFR calc non Af Amer: 48 mL/min — ABNORMAL LOW (ref >60)
GLUCOSE: 152 mg/dL — AB (ref 70–99)
Glucose, Bld: 139 mg/dL — ABNORMAL HIGH (ref 70–99)
Potassium: 4.7 mmol/L (ref 3.5–5.1)
Potassium: 6.2 mmol/L — ABNORMAL HIGH (ref 3.5–5.1)
Sodium: 118 mmol/L — CL (ref 135–145)
Sodium: 119 mmol/L — CL (ref 135–145)

## 2018-03-26 LAB — TSH: TSH: 7.219 u[IU]/mL — ABNORMAL HIGH (ref 0.350–4.500)

## 2018-03-26 LAB — T4, FREE: Free T4: 0.98 ng/dL (ref 0.82–1.77)

## 2018-03-26 LAB — HIV ANTIBODY (ROUTINE TESTING W REFLEX): HIV Screen 4th Generation wRfx: NONREACTIVE

## 2018-03-26 MED ORDER — FUROSEMIDE 10 MG/ML IJ SOLN
40.0000 mg | Freq: Two times a day (BID) | INTRAMUSCULAR | Status: DC
  Administered 2018-03-26 – 2018-04-01 (×12): 40 mg via INTRAVENOUS
  Filled 2018-03-26 (×12): qty 4

## 2018-03-26 NOTE — Progress Notes (Addendum)
   Subjective: Renee Rice reports feeling poorly this morning. She states she is feeling distended in her abdomen and lower extremities. She said she felt improved after her paracentesis on Friday but then continued to have fluid build up. She states she has been needing paracentesis' more frequently for the past ~6 months. She also reports a cough with green phlegm and blood. She endorses some nausea and states she has 2-3 episodes of emesis a week. All questions and concerns addressed.   Objective:  Vital signs in last 24 hours: Vitals:   03/25/18 2030 03/25/18 2224 03/26/18 0156 03/26/18 0500  BP: (!) 99/55 (!) 128/92 119/66 106/64  Pulse: 88 93  91  Resp: (!) 21 (!) 23  17  Temp:  98.1 F (36.7 C)  97.6 F (36.4 C)  TempSrc:  Oral  Oral  SpO2: 96% 92%    Weight:  86.2 kg  86.3 kg  Height:  5\' 11"  (1.803 m)     Physical Exam Gen: ill appearing, no distress Skin: jaundiced, scattered spider angiomas  CV: RRR, no murmurs  Abdomen: distended, TTP in RLQ and RUQ, bowel sounds present  Ext: bilateral 2+ pitting edema, no asterixis   POCT Abdominal US - ascites appreciated mostly on the right side  POCT Lung [place] - bilateral pleural effusions appreciated   Assessment/Plan:  Principal Problem:   Hyponatremia with excess extracellular fluid volume Active Problems:   Ascites   Other cirrhosis of liver (HCC)   Hepatocellular carcinoma (HCC)  Renee Rice ins a 61yoF with chronic hep C complicated by cirrhosis and hepatocellular carcinoma presenting with worsening hypervolemic hyponatremia and left sided pleural effusion.  Hypervolemic Hyponatremia Cirrhosis 2/2 Chronic Hep C: Overall presentation concerning for decompensated cirrhosis.  Hep C treatment has been delayed due to [facility] diagnosis. MELD score 31, MR in Jan showed esophageal varices. Discussed patient with Dr. Loletha Carrow- he has been managing her end stage cirrhosis and states she is not a candidate for TIPS or transplant. Her  renal function is sensitive and it has been difficult to manage her volume status outpatient on diuretics. Will consult nephrology given her severe hyponatremia. - Consulted nephrology, appreciate recommendations  - Significant ascites on exam, recent paracentesis on 3/6 - Na 118, goal Na 121-123 - Diuresed with IV lasix 40mg  and spironolactone 100 mg. Will discontinue for now  - Continue home lactulose, rifaximin  - Consider IR consult for paracentesis and possible left thoracentesis  - If blood pressure decreases, will consider use of albumin and midodrine so we can continue diuresis  Left lower lobe pneumonia Hemoptysis - presented with two weeks of cough productive of green sputum, dyspnea, leukocytosis. Failed outpt treatment with cefdinir  - Afebrile and with no leukocytosis on presentation. Will discontinue antibiotics and observe  - Discontinue ceftriaxone and vancomycin  Hepatocellular Carcinoma: Oncologist Dr. Burr Medico, hepatocellular cancer diagnosed in June 2019, [place] in October followed by decompensated liver cirrhosis and the need for recurrent paracenteses. Not a candidate for additional Y 90. May start systemic therapy if her disease progresses.     Dispo: Anticipated discharge pending clinical improvement.  Mike Craze, DO 03/26/2018, 1:29 PM Pager: [phone]

## 2018-03-26 NOTE — Progress Notes (Signed)
Pt having episode on emesis. Appeared to be undigested fruit. Pt states feeling better after she vomited. Cont to monitor. Carroll Kinds RN

## 2018-03-26 NOTE — Progress Notes (Signed)
CRITICAL VALUE ALERT Sodium Critical Value: 118  Date & Time Notied: 3/10, 0200  Provider Notified: Donne Hazel  Orders Received/Actions taken: spoke to MD, got orders. Will continue to monitor patient.

## 2018-03-26 NOTE — Consult Note (Signed)
Referring Provider: No ref. provider found Primary Care Physician:  Forrest Moron, MD Primary Nephrologist:    Reason for Consultation: Hyponatremia, volume overload, hepatic cirrhosis, chronic kidney disease stage III.  HPI: This is a 63 year old lady who has a history of cirrhosis hepatitis C complicated by hepatocellular carcinoma.  She was admitted for worsening hyponatremia.  Her cirrhosis is been fairly rapidly progressive since 2018 was managed by Dr. Loletha Carrow.  She is not a candidate for liver transplantation or for TIPS.  She has been treated previously for hepatocellular carcinoma with radio embolization.  She has had repeated paracenteses since November for abdominal ascites.  Her creatinine appears to be about 1.0-1.2 at baseline although it does seem that since January 2020 there has been a progressive rise in her creatinine.  She admits to making urine.  She does drink copious amounts of fluid and there are numerous drinks and juices in the room.  Serum sodium has been low since December 2019 but has been maintained in the 130 range.  Since March 22, 2018 appears that the serum sodium has dropped from 122 to 118.  She does have some lower extremity swelling and some tense ascites.  I do not see a recent TSH.  Last one in the epic records is 1.56 in October 2013 I do not see any studies looking at her adrenal axis.  She does have chronic kidney disease.  Urine microscopy from January 05, 2018 shows 0-5 RBCs 0-5 WBCs and squamous epithelial cells.  Her urine sodium at that time was 81 with a urine osmolality of 219.  Her outpatient medications include Excedrin, Tums as needed, furosemide 80 mg daily, lactulose twice daily, multivitamins 1 daily, naproxen as needed for pain Zoloft 50 mg daily, spironolactone 150 mg daily.  Blood pressure 117/85 pulse 88 temperature 97.7 O2 sats 91% room air  Urine output 700 cc 03/26/2018   300 cc 03/25/2018  Sodium 118 potassium 4.8 chloride 91 CO2 18 BUN  31 creatinine 1.2 glucose 128 calcium 8.1 alkaline phosphatase 144 AST 123 ALT 84 WBC 11.2 hemoglobin 9.5 platelets 64  Past Medical History:  Diagnosis Date  . Allergic rhinitis   . Chronic viral hepatitis C ([place]) 04/27/2017   with cirrhosis  . Depression    in the past, no meds, no previous hospitalization   . Heart murmur     Past Surgical History:  Procedure Laterality Date  . IR ANGIOGRAM SELECTIVE EACH ADDITIONAL VESSEL  10/19/2017  . IR ANGIOGRAM SELECTIVE EACH ADDITIONAL VESSEL  10/19/2017  . IR ANGIOGRAM SELECTIVE EACH ADDITIONAL VESSEL  10/19/2017  . IR ANGIOGRAM SELECTIVE EACH ADDITIONAL VESSEL  10/19/2017  . IR ANGIOGRAM SELECTIVE EACH ADDITIONAL VESSEL  11/02/2017  . IR ANGIOGRAM SELECTIVE EACH ADDITIONAL VESSEL  11/02/2017  . IR ANGIOGRAM SELECTIVE EACH ADDITIONAL VESSEL  11/02/2017  . IR ANGIOGRAM VISCERAL SELECTIVE  10/19/2017  . IR ANGIOGRAM VISCERAL SELECTIVE  10/19/2017  . IR ANGIOGRAM VISCERAL SELECTIVE  11/02/2017  . IR EMBO ARTERIAL NOT HEMORR HEMANG INC GUIDE ROADMAPPING  10/19/2017  . IR EMBO TUMOR ORGAN ISCHEMIA INFARCT INC GUIDE ROADMAPPING  11/02/2017  . IR PARACENTESIS  12/04/2017  . IR PARACENTESIS  12/07/2017  . IR PARACENTESIS  12/10/2017  . IR PARACENTESIS  12/18/2017  . IR PARACENTESIS  12/31/2017  . IR PARACENTESIS  01/04/2018  . IR PARACENTESIS  03/22/2018  . IR RADIOLOGIST EVAL & MGMT  09/18/2017  . IR RADIOLOGIST EVAL & MGMT  11/20/2017  . IR RADIOLOGIST EVAL &  MGMT  02/05/2018  . IR US GUIDE VASC ACCESS RIGHT  10/19/2017  . IR US GUIDE VASC ACCESS RIGHT  11/02/2017    Prior to Admission medications   Medication Sig Start Date End Date Taking? Authorizing Provider  ALPRAZolam (XANAX) 0.25 MG tablet Take 1 tablet (0.25 mg total) by mouth at bedtime as needed for anxiety. Patient taking differently: Take 0.25 mg by mouth at bedtime as needed for anxiety or sleep.  02/25/18  Yes Truitt Merle, MD  aspirin-acetaminophen-caffeine (EXCEDRIN EXTRA STRENGTH)  [phone] MG tablet Take 1-2 tablets by mouth every 6 (six) hours as needed for headache (or pain).    Yes [provider]  calcium carbonate (TUMS - DOSED IN MG ELEMENTAL CALCIUM) 500 MG chewable tablet Chew 1 tablet by mouth as needed for indigestion or heartburn.    Yes [provider]  cefdinir (OMNICEF) 300 MG capsule Take 1 capsule (300 mg total) by mouth 2 (two) times daily for 7 days. 03/20/18 03/27/18 Yes Stallings, Zoe A, MD  furosemide (LASIX) 40 MG tablet Take 80 mg by mouth daily.  03/19/18  Yes [provider]  lactose free nutrition (BOOST) LIQD Take 237 mLs by mouth 2 (two) times daily between meals.   Yes [provider]  lactulose (CHRONULAC) 10 GM/15ML solution Take 20 g by mouth 3 (three) times daily.   Yes [provider]  Multiple Vitamins-Calcium (ONE-A-DAY WOMENS PO) Take 1 tablet by mouth daily with breakfast.   Yes [provider]  naproxen sodium (ALEVE) 220 MG tablet Take 220-440 mg by mouth daily as needed (for pain).    Yes [provider]  sertraline (ZOLOFT) 50 MG tablet Take 1 tablet (50 mg total) by mouth daily. 01/11/18  Yes Truitt Merle, MD  traMADol (ULTRAM) 50 MG tablet Take 50 mg by mouth daily as needed for moderate pain.   Yes [provider]  XIFAXAN 550 MG TABS tablet Take 550 mg by mouth 2 (two) times daily.  02/26/18  Yes [provider]  lactulose (CEPHULAC) 10 g packet Take 1 packet (10 g total) by mouth 2 (two) times daily. Patient not taking: Reported on 03/25/2018 01/01/18   Doran Stabler, MD  spironolactone (ALDACTONE) 100 MG tablet Take 200 mg by mouth daily.    [provider]  spironolactone (ALDACTONE) 50 MG tablet Please take 3 by mouth daily for a total of 150mg  a day Patient not taking: Reported on 03/25/2018 02/12/18   Doran Stabler, MD    Current Facility-Administered Medications  Medication Dose Route Frequency Provider Last Rate Last Dose  .  acetaminophen (TYLENOL) tablet 650 mg  650 mg Oral Q6H PRN Ina Homes, MD       Or  . acetaminophen (TYLENOL) suppository 650 mg  650 mg Rectal Q6H PRN Ina Homes, MD      . ALPRAZolam Duanne Moron) tablet 0.25 mg  0.25 mg Oral QHS PRN Ina Homes, MD   0.25 mg at 03/25/18 2237  . calcium carbonate (TUMS - dosed in mg elemental calcium) chewable tablet 200 mg of elemental calcium  1 tablet Oral Daily PRN Ina Homes, MD      . lactose free nutrition (BOOST PLUS) liquid 237 mL  237 mL Oral BID BM Helberg, Justin, MD   237 mL at 03/26/18 1022  . lactulose (CHRONULAC) 10 GM/15ML solution 20 g  20 g Oral TID Ina Homes, MD   20 g at 03/26/18 1602  . ondansetron (ZOFRAN) tablet  4 mg  4 mg Oral Q6H PRN Ina Homes, MD       Or  . ondansetron (ZOFRAN) injection 4 mg  4 mg Intravenous Q6H PRN Ina Homes, MD   4 mg at 03/25/18 2048  . rifaximin (XIFAXAN) tablet 550 mg  550 mg Oral BID Ina Homes, MD   550 mg at 03/26/18 1021    Allergies as of 03/25/2018  . (No Known Allergies)    Family History  Problem Relation Age of Onset  . Migraines Mother   . Cancer Father        biological father with lung to brain   . Migraines Maternal Grandmother     Social History   Socioeconomic History  . Marital status: Married    Spouse name: Not on file  . Number of children: 0  . Years of education: Not on file  . Highest education level: Not on file  Occupational History  . Occupation: Geophysicist/field seismologist, Hydrologist    Comment: full time   Social Needs  . Financial resource strain: Not on file  . Food insecurity:    Worry: Not on file    Inability: Not on file  . Transportation needs:    Medical: Not on file    Non-medical: Not on file  Tobacco Use  . Smoking status: Current Some Day Smoker    Packs/day: 0.25    Years: 30.00    Pack years: 7.50    Types: Cigarettes  . Smokeless tobacco: Never Used  Substance and Sexual Activity  . Alcohol use: Yes    Comment:  occassional 2 beers a month; never heavy use  . Drug use: No    Comment: remote h/o IV drug use, none in decades   . Sexual activity: Not Currently  Lifestyle  . Physical activity:    Days per week: Not on file    Minutes per session: Not on file  . Stress: Not on file  Relationships  . Social connections:    Talks on phone: Not on file    Gets together: Not on file    Attends religious service: Not on file    Active member of club or organization: Not on file    Attends meetings of clubs or organizations: Not on file    Relationship status: Not on file  . Intimate partner violence:    Fear of current or ex partner: Not on file    Emotionally abused: Not on file    Physically abused: Not on file    Forced sexual activity: Not on file  Other Topics Concern  . Not on file  Social History Narrative  . Not on file    Review of Systems: Gen: Weak debilitated without fever sweats or chills HEENT: No visual complaints, No history of Retinopathy. Normal external appearance No Epistaxis or Sore throat. No sinusitis.   CV: Denies chest pain, angina, palpitations, syncope, orthopnea, PND, peripheral edema, and claudication. Resp: Denies dyspnea at rest, dyspnea with exercise, cough, sputum, wheezing, coughing up blood, and pleurisy. GI: Abdominal distention with no hematemesis or melena stool GU : Denies urinary burning, blood in urine, urinary frequency, urinary hesitancy, nocturnal urination, and urinary incontinence.  No renal calculi. MS: Weakness with as needed use of naproxen Derm: Denies rash, itching, dry skin, hives, moles, warts, or unhealing ulcers.  Psych: Denies depression, anxiety, memory loss, suicidal ideation, hallucinations, paranoia, and confusion. Heme: Denies bruising, bleeding, and enlarged lymph nodes. Neuro: No headache.  No  diplopia. No dysarthria.  No dysphasia.  No history of CVA.  No Seizures. No paresthesias.  No weakness. Endocrine No DM.  No Thyroid  disease.  No Adrenal disease.  Physical Exam: Vital signs in last 24 hours: Temp:  [97.6 F (36.4 C)-98.1 F (36.7 C)] 97.7 F (36.5 C) (03/10 1411) Pulse Rate:  [88-95] 88 (03/10 1411) Resp:  [15-23] 17 (03/10 1411) BP: (99-128)/(50-92) 117/85 (03/10 1411) SpO2:  [91 %-97 %] 91 % (03/10 1411) Weight:  [86.2 kg-86.3 kg] 86.3 kg (03/10 0500)   General:   Ill-appearing cirrhotic lady spider angiomata cachectic Head:  Normocephalic and atraumatic. Eyes:  Sclera clear, no icterus.   Conjunctiva pink. Ears:  Normal auditory acuity. Nose:  No deformity, discharge,  or lesions. Mouth:  No deformity or lesions, dentition normal. Neck:  Supple; no masses or thyromegaly. JVP not elevated Lungs:  Clear throughout to auscultation.   No wheezes, crackles, or rhonchi. No acute distress. Heart:  Regular rate and rhythm; no murmurs, clicks, rubs,  or gallops. Abdomen: Soft nontender ascitic bowel sounds active Msk:  Symmetrical without gross deformities. Normal posture. Pulses:  No carotid, renal, femoral bruits. DP and PT symmetrical and equal Extremities: 1+ pitting edema Neurologic:  Alert and  oriented x4;  grossly normal neurologically. Skin:  Intact without significant lesions or rashes. Cervical Nodes:  No significant cervical adenopathy. Psych:  Alert and cooperative. Normal mood and affect.  Intake/Output from previous day: 03/09 0701 - 03/10 0700 In: 870 [P.O.:520; IV Piggyback:350] Out: 600 [Urine:600] Intake/Output this shift: Total I/O In: 120 [P.O.:120] Out: 550 [Urine:400; Emesis/NG output:150]  Lab Results: Recent Labs    03/25/18 1502 03/26/18 0412  WBC 11.4* 11.2*  HGB 12.4 11.5*  HCT 34.3* 30.7*  PLT 76* 64*   BMET Recent Labs    03/25/18 1502 03/26/18 0009 03/26/18 0412  NA 117* 118* 118*  K 5.0 6.2* 4.8  CL 92* 93* 91*  CO2 17* 18* 18*  GLUCOSE 152* 139* 128*  BUN 29* 32* 31*  CREATININE 1.18* 1.21* 1.20*  CALCIUM 8.2* 7.9* 8.1*   LFT Recent  Labs    03/26/18 0412  PROT 5.5*  ALBUMIN 1.9*  AST 123*  ALT 84*  ALKPHOS 144*  BILITOT 7.6*   PT/INR Recent Labs    03/26/18 0412  LABPROT 24.2*  INR 2.2*   Hepatitis Panel No results for input(s): HEPBSAG, HCVAB, HEPAIGM, HEPBIGM in the last 72 hours.  Studies/Results: Dg Chest 2 View  Result Date: 03/25/2018 CLINICAL DATA:  63 y/o F; hyponatremia. Cough, shortness of breath, chest pain. EXAM: CHEST - 2 VIEW COMPARISON:  03/20/2018 chest radiograph FINDINGS: Stable cardiac silhouette within normal limits given projection and technique. Stable small to moderate left-sided pleural effusion and left basilar opacity. No new consolidation or pneumothorax. Bones are unremarkable. IMPRESSION: Stable small to moderate left-sided pleural effusion and left basilar opacity which may represent associated atelectasis or pneumonia. Electronically Signed   By: Kristine Garbe M.D.   On: 03/25/2018 18:24    Assessment/Plan:  Hyponatremia.  Patient appears to have hypovolemic hyponatremia.  This appears to be a clinical diagnosis in the setting of her cirrhosis and ascites as well as her lower extremity edema.  Will check urine osmolalities as well as urine sodium.  Urine sodium may be falsely elevated due to the use of Lasix diuretics.  And her urine osmolality may be falsely low.  We should go ahead and order the studies for the sake of completion.  Be  reasonable to check a cortisol tomorrow a.m. and to check a thyroid stimulating hormone level.  He also has some renal insufficiency with a creatinine that has gone from 0.6-1.2 in the base of 2 to 3 months.  This may be significant.  We shall manage the patient with IV Lasix and fluid restriction.  I believe this will be the best approach to managing her chronic hyponatremia.  We shall need to follow her intake closely as well as her output.  Chronic kidney disease stage III I believe this lady has known chronic kidney disease stage III she  has progression of her renal function.  We may be able to call his acute kidney injury.  This is in the setting of paracentesis and ascites and decompensated liver failure.  This may be hepatorenal syndrome developing.  I.e. type II.  At this point there is little to add except to continue to monitor and manage symptoms.  Continue to avoid nephrotoxic agents would avoid ACE inhibitors Cox 2 inhibitors ARB's and nonsteroidal anti-inflammatory drugs.  Would avoid IV contrast.  Will check renal ultrasound  Anemia stable no issue at this time.  Hepatitis C with cirrhosis and ascites.  Not candidate for liver transplantation not candidate for TIPS procedure.  Rapid decompensation of liver disease with multiple paracenteses since November 2019.  Hepatocellular carcinoma with radiation and embolization.   LOS: [place] @TODAY @4 :44 PM

## 2018-03-27 ENCOUNTER — Inpatient Hospital Stay (HOSPITAL_COMMUNITY): Payer: BLUE CROSS/BLUE SHIELD

## 2018-03-27 ENCOUNTER — Encounter (HOSPITAL_COMMUNITY): Payer: Self-pay | Admitting: Primary Care

## 2018-03-27 DIAGNOSIS — Z7189 Other specified counseling: Secondary | ICD-10-CM

## 2018-03-27 DIAGNOSIS — N179 Acute kidney failure, unspecified: Secondary | ICD-10-CM

## 2018-03-27 DIAGNOSIS — E871 Hypo-osmolality and hyponatremia: Principal | ICD-10-CM

## 2018-03-27 DIAGNOSIS — N183 Chronic kidney disease, stage 3 (moderate): Secondary | ICD-10-CM

## 2018-03-27 DIAGNOSIS — J90 Pleural effusion, not elsewhere classified: Secondary | ICD-10-CM

## 2018-03-27 DIAGNOSIS — Z515 Encounter for palliative care: Secondary | ICD-10-CM

## 2018-03-27 LAB — BASIC METABOLIC PANEL
ANION GAP: 8 (ref 5–15)
Anion gap: 10 (ref 5–15)
Anion gap: 7 (ref 5–15)
BUN: 32 mg/dL — ABNORMAL HIGH (ref 8–23)
BUN: 32 mg/dL — ABNORMAL HIGH (ref 8–23)
BUN: 34 mg/dL — ABNORMAL HIGH (ref 8–23)
CALCIUM: 8.5 mg/dL — AB (ref 8.9–10.3)
CO2: 17 mmol/L — ABNORMAL LOW (ref 22–32)
CO2: 19 mmol/L — ABNORMAL LOW (ref 22–32)
CO2: 22 mmol/L (ref 22–32)
Calcium: 8.2 mg/dL — ABNORMAL LOW (ref 8.9–10.3)
Calcium: 8.2 mg/dL — ABNORMAL LOW (ref 8.9–10.3)
Chloride: 90 mmol/L — ABNORMAL LOW (ref 98–111)
Chloride: 92 mmol/L — ABNORMAL LOW (ref 98–111)
Chloride: 92 mmol/L — ABNORMAL LOW (ref 98–111)
Creatinine, Ser: 1.29 mg/dL — ABNORMAL HIGH (ref 0.44–1.00)
Creatinine, Ser: 1.29 mg/dL — ABNORMAL HIGH (ref 0.44–1.00)
Creatinine, Ser: 1.31 mg/dL — ABNORMAL HIGH (ref 0.44–1.00)
GFR calc Af Amer: 51 mL/min — ABNORMAL LOW (ref >60)
GFR calc Af Amer: 52 mL/min — ABNORMAL LOW (ref >60)
GFR calc Af Amer: 52 mL/min — ABNORMAL LOW (ref >60)
GFR calc non Af Amer: 44 mL/min — ABNORMAL LOW (ref >60)
GFR calc non Af Amer: 45 mL/min — ABNORMAL LOW (ref >60)
GFR calc non Af Amer: 45 mL/min — ABNORMAL LOW (ref >60)
Glucose, Bld: 105 mg/dL — ABNORMAL HIGH (ref 70–99)
Glucose, Bld: 133 mg/dL — ABNORMAL HIGH (ref 70–99)
Glucose, Bld: 156 mg/dL — ABNORMAL HIGH (ref 70–99)
Potassium: 4.8 mmol/L (ref 3.5–5.1)
Potassium: 4.9 mmol/L (ref 3.5–5.1)
Potassium: 5 mmol/L (ref 3.5–5.1)
SODIUM: 119 mmol/L — AB (ref 135–145)
SODIUM: 119 mmol/L — AB (ref 135–145)
Sodium: 119 mmol/L — CL (ref 135–145)

## 2018-03-27 LAB — RENAL FUNCTION PANEL
ANION GAP: 6 (ref 5–15)
Albumin: 1.9 g/dL — ABNORMAL LOW (ref 3.5–5.0)
BUN: 33 mg/dL — ABNORMAL HIGH (ref 8–23)
CO2: 23 mmol/L (ref 22–32)
Calcium: 8.6 mg/dL — ABNORMAL LOW (ref 8.9–10.3)
Chloride: 92 mmol/L — ABNORMAL LOW (ref 98–111)
Creatinine, Ser: 1.4 mg/dL — ABNORMAL HIGH (ref 0.44–1.00)
GFR calc Af Amer: 47 mL/min — ABNORMAL LOW (ref >60)
GFR calc non Af Amer: 40 mL/min — ABNORMAL LOW (ref >60)
Glucose, Bld: 115 mg/dL — ABNORMAL HIGH (ref 70–99)
Phosphorus: 3.9 mg/dL (ref 2.5–4.6)
Potassium: 5.1 mmol/L (ref 3.5–5.1)
Sodium: 121 mmol/L — ABNORMAL LOW (ref 135–145)

## 2018-03-27 LAB — CORTISOL: CORTISOL PLASMA: 15.7 ug/dL

## 2018-03-27 MED ORDER — ENOXAPARIN SODIUM 30 MG/0.3ML ~~LOC~~ SOLN
30.0000 mg | SUBCUTANEOUS | Status: DC
  Administered 2018-03-27 – 2018-04-01 (×6): 30 mg via SUBCUTANEOUS
  Filled 2018-03-27 (×6): qty 0.3

## 2018-03-27 NOTE — Consult Note (Signed)
Consultation Note Date: 03/27/2018   Patient Name: Renee Rice  DOB: [birth year]  MRN: [id number]  Age / Sex: 63 y.o., female  PCP: Forrest Moron, MD Referring Physician: Axel Filler, *  Reason for Consultation: Establishing goals of care and Psychosocial/spiritual support  HPI/Patient Profile: 63 y.o. female  with past medical history of chronic viral Hepatitis C with cirrhosis and worsening hyponatremia, hepatocellular carcinoma diagnosed fall 2019, depression,  admitted on 03/25/2018 with hyponatremia with excess extracellular fluid volume.   Clinical Assessment and Goals of Care: I have reviewed medical records including EPIC notes, labs and imaging, assessed the patient and then met at the bedside to discuss diagnosis prognosis, GOC, EOL wishes, disposition and options.  I introduced Palliative Medicine as specialized medical care for people living with serious illness. It focuses on providing relief from the symptoms and stress of a serious illness. The goal is to improve quality of life for both the patient and the family.  We discussed a brief life review of the patient. Renee Rice is a [place] native, but has spent some of her life in [place] living with her grandmother in "so I could have a horse" she smiles. She has been married to her husband [name] for 12 years in August.  She tells me that she wishes she had spent more time with her family.  Her mother and stepfather (he's my Dad) live nearby and sister [name] lives in [place].  She has a border collie at home she is looking forward to playing with in the sun. Renee Rice tells me that her husband [name] was a long distance truck driver, but he stopped workin gin October to help care for her. He has taken his SS and is planning to sell his truck. She shares that he is able and willing to care for her.   As far as functional and nutritional status, she  shares that she had been able to complete ADLs and IADLs until about 1-2 weeks ago,  She shares that she has leg weakness.  [name] states she feels that she cannot return to work.  I encourage her to consider where she wants to spend her time.   We discussed her current illness and what it means in the larger context of her on-going co-morbidities.  Natural disease trajectory and expectations at EOL were discussed.  I share a diagram of the chronic illness pathway, what is normal and expected.   I attempted to elicit values and goals of care important to the patient. [name] tells me that she would never want to live in a nursing home. She enjoys being outside and being with her family/pet.   The difference between aggressive medical intervention and comfort care was considered in light of the patient's goals of care. [name] tells me that she understands that she has medical treatments maximized.  She shares that her main concern at this time is "who do I call first?".  I share that with her current health concerns, her GI would always be first  contact.   Advanced directives, concepts specific to code status,  and rehospitalization were considered and discussed. At first Renee Rice states she talked with a doctor about code status and "of course" would want everything done to prolong her life.  We talk in detail about quality of life, "treat the treatable", chronic illness pathway.  I share that if we are doing full treatments and she gets sicker, if she survives CPR, I believe she would be weaker than before, we know she will still have all her other health issues, and she would likely need SNF placement. Renee Rice states that she feels "allowing a natural death" would be best, but asks for time to consider.  She tells me that her family would abide her wishes.  We talk about preferred place of death, it seems that she has not considered this, but at this time states hospital would be her preferred place of death.    Hospice and Palliative Care services outpatient were explained and offered.  Renee Rice is agreeable to outpatient Palliative services.  We discuss indicators to transition to Hospice services.  Renee Rice asks about prognosis, we talk about functional status, 3 hospitalizations in 6 months.  I share, "I don't thinks it's 2 years" to which she replies "me either".  I share that 3-6 months would not be surprising.   Questions and concerns were addressed.   The family was encouraged to call with questions or concerns. We plan for a follow up meeting Friday.   HCPOA    NEXT OF KIN - Renee Rice states her husband [name] and her sister [name] would make healthcare choices if she could not.    SUMMARY OF RECOMMENDATIONS   Considering code status Agreeable to out patient palliative services  Code Status/Advance Care Planning:  Full code  Symptom Management:   Per hospitalist, no additional needs at this time.   Palliative Prophylaxis:   Turn Reposition  Additional Recommendations (Limitations, Scope, Preferences):  Full Scope Treatment  Psycho-social/Spiritual:   Desire for further Chaplaincy support:no  Additional Recommendations: Caregiving  Support/Resources and Education on Hospice  Prognosis:    < 6 months, would not be surprising.   Discharge Planning: anticipate home with [facility] if qualifed.      Primary Diagnoses: Present on Admission: . Hyponatremia with excess extracellular fluid volume . Ascites . Hepatocellular carcinoma ([place]) . Other cirrhosis of liver ([place])   I have reviewed the medical record, interviewed the patient and family, and examined the patient. The following aspects are pertinent.  Past Medical History:  Diagnosis Date  . Allergic rhinitis   . Chronic viral hepatitis C ([place]) 04/27/2017   with cirrhosis  . Depression    in the past, no meds, no previous hospitalization   . Heart murmur    Social History   Socioeconomic History  . Marital status: Married     Spouse name: Not on file  . Number of children: 0  . Years of education: Not on file  . Highest education level: Not on file  Occupational History  . Occupation: Geophysicist/field seismologist, Hydrologist    Comment: full time   Social Needs  . Financial resource strain: Not on file  . Food insecurity:    Worry: Not on file    Inability: Not on file  . Transportation needs:    Medical: Not on file    Non-medical: Not on file  Tobacco Use  . Smoking status: Current Some Day Smoker    Packs/day: 0.25    Years:  30.00    Pack years: 7.50    Types: Cigarettes  . Smokeless tobacco: Never Used  Substance and Sexual Activity  . Alcohol use: Yes    Comment: occassional 2 beers a month; never heavy use  . Drug use: No    Comment: remote h/o IV drug use, none in decades   . Sexual activity: Not Currently  Lifestyle  . Physical activity:    Days per week: Not on file    Minutes per session: Not on file  . Stress: Not on file  Relationships  . Social connections:    Talks on phone: Not on file    Gets together: Not on file    Attends religious service: Not on file    Active member of club or organization: Not on file    Attends meetings of clubs or organizations: Not on file    Relationship status: Not on file  Other Topics Concern  . Not on file  Social History Narrative  . Not on file   Family History  Problem Relation Age of Onset  . Migraines Mother   . Cancer Father        biological father with lung to brain   . Migraines Maternal Grandmother    Scheduled Meds: . enoxaparin (LOVENOX) injection  30 mg Subcutaneous Q24H  . furosemide  40 mg Intravenous Q12H  . lactose free nutrition  237 mL Oral BID BM  . lactulose  20 g Oral TID  . rifaximin  550 mg Oral BID   Continuous Infusions: PRN Meds:.acetaminophen **OR** acetaminophen, ALPRAZolam, calcium carbonate, ondansetron **OR** ondansetron (ZOFRAN) IV Medications Prior to Admission:  Prior to Admission medications   Medication Sig  Start Date End Date Taking? Authorizing Provider  ALPRAZolam (XANAX) 0.25 MG tablet Take 1 tablet (0.25 mg total) by mouth at bedtime as needed for anxiety. Patient taking differently: Take 0.25 mg by mouth at bedtime as needed for anxiety or sleep.  02/25/18  Yes Truitt Merle, MD  aspirin-acetaminophen-caffeine (EXCEDRIN EXTRA STRENGTH) [phone] MG tablet Take 1-2 tablets by mouth every 6 (six) hours as needed for headache (or pain).    Yes [provider]  calcium carbonate (TUMS - DOSED IN MG ELEMENTAL CALCIUM) 500 MG chewable tablet Chew 1 tablet by mouth as needed for indigestion or heartburn.    Yes [provider]  cefdinir (OMNICEF) 300 MG capsule Take 1 capsule (300 mg total) by mouth 2 (two) times daily for 7 days. 03/20/18 03/27/18 Yes Stallings, Zoe A, MD  furosemide (LASIX) 40 MG tablet Take 80 mg by mouth daily.  03/19/18  Yes [provider]  lactose free nutrition (BOOST) LIQD Take 237 mLs by mouth 2 (two) times daily between meals.   Yes [provider]  lactulose (CHRONULAC) 10 GM/15ML solution Take 20 g by mouth 3 (three) times daily.   Yes [provider]  Multiple Vitamins-Calcium (ONE-A-DAY WOMENS PO) Take 1 tablet by mouth daily with breakfast.   Yes [provider]  naproxen sodium (ALEVE) 220 MG tablet Take 220-440 mg by mouth daily as needed (for pain).    Yes [provider]  sertraline (ZOLOFT) 50 MG tablet Take 1 tablet (50 mg total) by mouth daily. 01/11/18  Yes Truitt Merle, MD  traMADol (ULTRAM) 50 MG tablet Take 50 mg by mouth daily as needed for moderate pain.   Yes [provider]  XIFAXAN 550 MG TABS tablet Take 550 mg by mouth 2 (two) times daily.  02/26/18  Yes [provider]  lactulose (CEPHULAC) 10 g packet Take 1 packet (10 g total) by mouth 2 (two) times daily. Patient not taking: Reported on 03/25/2018 01/01/18   Doran Stabler, MD  spironolactone (ALDACTONE) 100 MG tablet Take 200 mg  by mouth daily.    [provider]  spironolactone (ALDACTONE) 50 MG tablet Please take 3 by mouth daily for a total of 142m a day Patient not taking: Reported on 03/25/2018 02/12/18   DDoran Stabler MD   No Known Allergies Review of Systems  Unable to perform ROS: Other    Physical Exam Vitals signs and nursing note reviewed.  HENT:     Head: Normocephalic and atraumatic.  Cardiovascular:     Rate and Rhythm: Normal rate.  Pulmonary:     Effort: Pulmonary effort is normal. No respiratory distress.  Abdominal:     General: There is distension.     Palpations: Abdomen is soft.  Skin:    General: Skin is warm and dry.  Neurological:     Mental Status: She is alert and oriented to person, place, and time.  Psychiatric:        Mood and Affect: Mood normal.        Behavior: Behavior normal.     Vital Signs: BP 115/61 (BP Location: Left Arm)   Pulse 90   Temp 97.9 F (36.6 C) (Oral)   Resp 17   Ht _0  (1.803 m)   Wt 86.7 kg   SpO2 91%   BMI 26.66 kg/m  Pain Scale: 0-10   Pain Score: 0-No pain   SpO2: SpO2: 91 % O2 Device:SpO2: 91 % O2 Flow Rate: .O2 Flow Rate (L/min): 2 L/min  IO: Intake/output summary:   Intake/Output Summary (Last 24 hours) at 03/27/2018 1339 Last data filed at 03/27/2018 1211 Gross per 24 hour  Intake -  Output 877 ml  Net -877 ml    LBM: Last BM Date: 03/26/18 Baseline Weight: Weight: 86.2 kg Most recent weight: Weight: 86.7 kg     Palliative Assessment/Data:   Flowsheet Rows     Most Recent Value  Intake Tab  Referral Department  Hospitalist  Unit at Time of Referral  Other (Comment) [[facility]]  Palliative Care Primary Diagnosis  Other (Comment) [cirrhosis]  Date Notified  03/26/18  Palliative Care Type  New Palliative care  Reason for referral  Clarify Goals of Care  Date of Admission  03/25/18  Date first seen by Palliative Care  03/27/18  # of days Palliative referral response time  1 Day(s)  # of days IP  prior to Palliative referral  1  Clinical Assessment  Palliative Performance Scale Score  40%  Pain Max last 24 hours  Not able to report  Pain Min Last 24 hours  Not able to report  Dyspnea Max Last 24 Hours  Not able to report  Dyspnea Min Last 24 hours  Not able to report  Psychosocial & Spiritual Assessment  Palliative Care Outcomes      Time In: 1330 Time Out: 1425 Time Total: 55 minutes Greater than 50%  of this time was spent counseling and coordinating care related to the above assessment and plan.  Signed by: TDrue Novel NP   Please contact Palliative Medicine Team phone at [phone] questions and concerns.  For individual provider: See AShea Evans

## 2018-03-27 NOTE — Progress Notes (Signed)
[facility] ROUNDING NOTE   Subjective:   Brief history this is a 63 year old lady with cirrhosis hepatitis C complicated by hepatocellular carcinoma admitted with worsening hyponatremia.  She has been drinking copious amounts of fluids.  She is volume overloaded with tense ascites and lower extremity edema.  We were consulted due to a drop in her sodium to 118.  There also appears to be an increase in the serum creatinine which is increased from baseline of 0.6-1.2.  Blood pressure 115/61 pulse of 90 temperature 97.9 O2 sats 85% room air  Sodium 119 potassium 4.8 chloride 92 CO2 19 BUN 34 creatinine 1.29 glucose 156 calcium 8.2.  Hemoglobin 11.5 WBC 11.2 platelets 64  Urine output 1000 cc 03/26/2018    Medications rifaximin 550 mg twice daily, Lasix 40 mg every 12 hours, Xanax 0.25 mg as needed Tums, lactulose 20 g 3 times daily, Lovenox subcu every 24 hours,  Fluid restriction 1200 cc.  Renal ultrasound 03/27/2018- renal ultrasound.  Chest x-ray revealed small to moderate left-sided pleural effusion left basilar opacities.  Objective:  Vital signs in last 24 hours:  Temp:  [97.6 F (36.4 C)-97.9 F (36.6 C)] 97.9 F (36.6 C) (03/11 0425) Pulse Rate:  [90-93] 90 (03/11 0900) Resp:  [17-18] 17 (03/11 0900) BP: (111-115)/(61-70) 115/61 (03/11 0836) SpO2:  [91 %-95 %] 91 % (03/11 0900) Weight:  [86.7 kg] 86.7 kg (03/11 0425)  Weight change: 0.517 kg Filed Weights   03/25/18 2224 03/26/18 0500 03/27/18 0425  Weight: 86.2 kg 86.3 kg 86.7 kg    Intake/Output: I/O last 3 completed shifts: In: 27 [P.O.:640; IV Piggyback:350] Out: 1451 [Urine:1301; Emesis/NG output:150]   Intake/Output this shift:  Total I/O In: -  Out: 426 [Urine:425; Stool:1] Alert nondistressed CVS- RRR no murmurs rubs gallops RS- CTA no wheezes rales ABD- BS present soft non-distended abdominal ascites EXT- 1-2+ pitting edema   Basic Metabolic Panel: Recent Labs  Lab 03/26/18 1931  03/26/18 2332 03/27/18 0409 03/27/18 0709 03/27/18 1059  NA 119* 119* 121* 119* 119*  K 4.7 4.9 5.1 5.0 4.8  CL 91* 92* 92* 90* 92*  CO2 20* 17* 23 22 19*  GLUCOSE 152* 133* 115* 105* 156*  BUN 33* 32* 33* 32* 34*  CREATININE 1.39* 1.29* 1.40* 1.31* 1.29*  CALCIUM 8.4* 8.2* 8.6* 8.5* 8.2*  PHOS  --   --  3.9  --   --     Liver Function Tests: Recent Labs  Lab 03/22/18 1611 03/25/18 1502 03/26/18 0412 03/27/18 0409  AST 122* 125* 123*  --   ALT 86* 87* 84*  --   ALKPHOS 146* 142* 144*  --   BILITOT 8.2* 8.7* 7.6*  --   PROT 5.9* 6.0* 5.5*  --   ALBUMIN 2.1* 2.0* 1.9* 1.9*   No results for input(s): LIPASE, AMYLASE in the last 168 hours. No results for input(s): AMMONIA in the last 168 hours.  CBC: Recent Labs  Lab 03/22/18 1610 03/25/18 1502 03/26/18 0412  WBC 9.7 11.4* 11.2*  NEUTROABS 7.3  --   --   HGB 11.6* 12.4 11.5*  HCT 33.3* 34.3* 30.7*  MCV 97.1 93.2 90.8  PLT 82* 76* 64*    Cardiac Enzymes: No results for input(s): CKTOTAL, CKMB, CKMBINDEX, TROPONINI in the last 168 hours.  BNP: Invalid input(s): POCBNP  CBG: No results for input(s): GLUCAP in the last 168 hours.  Microbiology: Results for orders placed or performed during the hospital encounter of 03/25/18  Blood culture (routine  x 2)     Status: None (Preliminary result)   Collection Time: 03/26/18  7:30 AM  Result Value Ref Range Status   Specimen Description BLOOD LEFT HAND  Final   Special Requests   Final    BOTTLES DRAWN AEROBIC ONLY Blood Culture adequate volume   Culture   Final    NO GROWTH 1 DAY Performed at [facility], [place] [address], [place], [place] [address]    Report Status PENDING  Incomplete  Blood culture (routine x 2)     Status: None (Preliminary result)   Collection Time: 03/26/18  7:36 AM  Result Value Ref Range Status   Specimen Description BLOOD RIGHT ANTECUBITAL  Final   Special Requests   Final    BOTTLES DRAWN AEROBIC ONLY Blood Culture adequate  volume   Culture   Final    NO GROWTH 1 DAY Performed at [facility], [place] [address], [place], [place] [address]    Report Status PENDING  Incomplete    Coagulation Studies: Recent Labs    03/26/18 0412  LABPROT 24.2*  INR 2.2*    Urinalysis: Recent Labs    03/25/18 1930  COLORURINE STRAW*  LABSPEC 1.006  PHURINE 7.0  GLUCOSEU NEGATIVE  HGBUR SMALL*  BILIRUBINUR NEGATIVE  KETONESUR NEGATIVE  PROTEINUR NEGATIVE  NITRITE NEGATIVE  LEUKOCYTESUR SMALL*      Imaging: Dg Chest 2 View  Result Date: 03/25/2018 CLINICAL DATA:  63 y/o F; hyponatremia. Cough, shortness of breath, chest pain. EXAM: CHEST - 2 VIEW COMPARISON:  03/20/2018 chest radiograph FINDINGS: Stable cardiac silhouette within normal limits given projection and technique. Stable small to moderate left-sided pleural effusion and left basilar opacity. No new consolidation or pneumothorax. Bones are unremarkable. IMPRESSION: Stable small to moderate left-sided pleural effusion and left basilar opacity which may represent associated atelectasis or pneumonia. Electronically Signed   By: Kristine Garbe M.D.   On: 03/25/2018 18:24   US Renal  Result Date: 03/27/2018 CLINICAL DATA:  Acute kidney failure EXAM: RENAL / URINARY TRACT ULTRASOUND COMPLETE COMPARISON:  Abdominal MRI 01/24/2018 FINDINGS: Right Kidney: Renal measurements: 11.6 x 4.1 x 4.7 cm = volume: 113 mL . Echogenicity within normal limits. No mass or hydronephrosis visualized. Left Kidney: Renal measurements: 10.7 x 4.2 x 4.8 cm = volume: 116 mL. Echogenicity within normal limits. No mass or hydronephrosis visualized. Bladder: Appears normal for degree of bladder distention. Known ascites and cirrhosis. IMPRESSION: Negative renal ultrasound. Electronically Signed   By: Monte Fantasia M.D.   On: 03/27/2018 05:26     Medications:    . enoxaparin (LOVENOX) injection  30 mg Subcutaneous Q24H  . furosemide  40 mg Intravenous Q12H  . lactose  free nutrition  237 mL Oral BID BM  . lactulose  20 g Oral TID  . rifaximin  550 mg Oral BID   acetaminophen **OR** acetaminophen, ALPRAZolam, calcium carbonate, ondansetron **OR** ondansetron (ZOFRAN) IV  Assessment/ Plan:   Hyponatremia hypovolemic hyponatremia appears to be a clinical diagnosis in the setting of cirrhosis.  TSH slightly elevated at 7.212.  A.m. cortisol 15.7.  Urine sodium 54 urine osmolality 219.  The problem with these indices appears to be the use of Lasix.  They do not reflect the true etiology of the hyponatremia although clinically I think that we can safely say the patient is volume overloaded and in need of diuresis.  As the patient may be intravascularly dry but total body water overloaded, there may be some  reflection in terms of worsening renal insufficiency as we continue to diurese.. This would be expected.  At this time would like to continue IV diuresis with Lasix this should help promote free water excretion and allow [place] to maintain fluid restriction.  Chronic kidney disease stage III may be some acute changes type II possible hepatorenal syndrome continue to avoid nephrotoxic agents ACE inhibitor's ARB Cox 2 inhibitors nonsteroidal anti-drugs and IV contrast renal ultrasound reveals no evidence of hydronephrosis.  Urine sodium was 54.  Urinalysis was negative for protein negative for white cells negative for red blood cells.  Anemia not an issue at this time  Hepatitis C with cirrhosis and ascites not a candidate for liver transplantation or TIPS procedure rapid decompensation liver disease multiple times centesis since November 2019 potential very poor prognosis  Hepatocellular carcinoma status post radiation embolization   LOS: 2 Renee Rice @TODAY @2 :55 PM

## 2018-03-27 NOTE — Progress Notes (Signed)
   Subjective: Renee Rice reports feeling better today. She had vomited once yesterday after a coughing spell but states she felt better after. She feels less swollen and distended today. She states she has been restricting her fluid intake. She reports good urine output. She is amenable to meeting with a palliative doctor. All questions and concerns addressed.   Objective:  Vital signs in last 24 hours: Vitals:   03/26/18 0900 03/26/18 1411 03/26/18 2038 03/27/18 0425  BP: 117/73 117/85 111/61 115/70  Pulse: 95 88 90 90  Resp: (!) 23 17 17 18   Temp:  97.7 F (36.5 C) 97.6 F (36.4 C) 97.9 F (36.6 C)  TempSrc: Oral Oral  Oral  SpO2: 92% 91% 95% 92%  Weight:    86.7 kg  Height:       Physical Exam Gen: seen laying in bed comfortably, no distress Abdomen: soft, distended, bowel sounds present, no ttp  Ext: bilateral 2+ pitting edema, warm and well perfused Skin: jaundiced, warm and dry  Assessment/Plan:  Principal Problem:   Hyponatremia with excess extracellular fluid volume Active Problems:   Ascites   Other cirrhosis of liver (HCC)   Hepatocellular carcinoma (HCC)   Renee Rice ins a 30yoF with chronic hep C complicated by cirrhosis and hepatocellular carcinomapresenting with worsening hypervolemic hyponatremia and left sided pleural effusion.  Hypervolemic Hyponatremia Cirrhosis 2/2 Chronic Hep C: Overall presentation concerning for decompensated cirrhosis.  Hep C treatment has been delayed due to [facility] diagnosis.MELD score 31, MR in Jan showed esophageal varices. She is not a candidate for TIPS or transplant. - Consulted nephrology and palliative care, appreciate recommendations  - Na 121 today - Continue IV lasix 40mg per nephro - Continue home lactulose, rifaximin  - Consider IR consult for paracentesis and possible left thoracentesis - If blood pressure decreases, will consider use ofalbumin andmidodrine so we can continue diuresis  AKI CKD III: Patient  has had an increase in Cr to 1.40, baseline has been closer to 1. Concerning for hepatorenal syndrome. Nephro on board, appreciate recommendations   Hepatocellular Carcinoma:Oncologist Dr. Burr Medico, hepatocellular cancer diagnosed in June 2019, [place] in October followed by decompensated liver cirrhosis and the need for recurrent paracenteses. Not a candidate for additional Y 90. May start systemic therapy if her disease progresses  Dispo: Anticipated discharge pending clinical improvement.   Mike Craze, DO 03/27/2018, 7:19 AM Pager: [phone]

## 2018-03-27 NOTE — Progress Notes (Signed)
Critical Na 119, Intern paged, no new orders.

## 2018-03-28 LAB — RENAL FUNCTION PANEL
Albumin: 1.6 g/dL — ABNORMAL LOW (ref 3.5–5.0)
Anion gap: 11 (ref 5–15)
BUN: 37 mg/dL — AB (ref 8–23)
CO2: 18 mmol/L — ABNORMAL LOW (ref 22–32)
CREATININE: 1.43 mg/dL — AB (ref 0.44–1.00)
Calcium: 8.1 mg/dL — ABNORMAL LOW (ref 8.9–10.3)
Chloride: 91 mmol/L — ABNORMAL LOW (ref 98–111)
GFR calc Af Amer: 46 mL/min — ABNORMAL LOW (ref >60)
GFR calc non Af Amer: 39 mL/min — ABNORMAL LOW (ref >60)
Glucose, Bld: 163 mg/dL — ABNORMAL HIGH (ref 70–99)
Phosphorus: 3.6 mg/dL (ref 2.5–4.6)
Potassium: 4.8 mmol/L (ref 3.5–5.1)
Sodium: 120 mmol/L — ABNORMAL LOW (ref 135–145)

## 2018-03-28 NOTE — Progress Notes (Signed)
Student nurse Jillyn Ledger) notified RN of "bloody, mucous stool".  RN notified Dr Laural Golden.  Dr Laural Golden stated she would order a CBC for the morning. Will continue to monitor.

## 2018-03-28 NOTE — Progress Notes (Signed)
   Subjective: Renee Rice reports feeling okay this morning. She states she is having 2-3/10 abdominal pain, nausea and vomiting. She states she is having frequent BM due to the close timing of her lactulose doses. She states she continues to feel less distended and less swelling in her LE. She states she had a good discussion with palliative care yesterday. All questions and concerns addressed.    Objective:  Vital signs in last 24 hours: Vitals:   03/27/18 0900 03/27/18 1300 03/27/18 2137 03/28/18 0513  BP:  110/63 111/66 108/75  Pulse: 90  96 (!) 47  Resp: '17  19 19  '$ Temp:  97.6 F (36.4 C) (!) 97.5 F (36.4 C) (!) 97.3 F (36.3 C)  TempSrc:  Oral Oral Oral  SpO2: 91%  94% 93%  Weight:    84.2 kg  Height:       Physical Exam Gen: seen comfortably laying in bed, no distress Abdomen: distended, soft, no ttp Ext: bilateral 2+ pitting edema, warm and well perfused   Assessment/Plan:  Principal Problem:   Hyponatremia with excess extracellular fluid volume Active Problems:   Ascites   Other cirrhosis of liver (HCC)   Hepatocellular carcinoma (HCC)   Goals of care, counseling/discussion   Palliative care by specialist   DNR (do not resuscitate) discussion  Renee Rice ins a 47yoF with chronic hep C complicated by cirrhosis and hepatocellular carcinomapresenting with worsening hypervolemic hyponatremia and left sided pleural effusion.  Hypervolemic Hyponatremia Cirrhosis2/2Chronic Hep C:Overall presentation concerning for decompensated cirrhosis.Hep C treatment has been delayed due to [facility] diagnosis.MELD score 31, MR in Jan showed esophageal varices.She is not a candidate for TIPS or transplant. - Consultednephrology and palliative care, appreciate recommendations  - Na 120 today -Continue IV lasix '40mg'$  -Continue home lactulose, rifaximin   GOC: Patient met with palliative care yesterday and discussed goals of care. She is considering her code status and  agreeable to outpatient palliative services. She understands she is maximized on medical treatments.   AKI CKD III: Patient has had an increase in Cr to 1.43, baseline has been closer to 1. Concerning for hepatorenal syndrome. Nephro on board, appreciate recommendations    Dispo: Patient will need to be set up with outpatient palliative services before discharge.   Mike Craze, DO 03/28/2018, 6:53 AM Pager: [phone]

## 2018-03-28 NOTE — Progress Notes (Signed)
[facility] ROUNDING NOTE   Subjective:   Brief history this is a 63 year old lady with cirrhosis hepatitis C complicated by hepatocellular carcinoma admitted with worsening hyponatremia.  She has been drinking copious amounts of fluids.  She is volume overloaded with tense ascites and lower extremity edema.  We were consulted due to a drop in her sodium to 118.  There also appears to be an increase in the serum creatinine which is increased from baseline of 0.6-1.2.  Blood pressure 98/55 pulse 93 temperature 97.3  Sodium 120 potassium 4.8 chloride 91 CO2 18 BUN 37 creatinine 1.43 glucose 163 calcium 8.1 phosphorus 3.6 Albumin 1.6   Urine output 427 cc 03/27/2018  Medications rifaximin 550 mg twice daily, Lasix 40 mg every 12 hours, Xanax 0.25 mg as needed Tums, lactulose 20 g 3 times daily, Lovenox subcu every 24 hours,  Fluid restriction 1200 cc.  Renal ultrasound 03/27/2018- renal ultrasound.  Chest x-ray revealed small to moderate left-sided pleural effusion left basilar opacities.  Objective:  Vital signs in last 24 hours:  Temp:  [97.3 F (36.3 C)-97.6 F (36.4 C)] 97.3 F (36.3 C) (03/12 0513) Pulse Rate:  [47-96] 47 (03/12 0513) Resp:  [19] 19 (03/12 0513) BP: (108-111)/(63-75) 108/75 (03/12 0513) SpO2:  [93 %-94 %] 93 % (03/12 0513) Weight:  [84.2 kg] 84.2 kg (03/12 0513)  Weight change: -2.5 kg Filed Weights   03/26/18 0500 03/27/18 0425 03/28/18 0513  Weight: 86.3 kg 86.7 kg 84.2 kg    Intake/Output: I/O last 3 completed shifts: In: 900 [P.O.:900] Out: 427 [Urine:426; Stool:1]   Intake/Output this shift:  Total I/O In: -  Out: 300 [Urine:300] Alert nondistressed CVS- RRR no murmurs rubs gallops RS- CTA no wheezes rales ABD- BS present soft non-distended abdominal ascites EXT- 1-2+ pitting edema   Basic Metabolic Panel: Recent Labs  Lab 03/26/18 2332 03/27/18 0409 03/27/18 0709 03/27/18 1059 03/28/18 0930  NA 119* 121* 119* 119*  120*  K 4.9 5.1 5.0 4.8 4.8  CL 92* 92* 90* 92* 91*  CO2 17* 23 22 19* 18*  GLUCOSE 133* 115* 105* 156* 163*  BUN 32* 33* 32* 34* 37*  CREATININE 1.29* 1.40* 1.31* 1.29* 1.43*  CALCIUM 8.2* 8.6* 8.5* 8.2* 8.1*  PHOS  --  3.9  --   --  3.6    Liver Function Tests: Recent Labs  Lab 03/22/18 1611 03/25/18 1502 03/26/18 0412 03/27/18 0409 03/28/18 0930  AST 122* 125* 123*  --   --   ALT 86* 87* 84*  --   --   ALKPHOS 146* 142* 144*  --   --   BILITOT 8.2* 8.7* 7.6*  --   --   PROT 5.9* 6.0* 5.5*  --   --   ALBUMIN 2.1* 2.0* 1.9* 1.9* 1.6*   No results for input(s): LIPASE, AMYLASE in the last 168 hours. No results for input(s): AMMONIA in the last 168 hours.  CBC: Recent Labs  Lab 03/22/18 1610 03/25/18 1502 03/26/18 0412  WBC 9.7 11.4* 11.2*  NEUTROABS 7.3  --   --   HGB 11.6* 12.4 11.5*  HCT 33.3* 34.3* 30.7*  MCV 97.1 93.2 90.8  PLT 82* 76* 64*    Cardiac Enzymes: No results for input(s): CKTOTAL, CKMB, CKMBINDEX, TROPONINI in the last 168 hours.  BNP: Invalid input(s): POCBNP  CBG: No results for input(s): GLUCAP in the last 168 hours.  Microbiology: Results for orders placed or performed during the hospital encounter of 03/25/18  Blood  culture (routine x 2)     Status: None (Preliminary result)   Collection Time: 03/26/18  7:30 AM  Result Value Ref Range Status   Specimen Description BLOOD LEFT HAND  Final   Special Requests   Final    BOTTLES DRAWN AEROBIC ONLY Blood Culture adequate volume   Culture   Final    NO GROWTH 2 DAYS Performed at [facility], [address], [place], [place] [address]    Report Status PENDING  Incomplete  Blood culture (routine x 2)     Status: None (Preliminary result)   Collection Time: 03/26/18  7:36 AM  Result Value Ref Range Status   Specimen Description BLOOD RIGHT ANTECUBITAL  Final   Special Requests   Final    BOTTLES DRAWN AEROBIC ONLY Blood Culture adequate volume   Culture   Final    NO GROWTH 2  DAYS Performed at [facility], [place] [address], [place], [place] [address]    Report Status PENDING  Incomplete    Coagulation Studies: Recent Labs    03/26/18 0412  LABPROT 24.2*  INR 2.2*    Urinalysis: Recent Labs    03/25/18 1930  COLORURINE STRAW*  LABSPEC 1.006  PHURINE 7.0  GLUCOSEU NEGATIVE  HGBUR SMALL*  BILIRUBINUR NEGATIVE  KETONESUR NEGATIVE  PROTEINUR NEGATIVE  NITRITE NEGATIVE  LEUKOCYTESUR SMALL*      Imaging: US Renal  Result Date: 03/27/2018 CLINICAL DATA:  Acute kidney failure EXAM: RENAL / URINARY TRACT ULTRASOUND COMPLETE COMPARISON:  Abdominal MRI 01/24/2018 FINDINGS: Right Kidney: Renal measurements: 11.6 x 4.1 x 4.7 cm = volume: 113 mL . Echogenicity within normal limits. No mass or hydronephrosis visualized. Left Kidney: Renal measurements: 10.7 x 4.2 x 4.8 cm = volume: 116 mL. Echogenicity within normal limits. No mass or hydronephrosis visualized. Bladder: Appears normal for degree of bladder distention. Known ascites and cirrhosis. IMPRESSION: Negative renal ultrasound. Electronically Signed   By: Monte Fantasia M.D.   On: 03/27/2018 05:26     Medications:    . enoxaparin (LOVENOX) injection  30 mg Subcutaneous Q24H  . furosemide  40 mg Intravenous Q12H  . lactose free nutrition  237 mL Oral BID BM  . lactulose  20 g Oral TID  . rifaximin  550 mg Oral BID   acetaminophen **OR** acetaminophen, ALPRAZolam, calcium carbonate, ondansetron **OR** ondansetron (ZOFRAN) IV  Assessment/ Plan:   Hyponatremia hypovolemic hyponatremia appears to be a clinical diagnosis in the setting of cirrhosis.  TSH slightly elevated at 7.212.  A.m. cortisol 15.7.  Urine sodium 54 urine osmolality 219.  The problem with these indices appears to be the use of Lasix.  They do not reflect the true etiology of the hyponatremia although clinically I think that we can safely say the patient is volume overloaded and in need of diuresis.  As the patient may be  intravascularly dry but total body water overloaded, there may be some reflection in terms of worsening renal insufficiency as we continue to diurese.. This would be expected.  At this time would like to continue IV diuresis with Lasix this should help promote free water excretion and allow [place] to maintain fluid restriction.  Sodium seems to be slowly improving we will continue with diuresis and water restriction.  Chronic kidney disease stage III may be some acute changes type II possible hepatorenal syndrome continue to avoid nephrotoxic agents ACE inhibitor's ARB Cox 2 inhibitors nonsteroidal anti-drugs and IV contrast renal ultrasound reveals no evidence  of hydronephrosis.  Urine sodium was 54.  Urinalysis was negative for protein negative for white cells negative for red blood cells.  Anemia not an issue at this time  Hepatitis C with cirrhosis and ascites not a candidate for liver transplantation or TIPS procedure rapid decompensation liver disease multiple times centesis since November 2019 potential very poor prognosis  Hepatocellular carcinoma status post radiation embolization   LOS: [place] @TODAY @12 :37 PM

## 2018-03-29 LAB — RENAL FUNCTION PANEL
ALBUMIN: 1.8 g/dL — AB (ref 3.5–5.0)
Anion gap: 9 (ref 5–15)
BUN: 40 mg/dL — ABNORMAL HIGH (ref 8–23)
CHLORIDE: 92 mmol/L — AB (ref 98–111)
CO2: 22 mmol/L (ref 22–32)
Calcium: 8.6 mg/dL — ABNORMAL LOW (ref 8.9–10.3)
Creatinine, Ser: 1.61 mg/dL — ABNORMAL HIGH (ref 0.44–1.00)
GFR calc Af Amer: 40 mL/min — ABNORMAL LOW (ref >60)
GFR calc non Af Amer: 34 mL/min — ABNORMAL LOW (ref >60)
Glucose, Bld: 112 mg/dL — ABNORMAL HIGH (ref 70–99)
POTASSIUM: 5.5 mmol/L — AB (ref 3.5–5.1)
Phosphorus: 4.2 mg/dL (ref 2.5–4.6)
Sodium: 123 mmol/L — ABNORMAL LOW (ref 135–145)

## 2018-03-29 LAB — OSMOLALITY, URINE: Osmolality, Ur: 344 mOsm/kg (ref 300–900)

## 2018-03-29 LAB — URINALYSIS, ROUTINE W REFLEX MICROSCOPIC
Bilirubin Urine: NEGATIVE
Glucose, UA: NEGATIVE mg/dL
HGB URINE DIPSTICK: NEGATIVE
Ketones, ur: NEGATIVE mg/dL
Leukocytes,Ua: NEGATIVE
Nitrite: NEGATIVE
Protein, ur: NEGATIVE mg/dL
Specific Gravity, Urine: 1.012 (ref 1.005–1.030)
pH: 5 (ref 5.0–8.0)

## 2018-03-29 LAB — CBC
HCT: 30.9 % — ABNORMAL LOW (ref 36.0–46.0)
Hemoglobin: 11.5 g/dL — ABNORMAL LOW (ref 12.0–15.0)
MCH: 33.7 pg (ref 26.0–34.0)
MCHC: 37.2 g/dL — ABNORMAL HIGH (ref 30.0–36.0)
MCV: 90.6 fL (ref 80.0–100.0)
Platelets: 59 10*3/uL — ABNORMAL LOW (ref 150–400)
RBC: 3.41 MIL/uL — ABNORMAL LOW (ref 3.87–5.11)
RDW: 16 % — ABNORMAL HIGH (ref 11.5–15.5)
WBC: 16.3 10*3/uL — ABNORMAL HIGH (ref 4.0–10.5)
nRBC: 0.2 % (ref 0.0–0.2)

## 2018-03-29 LAB — SODIUM, URINE, RANDOM: Sodium, Ur: <10 mmol/L

## 2018-03-29 MED ORDER — SODIUM ZIRCONIUM CYCLOSILICATE 10 G PO PACK
10.0000 g | PACK | Freq: Two times a day (BID) | ORAL | Status: DC
  Administered 2018-03-29 – 2018-04-01 (×7): 10 g via ORAL
  Filled 2018-03-29 (×8): qty 1

## 2018-03-29 MED ORDER — LACTULOSE 10 GM/15ML PO SOLN
20.0000 g | Freq: Two times a day (BID) | ORAL | Status: DC
  Administered 2018-03-29 – 2018-03-30 (×3): 20 g via ORAL
  Filled 2018-03-29 (×4): qty 30

## 2018-03-29 NOTE — Progress Notes (Signed)
SATURATION QUALIFICATIONS: (This note is used to comply with regulatory documentation for home oxygen)  Patient Saturations on Room Air at Rest = 91%  Patient Saturations on Room Air while Ambulating = 86%  Patient Saturations on 2 Liters of oxygen while Ambulating = 92%  Please briefly explain why patient needs home oxygen: Hypoxia with mobility on Room Air.   Magda Kiel, [place] [phone] 03/29/2018

## 2018-03-29 NOTE — Progress Notes (Signed)
[facility])  Received call from RN Case Manager Hassan Rowan, to assist in setting up palliative care services at home once discharged.    Information obtained, sent to our palliative intake specialist, who will follow up to get the order from her PCP.  Thank you, Venia Carbon RN, BSN, [facility] Liaison  [phone]

## 2018-03-29 NOTE — Progress Notes (Signed)
[facility] ROUNDING NOTE   Subjective:   Brief history this is a 63 year old lady with cirrhosis hepatitis C complicated by hepatocellular carcinoma admitted with worsening hyponatremia.  She has been drinking copious amounts of fluids.  She is volume overloaded with tense ascites and lower extremity edema.  We were consulted due to a drop in her sodium to 118.  There also appears to be an increase in the serum creatinine which is increased from baseline of 0.6-1.2.  Blood pressure 106/53 pulse 86 temperature 97.5 O2 sats 95% room air   Sodium 123 potassium 5.5 chloride 92 CO2 22 BUN 40 creatinine 1.6 glucose 112 calcium 8.6 WBC 16.3 hemoglobin 11.5 platelets 59  Urine output 300 cc 03/28/2011 weight 84.9 kg admission weight 86.2 kg  Medications rifaximin 550 mg twice daily, Lasix 40 mg every 12 hours, Xanax 0.25 mg as needed Tums, lactulose 20 g 3 times daily, Lovenox subcu every 24 hours,  Fluid restriction 1200 cc.  Renal ultrasound 03/27/2018- renal ultrasound.  Chest x-ray revealed small to moderate left-sided pleural effusion left basilar opacities.  Objective:  Vital signs in last 24 hours:  Temp:  [97.5 F (36.4 C)-98.2 F (36.8 C)] 97.5 F (36.4 C) (03/13 0527) Pulse Rate:  [76-95] 76 (03/13 0527) Resp:  [15-19] 19 (03/13 0527) BP: (100-110)/(53-72) 106/53 (03/13 0527) SpO2:  [95 %-99 %] 95 % (03/13 0527) Weight:  [84.9 kg] 84.9 kg (03/13 0527)  Weight change: 0.668 kg Filed Weights   03/27/18 0425 03/28/18 0513 03/29/18 0527  Weight: 86.7 kg 84.2 kg 84.9 kg    Intake/Output: I/O last 3 completed shifts: In: 9562 [P.O.:1014] Out: 600 [Urine:600]   Intake/Output this shift:  No intake/output data recorded. Alert nondistressed CVS- RRR no murmurs rubs gallops RS- CTA no wheezes rales ABD- BS present soft non-distended abdominal ascites EXT- 1-2+ pitting edema   Basic Metabolic Panel: Recent Labs  Lab 03/27/18 0409 03/27/18 0709  03/27/18 1059 03/28/18 0930 03/29/18 0405  NA 121* 119* 119* 120* 123*  K 5.1 5.0 4.8 4.8 5.5*  CL 92* 90* 92* 91* 92*  CO2 23 22 19* 18* 22  GLUCOSE 115* 105* 156* 163* 112*  BUN 33* 32* 34* 37* 40*  CREATININE 1.40* 1.31* 1.29* 1.43* 1.61*  CALCIUM 8.6* 8.5* 8.2* 8.1* 8.6*  PHOS 3.9  --   --  3.6 4.2    Liver Function Tests: Recent Labs  Lab 03/22/18 1611 03/25/18 1502 03/26/18 0412 03/27/18 0409 03/28/18 0930 03/29/18 0405  AST 122* 125* 123*  --   --   --   ALT 86* 87* 84*  --   --   --   ALKPHOS 146* 142* 144*  --   --   --   BILITOT 8.2* 8.7* 7.6*  --   --   --   PROT 5.9* 6.0* 5.5*  --   --   --   ALBUMIN 2.1* 2.0* 1.9* 1.9* 1.6* 1.8*   No results for input(s): LIPASE, AMYLASE in the last 168 hours. No results for input(s): AMMONIA in the last 168 hours.  CBC: Recent Labs  Lab 03/22/18 1610 03/25/18 1502 03/26/18 0412 03/29/18 0405  WBC 9.7 11.4* 11.2* 16.3*  NEUTROABS 7.3  --   --   --   HGB 11.6* 12.4 11.5* 11.5*  HCT 33.3* 34.3* 30.7* 30.9*  MCV 97.1 93.2 90.8 90.6  PLT 82* 76* 64* 59*    Cardiac Enzymes: No results for input(s): CKTOTAL, CKMB, CKMBINDEX, TROPONINI in the last  168 hours.  BNP: Invalid input(s): POCBNP  CBG: No results for input(s): GLUCAP in the last 168 hours.  Microbiology: Results for orders placed or performed during the hospital encounter of 03/25/18  Blood culture (routine x 2)     Status: None (Preliminary result)   Collection Time: 03/26/18  7:30 AM  Result Value Ref Range Status   Specimen Description BLOOD LEFT HAND  Final   Special Requests   Final    BOTTLES DRAWN AEROBIC ONLY Blood Culture adequate volume   Culture   Final    NO GROWTH 2 DAYS Performed at [facility], [place] [address], [place], [place] [address]    Report Status PENDING  Incomplete  Blood culture (routine x 2)     Status: None (Preliminary result)   Collection Time: 03/26/18  7:36 AM  Result Value Ref Range Status   Specimen  Description BLOOD RIGHT ANTECUBITAL  Final   Special Requests   Final    BOTTLES DRAWN AEROBIC ONLY Blood Culture adequate volume   Culture   Final    NO GROWTH 2 DAYS Performed at [facility], [place] [address], [place], [place] [address]    Report Status PENDING  Incomplete    Coagulation Studies: No results for input(s): LABPROT, INR in the last 72 hours.  Urinalysis: Recent Labs    03/29/18 0200  COLORURINE AMBER*  LABSPEC 1.012  PHURINE 5.0  GLUCOSEU NEGATIVE  HGBUR NEGATIVE  BILIRUBINUR NEGATIVE  KETONESUR NEGATIVE  PROTEINUR NEGATIVE  NITRITE NEGATIVE  LEUKOCYTESUR NEGATIVE      Imaging: No results found.   Medications:    . enoxaparin (LOVENOX) injection  30 mg Subcutaneous Q24H  . furosemide  40 mg Intravenous Q12H  . lactose free nutrition  237 mL Oral BID BM  . lactulose  20 g Oral BID  . rifaximin  550 mg Oral BID  . sodium zirconium cyclosilicate  10 g Oral BID   acetaminophen **OR** acetaminophen, ALPRAZolam, calcium carbonate, ondansetron **OR** ondansetron (ZOFRAN) IV  Assessment/ Plan:   Hyponatremia hypovolemic hyponatremia appears to be a clinical diagnosis in the setting of cirrhosis.  TSH slightly elevated at 7.212.  A.m. cortisol 15.7.  Urine sodium 54 urine osmolality 219.  The problem with these indices appears to be the use of Lasix.  They do not reflect the true etiology of the hyponatremia although clinically I think that we can safely say the patient is volume overloaded and in need of diuresis.  As the patient may be intravascularly dry but total body water overloaded, there may be some reflection in terms of worsening renal insufficiency as we continue to diurese.. This would be expected.  At this time would like to continue IV diuresis with Lasix this should help promote free water excretion and allow [place] to maintain fluid restriction.  Sodium seems to be slowly improving we will continue with diuresis now up to 123 and water  restriction.  Chronic kidney disease stage III may be some acute changes type II possible hepatorenal syndrome continue to avoid nephrotoxic agents ACE inhibitor's ARB Cox 2 inhibitors nonsteroidal anti-drugs and IV contrast renal ultrasound reveals no evidence of hydronephrosis.  Urine sodium was 54.  Urinalysis was negative for protein negative for white cells negative for red blood cells.  Would continue diuresis at this point.  Hypokalemia agree with Lokelma 10 g twice daily  Anemia not an issue at this time  Hepatitis C with cirrhosis and ascites not a candidate  for liver transplantation or TIPS procedure rapid decompensation liver disease multiple times centesis since November 2019 potential very poor prognosis  Hepatocellular carcinoma status post radiation embolization   LOS: [place] @TODAY @11 :28 AM

## 2018-03-29 NOTE — Progress Notes (Addendum)
   Subjective: No overnight events.  Renee Rice reports feeling okay this morning.  She is having numerous bowel movements that she attributes to frequent lactulose dosing.  She continues to have some abdominal pain.  Her and her husband are working on deciding which outpatient palliative services they will proceed with. All questions and concerns addressed.    Objective:  Vital signs in last 24 hours: Vitals:   03/28/18 0513 03/28/18 1602 03/28/18 2112 03/29/18 0527  BP: 108/75 110/72 (!) 100/59 (!) 106/53  Pulse: (!) 47 95 88 76  Resp: '19 15 19 19  '$ Temp: (!) 97.3 F (36.3 C)  98.2 F (36.8 C) (!) 97.5 F (36.4 C)  TempSrc: Oral  Oral Oral  SpO2: 93% 99% 96% 95%  Weight: 84.2 kg   84.9 kg  Height:       Physical exam General: Comfortably laying in bed, no acute distress Abdomen: Soft, distended, tenderness to palpation right side Remedies: Bilateral lower extremity 2+ pitting edema  Assessment/Plan:  Principal Problem:   Hyponatremia with excess extracellular fluid volume Active Problems:   Ascites   Other cirrhosis of liver (HCC)   Hepatocellular carcinoma (HCC)   Goals of care, counseling/discussion   Palliative care by specialist   DNR (do not resuscitate) discussion  Renee Rice ins a 58yoF with chronic hep C complicated by cirrhosis and hepatocellular carcinomapresenting with worsening hypervolemic hyponatremia and left sided pleural effusion.  Hypervolemic Hyponatremia Cirrhosis2/2Chronic Hep C:Overall presentation consistent with decompensated cirrhosis.Hep C treatment has been delayed due to [facility] diagnosis. She is not a candidate for TIPS or transplant. Patient is amenable to outpatient palliative services and working with case management to find an outpatient group. She reports she is having too frequent BM so we will decrease the frequency of lactulose today. -Na 123, per nephro goal of 125 or greater before discharge   - Per nephro, recommend to continue  diuresing with IV furosemide 40 mg bid  - Output 600 ml in the past 24 hours  -Decreased lactulose to BID, goal is 3 BM/day - Continue rifaximin  - PT eval/treat  - Nephro on board, appreciate recommendations   GOC: Patient met with palliative care and discussed goals of care. She is considering her code status and agreeable to outpatient palliative services. She understands she is maximized on medical treatments. Her and her husband are trying to decide which outpatient palliative care they will want to work with.   AKI CKD III: Concerning for hepatorenal syndrome. - Cr 1.61, nephro recommends to continue diuresing - Daily BMP  Hyperkalemia: K 5.5 - Start lokelma BID   Dispo: Anticipated discharge in approximately 1 day(s).   Mike Craze, DO 03/29/2018, 7:18 AM Pager: [phone]

## 2018-03-29 NOTE — Progress Notes (Signed)
Palliative: [name], is sitting quietly in bed in a dark room.  She greets me making and somewhat keeping eye contact.  She is calm and cooperative, pleasant but appears to be uncomfortable.   We talked about discharge home with palliative services.  At this point Renee Rice does not qualify for hospice services, mostly because of her desire to continue the use of Xifaxan.  Provider choice offered and she chooses hospice and palliative care of [place].  We talked about the concepts of treat the treatable but allowing natural death.  Renee Rice states that she was able to speak with her husband Renee Rice last evening and she and he are in agreement for DNR.  [place] form completed and placed on chart.  Renee Rice states that she needs to have a phone conference with her mother, father and sister.  She states that they are "stressing me out".  We talked about how sometimes families wish things were different and push for treatments and medications that do not change what is happening.  Conference with case management related to in-home palliative services, DNR.  31 minutes Renee Axe, NP  Palliative Medicine Team  Team Phone # [phone]  Greater than 50% of this time was spent counseling and coordinating care related to the above assessment and plan.

## 2018-03-29 NOTE — TOC Initial Note (Signed)
Transition of Care [facility]) - Initial/Assessment Note    Patient Details  Name: Renee Rice MRN: [id number] Date of Birth: Nov 05, 1955  Transition [facility] Care [facility]) CM/SW Contact:    Bethena Roys, RN Phone Number: 03/29/2018, 4:24 PM  Clinical Narrative:  Pt presented for Hyponatremia. PTA from home with spouse. Patient has 02 at home 2L. Pt set up with Hospice and Palliative Care for Palliative Services. Referral sent to Venia Carbon with [facility]). Referral for PT with [facility] and DME referral sent to [employer] DME to be delivered on day of transition home. SOC to begin within 24-48 hours of transition home. No further needs at this time.                   Expected Discharge Plan: [place]- set up with [facility]) Barriers to Discharge: No Barriers Identified   Patient Goals and CMS Choice     Choice offered to / list presented to : Patient  Expected Discharge Plan and Services Expected Discharge Plan: [place]- set up with [facility]) Discharge Planning Services: CM Consult Post Acute Care Choice: [place] arrangements for the past 2 months: Single Family Home                 DME Arranged: Walker rolling DME Agency: [facility] HH Arranged: PT [place] Agency: [place] ([facility])  Prior Living Arrangements/Services Living arrangements for the past 2 months: Single Family Home Lives with:: Spouse   Do you feel safe going back to the place where you live?: Yes      Need for Family Participation in Patient Care: Yes (Comment) Care giver support system in place?: Yes (comment) Current home services: DME(has 46) Criminal Activity/Legal Involvement Pertinent to Current Situation/Hospitalization: No - Comment as needed  Activities of Daily Living Home Assistive Devices/Equipment: None ADL Screening (condition at time of admission) Patient's cognitive ability  adequate to safely complete daily activities?: Yes Is the patient deaf or have difficulty hearing?: No Does the patient have difficulty seeing, even when wearing glasses/contacts?: No Does the patient have difficulty concentrating, remembering, or making decisions?: No Patient able to express need for assistance with ADLs?: Yes Does the patient have difficulty dressing or bathing?: No(needs help from husband, due to acute illness) Independently performs ADLs?: Yes (appropriate for developmental age) Does the patient have difficulty walking or climbing stairs?: No(during this acute illness) Weakness of Legs: Both Weakness of Arms/Hands: Both  Permission Sought/Granted Permission sought to share information with : Case Manager                Emotional Assessment Appearance:: Appears stated age Attitude/Demeanor/Rapport: Engaged Affect (typically observed): Accepting Orientation: : Oriented to Self, Oriented to Place, Oriented to  Time, Oriented to Situation Alcohol / Substance Use: Never Used Psych Involvement: No (comment)  Admission diagnosis:  Hyponatremia [E87.1] Community acquired pneumonia, unspecified laterality [J18.9] Patient Active Problem List   Diagnosis Date Noted  . Goals of care, counseling/discussion   . Palliative care by specialist   . DNR (do not resuscitate) discussion   . Hyponatremia with excess extracellular fluid volume 03/25/2018  . Other cirrhosis of liver ([place])   . Hepatocellular carcinoma ([place])   . Hyponatremia   . Severe comorbid illness   . Tobacco use disorder   . Community acquired pneumonia 01/05/2018  . Ascites 12/09/2017  . Fibrosis of liver 05/17/2017  . Chronic viral hepatitis C ([place]) 04/27/2017  .  Cigarette smoker 02/10/2017   PCP:  Forrest Moron, MD Pharmacy:   [facility] [phone] - [address], [address] AT [facility] OF [address] [place] [address] Phone: [phone] Fax:  [facility] [place], [place] - [address] AT [address] [place] [address] Phone: [phone] Fax: [phone]     Social Determinants of Health (SDOH) Interventions    Readmission Risk Interventions 30 Day Unplanned Readmission Risk Score     ED to Hosp-Admission (Current) from 03/25/2018 in [facility]  30 Day Unplanned Readmission Risk Score (%)  21 Filed at 03/29/2018 1600     This score is the patient's risk of an unplanned readmission within 30 days of being discharged (0 -100%). The score is based on dignosis, age, lab data, medications, orders, and past utilization.   Low:  0-14.9   Medium: 15-21.9   High: 22-29.9   Extreme: 30 and above       No flowsheet data found.

## 2018-03-29 NOTE — Evaluation (Signed)
Physical Therapy Evaluation Patient Details Name: Renee Rice MRN: [id number] DOB: 10-16-1955 Today's Date: 03/29/2018   History of Present Illness  Renee Rice is a 71yoF with chronic hep C complicated by cirrhosis and hepatocellular carcinomapresenting with worsening hypervolemic hyponatremia and left sided pleural effusion.  Clinical Impression  Patient presents with decreased independence with mobility due to weakness, decreased activity tolerance and decreased balance with 2 recent falls at home.  Feel she can progress to home with HHPT and HHaide assistance (unless chooses Hospice help at home).  PT will follow acutely.    Follow Up Recommendations Home health PT(HH aide)    Equipment Recommendations  Rolling walker with 5" wheels    Recommendations for Other Services       Precautions / Restrictions Precautions Precautions: Fall Precaution Comments: 2 recent falls at home      Mobility  Bed Mobility Overal bed mobility: Needs Assistance Bed Mobility: Rolling;Sidelying to Sit;Sit to Supine Rolling: Supervision Sidelying to sit: Min assist   Sit to supine: Mod assist   General bed mobility comments: rolling with railing and effort, side to sit with assist for trunk, to supine assist for legs  Transfers Overall transfer level: Needs assistance Equipment used: Rolling walker (2 wheeled) Transfers: Stand Pivot Transfers;Sit to/from Stand Sit to Stand: Min guard Stand pivot transfers: Min guard       General transfer comment: cues for hand placement, assist to steady as rising from EOB, stand step to [facility] with RW and assist for safety as rushing   Ambulation/Gait Ambulation/Gait assistance: Min assist Gait Distance (Feet): 70 Feet Assistive device: Rolling walker (2 wheeled) Gait Pattern/deviations: Step-through pattern;Decreased stride length;Decreased stance time - right;Antalgic     General Gait Details: assist and cues for technique with RW for turns,  assist for safety/balance  Stairs            Wheelchair Mobility    Modified Rankin (Stroke Patients Only)       Balance Overall balance assessment: Needs assistance   Sitting balance-Leahy Scale: Good     Standing balance support: Bilateral upper extremity supported Standing balance-Leahy Scale: Poor Standing balance comment: UE support needed due to LE weakness                             Pertinent Vitals/Pain Pain Assessment: Faces Faces Pain Scale: Hurts little more Pain Location: abdomen and R leg with ambulation Pain Descriptors / Indicators: Aching;Sore Pain Intervention(s): Monitored during session;Repositioned;Limited activity within patient's tolerance    Home Living Family/patient expects to be discharged to:: Private residence Living Arrangements: Spouse/significant other Available Help at Discharge: Family;Available 24 hours/day Type of Home: House Home Access: Stairs to enter Entrance Stairs-Rails: None Entrance Stairs-Number of Steps: 3 Home Layout: Two level Home Equipment: Bedside commode;Toilet riser;Other (comment)(oxygen)      Prior Function Level of Independence: Needs assistance   Gait / Transfers Assistance Needed: spouse helping her to bathroom, drives truck up to just below steps   ADL's / Homemaking Assistance Needed: spouse assisting with bathing pt seated on edge of tub at times        Hand Dominance        Extremity/Trunk Assessment   Upper Extremity Assessment Upper Extremity Assessment: Generalized weakness    Lower Extremity Assessment Lower Extremity Assessment: LLE deficits/detail;RLE deficits/detail RLE Deficits / Details: AROM WFL, strength hip flexion 3/5, knee extension 4-/5 LLE Deficits / Details: AROM WFL, strength hip flexion 2/5,  knee extension 4-/5       Communication   Communication: No difficulties  Cognition Arousal/Alertness: Awake/alert Behavior During Therapy: WFL for tasks  assessed/performed Overall Cognitive Status: Within Functional Limits for tasks assessed                                        General Comments General comments (skin integrity, edema, etc.): SpO2 on RA initially dropping to 86% with activity, see note for oxygen qualification; demonstrated with pt/spouse how to assist up steps for home entry; showed pt/spouse options for tub bench on [place]    Exercises     Assessment/Plan    PT Assessment Patient needs continued PT services  PT Problem List Decreased strength;Decreased mobility;Decreased balance;Decreased activity tolerance       PT Treatment Interventions DME instruction;Therapeutic exercise;Gait training;Balance training;Stair training;Functional mobility training;Therapeutic activities;Patient/family education    PT Goals (Current goals can be found in the Care Plan section)  Acute Rehab PT Goals Patient Stated Goal: To reutrn home safely PT Goal Formulation: With patient/family Time For Goal Achievement: 04/05/18 Potential to Achieve Goals: Fair    Frequency Min 3X/week   Barriers to discharge        Co-evaluation               AM-PAC PT "6 Clicks" Mobility  Outcome Measure Help needed turning from your back to your side while in a flat bed without using bedrails?: A Little Help needed moving from lying on your back to sitting on the side of a flat bed without using bedrails?: A Little Help needed moving to and from a bed to a chair (including a wheelchair)?: A Little Help needed standing up from a chair using your arms (e.g., wheelchair or bedside chair)?: A Little Help needed to walk in hospital room?: A Little Help needed climbing 3-5 steps with a railing? : A Lot 6 Click Score: 17    End of Session Equipment Utilized During Treatment: Gait belt;Oxygen Activity Tolerance: Patient limited by fatigue Patient left: in bed;with call bell/phone within reach;with family/visitor present   PT  Visit Diagnosis: Other abnormalities of gait and mobility (R26.89);Muscle weakness (generalized) (M62.81)    Time: 5277-8242 PT Time Calculation (min) (ACUTE ONLY): 39 min   Charges:   PT Evaluation $PT Eval Moderate Complexity: 1 Mod PT Treatments $Gait Training: 8-22 mins $Therapeutic Activity: 8-22 mins        Magda Kiel, [place] Acute Rehabilitation Services [phone] 03/29/2018   Reginia Naas 03/29/2018, 12:20 PM

## 2018-03-30 DIAGNOSIS — Z66 Do not resuscitate: Secondary | ICD-10-CM

## 2018-03-30 DIAGNOSIS — E875 Hyperkalemia: Secondary | ICD-10-CM

## 2018-03-30 DIAGNOSIS — N189 Chronic kidney disease, unspecified: Secondary | ICD-10-CM

## 2018-03-30 LAB — RENAL FUNCTION PANEL
ALBUMIN: 1.8 g/dL — AB (ref 3.5–5.0)
Anion gap: 9 (ref 5–15)
BUN: 44 mg/dL — ABNORMAL HIGH (ref 8–23)
CO2: 18 mmol/L — ABNORMAL LOW (ref 22–32)
Calcium: 8.2 mg/dL — ABNORMAL LOW (ref 8.9–10.3)
Chloride: 90 mmol/L — ABNORMAL LOW (ref 98–111)
Creatinine, Ser: 1.6 mg/dL — ABNORMAL HIGH (ref 0.44–1.00)
GFR calc Af Amer: 40 mL/min — ABNORMAL LOW (ref >60)
GFR calc non Af Amer: 34 mL/min — ABNORMAL LOW (ref >60)
Glucose, Bld: 118 mg/dL — ABNORMAL HIGH (ref 70–99)
Phosphorus: 3.7 mg/dL (ref 2.5–4.6)
Potassium: 5.1 mmol/L (ref 3.5–5.1)
Sodium: 117 mmol/L — CL (ref 135–145)

## 2018-03-30 NOTE — Progress Notes (Signed)
[facility] ROUNDING NOTE   Subjective:   Brief history this is a 63 year old lady with cirrhosis hepatitis C complicated by hepatocellular carcinoma admitted with worsening hyponatremia.  She has been drinking copious amounts of fluids.  She is volume overloaded with tense ascites and lower extremity edema.  We were consulted due to a drop in her sodium to 118.  There also appears to be an increase in the serum creatinine which is increased from baseline of 0.6-1.2.  Positive fluid balance 03/29/2018 1 l in 500 cc out.  Weight increased to 87 kg.  Sodium 117 creatinine 1.6 BUN 44 glucose 118 potassium 5.1 chloride 90 CO2 18 calcium 8.2 phosphorus 3.7 albumin 1.8 WBC 16.3 hemoglobin 11.5 platelets 59   Medications rifaximin 550 mg twice daily, Lasix 40 mg every 12 hours, Xanax 0.25 mg as needed Tums, lactulose 20 g 3 times daily, Lovenox subcu every 24 hours,   Renal ultrasound 03/27/2018- renal ultrasound.  No hydronephrosis Chest x-ray revealed small to moderate left-sided pleural effusion left basilar opacities.  Objective:  Vital signs in last 24 hours:  Temp:  [97.7 F (36.5 C)-97.9 F (36.6 C)] 97.8 F (36.6 C) (03/14 0428) Pulse Rate:  [88-101] 88 (03/14 0428) Resp:  [19-21] 19 (03/14 0428) BP: (97-103)/(52-53) 97/52 (03/14 0428) SpO2:  [90 %-92 %] 90 % (03/14 0428) Weight:  [87 kg] 87 kg (03/14 0428)  Weight change: 2.132 kg Filed Weights   03/28/18 0513 03/29/18 0527 03/30/18 0428  Weight: 84.2 kg 84.9 kg 87 kg    Intake/Output: I/O last 3 completed shifts: In: 1532 [P.O.:1532] Out: 500 [Urine:500]   Intake/Output this shift:  Total I/O In: 120 [P.O.:120] Out: -  Alert nondistressed CVS- RRR no murmurs rubs gallops RS- CTA no wheezes rales ABD- BS present soft non-distended abdominal ascites EXT- 1-2+ pitting edema   Basic Metabolic Panel: Recent Labs  Lab 03/27/18 0409 03/27/18 0709 03/27/18 1059 03/28/18 0930 03/29/18 0405  03/30/18 0348  NA 121* 119* 119* 120* 123* 117*  K 5.1 5.0 4.8 4.8 5.5* 5.1  CL 92* 90* 92* 91* 92* 90*  CO2 23 22 19* 18* 22 18*  GLUCOSE 115* 105* 156* 163* 112* 118*  BUN 33* 32* 34* 37* 40* 44*  CREATININE 1.40* 1.31* 1.29* 1.43* 1.61* 1.60*  CALCIUM 8.6* 8.5* 8.2* 8.1* 8.6* 8.2*  PHOS 3.9  --   --  3.6 4.2 3.7    Liver Function Tests: Recent Labs  Lab 03/25/18 1502 03/26/18 0412 03/27/18 0409 03/28/18 0930 03/29/18 0405 03/30/18 0348  AST 125* 123*  --   --   --   --   ALT 87* 84*  --   --   --   --   ALKPHOS 142* 144*  --   --   --   --   BILITOT 8.7* 7.6*  --   --   --   --   PROT 6.0* 5.5*  --   --   --   --   ALBUMIN 2.0* 1.9* 1.9* 1.6* 1.8* 1.8*   No results for input(s): LIPASE, AMYLASE in the last 168 hours. No results for input(s): AMMONIA in the last 168 hours.  CBC: Recent Labs  Lab 03/25/18 1502 03/26/18 0412 03/29/18 0405  WBC 11.4* 11.2* 16.3*  HGB 12.4 11.5* 11.5*  HCT 34.3* 30.7* 30.9*  MCV 93.2 90.8 90.6  PLT 76* 64* 59*    Cardiac Enzymes: No results for input(s): CKTOTAL, CKMB, CKMBINDEX, TROPONINI in the last 168  hours.  BNP: Invalid input(s): POCBNP  CBG: No results for input(s): GLUCAP in the last 168 hours.  Microbiology: Results for orders placed or performed during the hospital encounter of 03/25/18  Blood culture (routine x 2)     Status: None (Preliminary result)   Collection Time: 03/26/18  7:30 AM  Result Value Ref Range Status   Specimen Description BLOOD LEFT HAND  Final   Special Requests   Final    BOTTLES DRAWN AEROBIC ONLY Blood Culture adequate volume   Culture   Final    NO GROWTH 3 DAYS Performed at [facility], [place] [address], [place], [place] [address]    Report Status PENDING  Incomplete  Blood culture (routine x 2)     Status: None (Preliminary result)   Collection Time: 03/26/18  7:36 AM  Result Value Ref Range Status   Specimen Description BLOOD RIGHT ANTECUBITAL  Final   Special Requests    Final    BOTTLES DRAWN AEROBIC ONLY Blood Culture adequate volume   Culture   Final    NO GROWTH 3 DAYS Performed at [facility], [place] [address] [place], [place] [address]    Report Status PENDING  Incomplete    Coagulation Studies: No results for input(s): LABPROT, INR in the last 72 hours.  Urinalysis: Recent Labs    03/29/18 0200  COLORURINE AMBER*  LABSPEC 1.012  PHURINE 5.0  GLUCOSEU NEGATIVE  HGBUR NEGATIVE  BILIRUBINUR NEGATIVE  KETONESUR NEGATIVE  PROTEINUR NEGATIVE  NITRITE NEGATIVE  LEUKOCYTESUR NEGATIVE      Imaging: No results found.   Medications:    . enoxaparin (LOVENOX) injection  30 mg Subcutaneous Q24H  . furosemide  40 mg Intravenous Q12H  . lactose free nutrition  237 mL Oral BID BM  . lactulose  20 g Oral BID  . rifaximin  550 mg Oral BID  . sodium zirconium cyclosilicate  10 g Oral BID   acetaminophen **OR** acetaminophen, ALPRAZolam, calcium carbonate, ondansetron **OR** ondansetron (ZOFRAN) IV  Assessment/ Plan:   Hyponatremia hypovolemic hyponatremia appears to be a clinical diagnosis in the setting of cirrhosis.  TSH slightly elevated at 7.212.  A.m. cortisol 15.7.  Urine sodium 54 urine osmolality 219.  The problem with these indices appears to be the use of Lasix.  They do not reflect the true etiology of the hyponatremia although clinically I think that we can safely say the patient is volume overloaded and in need of diuresis.  As the patient may be intravascularly dry but total body water overloaded, there may be some reflection in terms of worsening renal insufficiency as we continue to diurese.. This would be expected.  At this time would like to continue IV diuresis with Lasix this should help promote free water excretion and allow [place] to maintain fluid restriction.  Appears that she is in positive fluid balance with weight gain.  This is secondary to fluid retention and her cirrhosis.  She will need to have most transient  restrictions on fluid intake.  I discussed this with her today she is agreeable.  I would decrease her fluid intake to 800 cc daily.  Would continue Lasix diuresis.  Chronic kidney disease stage III may be some acute changes type II possible hepatorenal syndrome continue to avoid nephrotoxic agents ACE inhibitor's ARB Cox 2 inhibitors nonsteroidal anti-drugs and IV contrast renal ultrasound reveals no evidence of hydronephrosis.  Urine sodium was 54.  Urinalysis was negative for protein negative for white  cells negative for red blood cells.  Would continue diuresis at this point.  Agree with referral to hospice and palliative care.  Hypokalemia agree with Lokelma 10 g twice daily  Anemia not an issue at this time  Hepatitis C with cirrhosis and ascites not a candidate for liver transplantation or TIPS procedure rapid decompensation liver disease multiple times centesis since November 2019 potential very poor prognosis  Hepatocellular carcinoma status post radiation embolization   LOS: [place] @TODAY @9 :48 AM

## 2018-03-30 NOTE — Plan of Care (Signed)
  Problem: Education: Goal: Knowledge of General Education information will improve Description: Including pain rating scale, medication(s)/side effects and non-pharmacologic comfort measures Outcome: Progressing   Problem: Health Behavior/Discharge Planning: Goal: Ability to manage health-related needs will improve Outcome: Progressing   Problem: Clinical Measurements: Goal: Ability to maintain clinical measurements within normal limits will improve Outcome: Progressing Goal: Will remain free from infection Outcome: Progressing Goal: Diagnostic test results will improve Outcome: Progressing Goal: Respiratory complications will improve Outcome: Progressing Goal: Cardiovascular complication will be avoided Outcome: Progressing   Problem: Safety: Goal: Ability to remain free from injury will improve Outcome: Progressing   

## 2018-03-30 NOTE — Progress Notes (Signed)
   Subjective: Sleeping in bed this morning, had some transient abdominal pain that is now resolved. No N/V or fevers. Otherwise no complaints.   Objective:  Vital signs in last 24 hours: Vitals:   03/29/18 0527 03/29/18 1348 03/29/18 1956 03/30/18 0428  BP: (!) 106/53 (!) 103/53 (!) 98/52 (!) 97/52  Pulse: 76 (!) 101 91 88  Resp: 19 (!) 21 20 19   Temp: (!) 97.5 F (36.4 C) 97.7 F (36.5 C) 97.9 F (36.6 C) 97.8 F (36.6 C)  TempSrc: Oral Oral Oral Oral  SpO2: 95% 92% 91% 90%  Weight: 84.9 kg   87 kg  Height:       Physical Exam Constitutional: NAD, appears comfortable Cardiovascular: RRR, no murmurs, rubs, or gallops.  Pulmonary/Chest: CTAB, no wheezes, rales, or rhonchi.  Abdominal: Distended, moderately tense ascites but compressible, +BS Extremities: Warm and well perfused. 1+ LE edema  Skin: multiple spider telangectasia's     Assessment/Plan:  Principal Problem:   Hyponatremia with excess extracellular fluid volume Active Problems:   Ascites   Other cirrhosis of liver (HCC)   Hepatocellular carcinoma (HCC)   Goals of care, counseling/discussion   Palliative care by specialist   DNR (do not resuscitate) discussion  Hypervolemic Hyponatremia: Due to end stage cirrhosis 2/2 chronic hep C, complicated by [facility]. She is not a candidate for TIPS or transplant. Palliative has been consulted, appreciate assistance. Planning for outpatient hospice, patient is now DNR. Unfortunately sodium has dropped today. Will follow up plans with nephrology -- Nephrology, palliative care consults; appreciate recommendations  -- Goal Na 125 or greater prior to discharge  -- Increase fluid restriction 800 cc  -- IV lasix 40 q12h -- Strict I&Os -- PT/ OT  Acute on Chronic Kidney Disease: AKI concerning for hepatorenal syndrome.  -- Appreciate nephrology recommendations  -- Continue diuresis  -- Daily BMP   Hyperkalemia: improved with lokelma  -- Continue lokelma BID   FEN: fluid  restrict 800 cc, replete lytes prn, HH diet VTE ppx: Lovenox  Code Status: DNR  Dispo: Anticipated discharge pending improvement in sodium.   Velna Ochs, MD 03/30/2018, 7:18 AM Pager: [phone]

## 2018-03-30 NOTE — Progress Notes (Signed)
Received call from [facility] that they cannot accept the patient for HHPT;  [facility] called - they cannot accept patient due to insurance [facility] called - they do not accept her insurance. [facility] called - they cannot accept her insurance [facility] called; talked to [place], they can accept the patient; CM talked to patient at the bedside, she is in agreement with the plan; no other needs voiced. Mindi Slicker RN,MHA,BSN

## 2018-03-30 NOTE — Progress Notes (Signed)
CRITICAL VALUE ALERT  Critical Value:  Na 117  Date & Time Notied:  3/14 at 0424  Provider Notified: IM on call notified  Orders Received/Actions taken: pending.Marland KitchenMarland Kitchen

## 2018-03-31 LAB — CULTURE, BLOOD (ROUTINE X 2)
Culture: NO GROWTH
Culture: NO GROWTH
Special Requests: ADEQUATE
Special Requests: ADEQUATE

## 2018-03-31 LAB — RENAL FUNCTION PANEL
ANION GAP: 7 (ref 5–15)
Albumin: 1.8 g/dL — ABNORMAL LOW (ref 3.5–5.0)
BUN: 52 mg/dL — ABNORMAL HIGH (ref 8–23)
CO2: 21 mmol/L — ABNORMAL LOW (ref 22–32)
Calcium: 8 mg/dL — ABNORMAL LOW (ref 8.9–10.3)
Chloride: 90 mmol/L — ABNORMAL LOW (ref 98–111)
Creatinine, Ser: 1.97 mg/dL — ABNORMAL HIGH (ref 0.44–1.00)
GFR calc Af Amer: 31 mL/min — ABNORMAL LOW (ref >60)
GFR calc non Af Amer: 27 mL/min — ABNORMAL LOW (ref >60)
GLUCOSE: 110 mg/dL — AB (ref 70–99)
Phosphorus: 4.7 mg/dL — ABNORMAL HIGH (ref 2.5–4.6)
Potassium: 5 mmol/L (ref 3.5–5.1)
SODIUM: 118 mmol/L — AB (ref 135–145)

## 2018-03-31 MED ORDER — OXYCODONE HCL 5 MG PO TABS
5.0000 mg | ORAL_TABLET | Freq: Four times a day (QID) | ORAL | Status: DC | PRN
  Administered 2018-03-31: 5 mg via ORAL
  Filled 2018-03-31: qty 1

## 2018-03-31 MED ORDER — DICYCLOMINE HCL 20 MG PO TABS
20.0000 mg | ORAL_TABLET | Freq: Once | ORAL | Status: AC
  Administered 2018-03-31: 20 mg via ORAL
  Filled 2018-03-31: qty 1

## 2018-03-31 MED ORDER — LACTULOSE 10 GM/15ML PO SOLN
20.0000 g | Freq: Three times a day (TID) | ORAL | Status: DC
  Administered 2018-03-31: 20 g via ORAL
  Filled 2018-03-31: qty 30

## 2018-03-31 MED ORDER — LACTULOSE 10 GM/15ML PO SOLN
30.0000 g | Freq: Three times a day (TID) | ORAL | Status: DC
  Administered 2018-03-31 – 2018-04-01 (×4): 30 g via ORAL
  Filled 2018-03-31 (×5): qty 45

## 2018-03-31 NOTE — Progress Notes (Signed)
   Subjective: Patient sleeping comfortable this morning. Somewhat somnolent on exam, but oriented. No complaints. Disappointed that her sodium did not improve. Reports 1-2 BM a day.   Objective:  Vital signs in last 24 hours: Vitals:   03/30/18 0428 03/30/18 2042 03/31/18 0448 03/31/18 0756  BP: (!) 97/52 (!) 102/49 94/61   Pulse: 88 91 88 83  Resp: 19 16 (!) 21 14  Temp: 97.8 F (36.6 C) 98.1 F (36.7 C) 98.2 F (36.8 C)   TempSrc: Oral Oral Oral   SpO2: 90%  94% 95%  Weight: 87 kg  87.9 kg   Height:       Physical Exam Constitutional: NAD, appears comfortable Cardiovascular: RRR, no murmurs, rubs, or gallops.  Pulmonary/Chest: CTAB, no wheezes, rales, or rhonchi.  Abdominal: Distended, moderately tense ascites but compressible, +BS Extremities: Warm and well perfused. 1+ LE edema  Skin: multiple spider telangectasia's    Assessment/Plan:  Principal Problem:   Hyponatremia with excess extracellular fluid volume Active Problems:   Ascites   Other cirrhosis of liver (HCC)   Hepatocellular carcinoma (HCC)   Goals of care, counseling/discussion   Palliative care by specialist   DNR (do not resuscitate) discussion   End Stage Cirrhosis: Due to chronic hep C infection and HCC. She is not a candidate for TIPS or transplant. Palliative care has been consulted, appreciate assistance. Recommended outpatient hospice, patient is now DNR. Spoke with case management; patient was denied full hospice due to being on medication rifaximin, but approved for home palliative care. We will discontinue rifaximin and ask she be reassessed for home hospice. At this point patient would benefit much more from home hospice than whatever minimal benefit she may be receiving from rifaximin.  -- Stop rifaximin -- Increase lactulose 30 g TID; goal BM 3 daily  -- F/u recommendations for home hospice; appreciate case management assistance   Refractory Hyponatremia Acute on Chronic Kidney Disease  Overall clinical picture and AKI concerning for hepatorenal syndrome. She has not improved with fluid restriction or IV diuresis. Renal function continues to decline. Appreciate nephrology recommendations; spoke with Dr. Justin Mend. At this point we have nothing else to offer the patient that would be appropriate given overall prognosis and plans for outpatient hospice. Will continue with IV lasix while admitted, change to po on discharge.  -- Continue fluid restriction 800 cc  -- IV lasix 40 q12h; change to lasix 20 mg BID per nephro on discharge -- Strict I&Os -- PT/ OT  Hyperkalemia: improved with lokelma  -- Continue lokelma BID   FEN: fluid restrict 800 cc, replete lytes prn, HH diet VTE ppx: Lovenox  Code Status: DNR  Dispo: Ready for discharge pending plans for home hospice. Unfortunately patient has not improved clinically and we have nothing else to offer treatment wise for her end stage cirrhosis, refractory hyponatremia, and hepatorenal syndrome. Overall life expectancy weeks to months.  Velna Ochs, MD 03/31/2018, 12:27 PM Pager: [phone]

## 2018-03-31 NOTE — Progress Notes (Signed)
Pt assessed with O2 off for  25min.  O2 sats dropped to 84% and did not get above 88%.  O2 re-applied aat 2lpm [place].  O2 sats 93-94% at this time.

## 2018-03-31 NOTE — Care Management (Addendum)
Call received from [facility] with [facility] that she would review chart and proceed with referral.    0149:  Pt is approved for hospice with [facility] and will proceed with d/c tomorrow per patient's and husband's request.  Pt only needs DME RW, which [facility] will have delivered to room tomorrow.

## 2018-03-31 NOTE — TOC Progression Note (Signed)
Transition of Care [facility]) - Progression Note    Patient Details  Name: Renee Rice MRN: [id number] Date of Birth: 1955/01/26  Transition of Care [facility]) CM/SW Contact  Claudie Leach, RN Phone Number: 03/31/2018, 5:28 PM  Clinical Narrative:    1325:Call received from [place] Pier with [facility] that she would review chart and proceed with referral.    4967:  Pt is approved for hospice with [facility] and will proceed with d/c tomorrow per patient's and husband's request.  Pt only needs DME RW, which [facility] will have delivered to room tomorrow.     Expected Discharge Plan: [place]- set up with [facility]) Barriers to Discharge: No Barriers Identified  Expected Discharge Plan and Services Expected Discharge Plan: [place] up with [facility]) Discharge Planning Services: CM Consult Post Acute Care Choice: Hospice Living arrangements for the past 2 months: Single Family Home                 DME Arranged: [place] rolling DME Agency: [facility] HH Arranged: PT HH Agency: [facility] Determinants of Health (SDOH) Interventions    Readmission Risk Interventions 30 Day Unplanned Readmission Risk Score     ED to Hosp-Admission (Current) from 03/25/2018 in [place] [facility]  30 Day Unplanned Readmission Risk Score (%)  21 Filed at 03/31/2018 1600     This score is the patient's risk of an unplanned readmission within 30 days of being discharged (0 -100%). The score is based on dignosis, age, lab data, medications, orders, and past utilization.   Low:  0-14.9   Medium: 15-21.9   High: 22-29.9   Extreme: 30 and above       Readmission Risk Prevention Plan 03/31/2018  Transportation Screening Complete  PCP or Specialist Appt within 5-7 Days Complete  Home Care Screening Complete  Medication Review (RN CM) Not Complete  Some recent data might be hidden

## 2018-03-31 NOTE — Progress Notes (Signed)
Manufacturing engineer [facility])  Received request from [place] for family interest in hospice services at home after dischare. Chart and patient information reviewed by [facility] physician and eligibility has been confirmed. Spoke with spouse by phone to initiate education related to hospice philosophy, services and team approach to care. Spouse verbalized understanding of information given.  Per discussion, plan is for discharge to home 04/01/18.  Please send signed and completed DNR form home with patient/family.   Patient will need prescriptions for discharge comfort medications. DME needs have been discussed, patient currently has the following equipment in the home: oxygen setup and BSC from [employer]. Spouse requesting RW for use at home. RW to be ordered 04/01/18. Home address has been verified and is correct in the chart. Spouse is contact to arrange DME.   [facility] aware of the above.  Completed discharge summary will need to be faxed to [facility] at [phone] when final.  Please notify [facility] when patient is ready to leave the unit at discharge. (Call [phone] between 8:30 and 5pm. Call [phone] after 5pm.)  Above information shared with Claudie Leach RNCM.  Please call with hospice related questions. Thank you for this referral. Erling Conte, Marlinda Mike Filer Advance are listed on [facility] under Hospice and [address]

## 2018-03-31 NOTE — Discharge Summary (Signed)
Name: Renee Rice MRN: [id number] DOB: 03/27/1955 63 y.o. PCP: Forrest Moron, MD  Date of Admission: 03/25/2018  2:51 PM Date of Discharge: 04/01/2018  6:57 PM  Attending Physician: Oda Kilts, MD  Discharge Diagnosis: 1. End stage cirrhosis 2/2 HCC and Hep C 2. Refractory Hyponatremia 3. AoCKD 4. Hyperkalemia  Discharge Medications: Allergies as of 04/01/2018   No Known Allergies     Medication List    STOP taking these medications   cefdinir 300 MG capsule Commonly known as:  OMNICEF   spironolactone 100 MG tablet Commonly known as:  ALDACTONE   spironolactone 50 MG tablet Commonly known as:  ALDACTONE   Xifaxan 550 MG Tabs tablet Generic drug:  rifaximin     TAKE these medications   ALPRAZolam 0.25 MG tablet Commonly known as:  XANAX Take 1 tablet (0.25 mg total) by mouth at bedtime as needed for anxiety. What changed:  reasons to take this   calcium carbonate 500 MG chewable tablet Commonly known as:  TUMS - dosed in mg elemental calcium Chew 1 tablet by mouth as needed for indigestion or heartburn.   Excedrin Extra Strength 250-250-65 MG tablet Generic drug:  aspirin-acetaminophen-caffeine Take 1-2 tablets by mouth every 6 (six) hours as needed for headache (or pain).   furosemide 40 MG tablet Commonly known as:  LASIX Take 0.5 tablets (20 mg total) by mouth 2 (two) times daily. What changed:    how much to take  when to take this   lactose free nutrition Liqd Take 237 mLs by mouth 2 (two) times daily between meals.   lactulose 10 g packet Commonly known as:  CEPHULAC Take 1 packet (10 g total) by mouth 2 (two) times daily. What changed:  Another medication with the same name was changed. Make sure you understand how and when to take each.   lactulose 10 GM/15ML solution Commonly known as:  CHRONULAC Take 45 mLs (30 g total) by mouth 3 (three) times daily. What changed:  how much to take   naproxen sodium 220 MG  tablet Commonly known as:  ALEVE Take 220-440 mg by mouth daily as needed (for pain).   ONE-A-DAY WOMENS PO Take 1 tablet by mouth daily with breakfast.   sertraline 50 MG tablet Commonly known as:  ZOLOFT Take 1 tablet (50 mg total) by mouth daily.   sodium zirconium cyclosilicate 10 g Pack packet Commonly known as:  LOKELMA Take 10 g by mouth 2 (two) times daily.   traMADol 50 MG tablet Commonly known as:  ULTRAM Take 50 mg by mouth daily as needed for moderate pain.       Disposition and follow-up:   [name] was discharged from [facility] in [place] condition.  At the hospital follow up visit please address:  1.  End stage cirrhosis 2/2 HCC and hep C: She was discharged to home hospice, please assess measures to ensure comfort. She is not a candidate for TIPs or transplant.   Hypervolemic hypotonic hyponatremia: refractory to fluid restriction.   2.  Labs / imaging needed at time of follow-up: None  3.  Pending labs/ test needing follow-up: None  Follow-up Appointments: Follow-up Information    [place] At Follow up.   Specialty:  Hospice and Palliative Medicine Why:  Palliative Services Contact information: [place] [phone] [place] .  Health, [facility] .   Contact information: [address] [place] [address] [phone]        Care, [facility] Follow up.   Specialty:  [place] Why:  They will do your home health care at your home Contact information: [place] [address] [phone]           Hospital Course by problem list: 1. End stage cirrhosis 2/2 HCC and Hep C: This is a 63 year old female with a history of cirrhosis due to hepatitis C complicated by hepatocellular carcinoma who presented with hyponatremia and worsening pleural effusions. After discussion with her GI doctor, Dr. Loletha Carrow, she is not a  candidate for liver transplant or TIPS procedures.  She was diagnosed with hepatocellular carcinoma stage II in 2019 and partially treated with radio embolization, since that procedure she has been having worsening ascites on exam.  She was found to have decompensated cirrhosis with an elevated meld score of 31.  She was found to have ascites exam, recent paracentesis on 3/6.  She is continued on her home lactulose and rifaximin initially.  Given her poor prognosis in the long-term palliative care was consulted.  Patient was agreeable to hospice care and home hospice was arranged, rifaximin was discontinued, this was continued on discharge.  2. Refractory Hyponatremia:  3. AoCKD:  4. Hyperkalemia:  Due to end-stage cirrhosis.  Patient was in fluid restriction and diuresis with Lasix.  Nephrology was consulted and recommended fluid restriction and diuresis.  Sodium showed some improvement however it then started to decrease again.  She also started developing worsening renal function likely due to hepatorenal syndrome and hyponatremia was refractory to treatment options.   Became very concerning for hepatorenal syndrome, no improvement with fluid restriction diuresis.  Nephrology had been consulted, Cr started to worsen and it was felt that this was related to hepatorenal syndrome. She was changed ot lasix 20 mg BID per nephology on discharge.   Discharge Vitals:   BP (!) 93/57 (BP Location: Right Arm)    Pulse 82    Temp (!) 97.4 F (36.3 C) (Oral)    Resp 17    Ht 5\' 11"  (1.803 m)    Wt 88.2 kg    SpO2 90%    BMI 27.13 kg/m   Pertinent Labs, Studies, and Procedures:  CBC Latest Ref Rng & Units 03/29/2018 03/26/2018 03/25/2018  WBC 4.0 - 10.5 K/uL 16.3(H) 11.2(H) 11.4(H)  Hemoglobin 12.0 - 15.0 g/dL 11.5(L) 11.5(L) 12.4  Hematocrit 36.0 - 46.0 % 30.9(L) 30.7(L) 34.3(L)  Platelets 150 - 400 K/uL 59(L) 64(L) 76(L)   BMP Latest Ref Rng & Units 04/01/2018 03/31/2018 03/30/2018  Glucose 70 - 99 mg/dL 118(H)  110(H) 118(H)  BUN 8 - 23 mg/dL 59(H) 52(H) 44(H)  Creatinine 0.44 - 1.00 mg/dL 2.26(H) 1.97(H) 1.60(H)  BUN/Creat Ratio 6 - 22 (calc) - - -  Sodium 135 - 145 mmol/L 117(LL) 118(LL) 117(LL)  Potassium 3.5 - 5.1 mmol/L 4.4 5.0 5.1  Chloride 98 - 111 mmol/L 88(L) 90(L) 90(L)  CO2 22 - 32 mmol/L 19(L) 21(L) 18(L)  Calcium 8.9 - 10.3 mg/dL 8.2(L) 8.0(L) 8.2(L)     Discharge Instructions: Discharge Instructions    Call MD for:  difficulty breathing, headache or visual disturbances   Complete by:  As directed    Call MD for:  extreme fatigue   Complete by:  As directed    Call MD for:  persistant dizziness or light-headedness  Complete by:  As directed    Call MD for:  persistant nausea and vomiting   Complete by:  As directed    Call MD for:  severe uncontrolled pain   Complete by:  As directed    Call MD for:  temperature >100.4   Complete by:  As directed    Discharge instructions   Complete by:  As directed    Ms. Renee Rice,  It was a pleasure to meet you. I am so sorry there wasn't more we could do for you during this hospitalization. I wish you all the best as your return home. I hope you are able to enjoy some time with your family.   Please stop taking rifaximin. I have increased your lactulose to 30 g three times daily instead. You may titrate this as needed to a goal of 3 bowel movements a day. If you are having too many bowel movements you may decrease to twice a day.   I have also changed your lasix. Please take 20 mg twice a day and stop taking your spironolactone.   Make an appointment to follow up with your primary care physician at your earliest convenience.   Take care,   Dr. Philipp Ovens   Increase activity slowly   Complete by:  As directed       Signed: Asencion Noble, MD 04/01/2018, 6:37 PM   Pager: [phone]

## 2018-03-31 NOTE — Progress Notes (Addendum)
[facility] ROUNDING NOTE   Subjective:   Brief history this is a 63 year old lady with cirrhosis hepatitis C complicated by hepatocellular carcinoma admitted with worsening hyponatremia.  She has been drinking copious amounts of fluids.  She is volume overloaded with tense ascites and lower extremity edema.  We were consulted due to a drop in her sodium to 118.  There also appears to be an increase in the serum creatinine which is increased from baseline of 0.6-1.2.  600 cc in 03/30/2018 following a more stringent fluid restriction  Blood pressure 94/61 pulse 88 temperature 98.2  Sodium 118 potassium 5.0 chloride 90 CO2 21 BUN 52 creatinine 1.97 glucose 110 calcium 8.0 phosphorus 4.7 albumin 1.8 WBCs 16.3 hemoglobin 11.5 platelets 59  Urine sodium less than 10  Medications rifaximin 550 mg twice daily, Lasix 40 mg every 12 hours, Xanax 0.25 mg as needed Tums, lactulose 20 g 3 times daily, Lovenox subcu every 24 hours,   Renal ultrasound 03/27/2018- renal ultrasound.  No hydronephrosis Chest x-ray revealed small to moderate left-sided pleural effusion left basilar opacities.  Objective:  Vital signs in last 24 hours:  Temp:  [98.1 F (36.7 C)-98.2 F (36.8 C)] 98.2 F (36.8 C) (03/15 0448) Pulse Rate:  [83-91] 83 (03/15 0756) Resp:  [14-21] 14 (03/15 0756) BP: (94-102)/(49-61) 94/61 (03/15 0448) SpO2:  [94 %-95 %] 95 % (03/15 0756) Weight:  [87.9 kg] 87.9 kg (03/15 0448)  Weight change: 0.907 kg Filed Weights   03/29/18 0527 03/30/18 0428 03/31/18 0448  Weight: 84.9 kg 87 kg 87.9 kg    Intake/Output: I/O last 3 completed shifts: In: 992 [P.O.:992] Out: 200 [Urine:200]   Intake/Output this shift:  No intake/output data recorded. Alert nondistressed CVS- RRR no murmurs rubs gallops RS- CTA no wheezes rales ABD- BS present soft non-distended abdominal ascites EXT- 1-2+ pitting edema   Basic Metabolic Panel: Recent Labs  Lab 03/27/18 0409   03/27/18 1059 03/28/18 0930 03/29/18 0405 03/30/18 0348 03/31/18 0448  NA 121*   < > 119* 120* 123* 117* 118*  K 5.1   < > 4.8 4.8 5.5* 5.1 5.0  CL 92*   < > 92* 91* 92* 90* 90*  CO2 23   < > 19* 18* 22 18* 21*  GLUCOSE 115*   < > 156* 163* 112* 118* 110*  BUN 33*   < > 34* 37* 40* 44* 52*  CREATININE 1.40*   < > 1.29* 1.43* 1.61* 1.60* 1.97*  CALCIUM 8.6*   < > 8.2* 8.1* 8.6* 8.2* 8.0*  PHOS 3.9  --   --  3.6 4.2 3.7 4.7*   < > = values in this interval not displayed.    Liver Function Tests: Recent Labs  Lab 03/25/18 1502 03/26/18 0412 03/27/18 0409 03/28/18 0930 03/29/18 0405 03/30/18 0348 03/31/18 0448  AST 125* 123*  --   --   --   --   --   ALT 87* 84*  --   --   --   --   --   ALKPHOS 142* 144*  --   --   --   --   --   BILITOT 8.7* 7.6*  --   --   --   --   --   PROT 6.0* 5.5*  --   --   --   --   --   ALBUMIN 2.0* 1.9* 1.9* 1.6* 1.8* 1.8* 1.8*   No results for input(s): LIPASE, AMYLASE in the last  168 hours. No results for input(s): AMMONIA in the last 168 hours.  CBC: Recent Labs  Lab 03/25/18 1502 03/26/18 0412 03/29/18 0405  WBC 11.4* 11.2* 16.3*  HGB 12.4 11.5* 11.5*  HCT 34.3* 30.7* 30.9*  MCV 93.2 90.8 90.6  PLT 76* 64* 59*    Cardiac Enzymes: No results for input(s): CKTOTAL, CKMB, CKMBINDEX, TROPONINI in the last 168 hours.  BNP: Invalid input(s): POCBNP  CBG: No results for input(s): GLUCAP in the last 168 hours.  Microbiology: Results for orders placed or performed during the hospital encounter of 03/25/18  Blood culture (routine x 2)     Status: None (Preliminary result)   Collection Time: 03/26/18  7:30 AM  Result Value Ref Range Status   Specimen Description BLOOD LEFT HAND  Final   Special Requests   Final    BOTTLES DRAWN AEROBIC ONLY Blood Culture adequate volume   Culture   Final    NO GROWTH 4 DAYS Performed at [facility], [place] [address], [place], [place] [address]    Report Status PENDING  Incomplete  Blood  culture (routine x 2)     Status: None (Preliminary result)   Collection Time: 03/26/18  7:36 AM  Result Value Ref Range Status   Specimen Description BLOOD RIGHT ANTECUBITAL  Final   Special Requests   Final    BOTTLES DRAWN AEROBIC ONLY Blood Culture adequate volume   Culture   Final    NO GROWTH 4 DAYS Performed at [facility], [place] [address], [place], [place] [address]    Report Status PENDING  Incomplete    Coagulation Studies: No results for input(s): LABPROT, INR in the last 72 hours.  Urinalysis: Recent Labs    03/29/18 0200  COLORURINE AMBER*  LABSPEC 1.012  PHURINE 5.0  GLUCOSEU NEGATIVE  HGBUR NEGATIVE  BILIRUBINUR NEGATIVE  KETONESUR NEGATIVE  PROTEINUR NEGATIVE  NITRITE NEGATIVE  LEUKOCYTESUR NEGATIVE      Imaging: No results found.   Medications:    . enoxaparin (LOVENOX) injection  30 mg Subcutaneous Q24H  . furosemide  40 mg Intravenous Q12H  . lactose free nutrition  237 mL Oral BID BM  . lactulose  20 g Oral TID  . rifaximin  550 mg Oral BID  . sodium zirconium cyclosilicate  10 g Oral BID   acetaminophen **OR** acetaminophen, ALPRAZolam, calcium carbonate, ondansetron **OR** ondansetron (ZOFRAN) IV  Assessment/ Plan:   Hyponatremia hypovolemic hyponatremia appears to be a clinical diagnosis in the setting of cirrhosis.  TSH slightly elevated at 7.212.  A.m. cortisol 15.7.  Urine sodium 54 urine osmolality 219.  The problem with these indices appears to be the use of Lasix.  They do not reflect the true etiology of the hyponatremia although clinically I think that we can safely say the patient is volume overloaded and in need of diuresis.  As the patient may be intravascularly dry but total body water overloaded, there may be some reflection in terms of worsening renal insufficiency as we continue to diurese.. This would be expected.  At this time would like to continue IV diuresis with Lasix this should help promote free water excretion and  allow [place] to maintain fluid restriction.  Unfortunately sodium is not ready bulging very much.  Her urine sodium is less than 10..  Would continue Lasix diuresis.  Renal function appears to be worsening secondary to probable hepatorenal syndrome. [facility] called; talked to [place], they can accept the patient.  Difficult  situation as patient is refractory to therapy at this point  Chronic kidney disease stage III may be some acute changes type II possible hepatorenal syndrome continue to avoid nephrotoxic agents ACE inhibitor's ARB Cox 2 inhibitors nonsteroidal anti-drugs and IV contrast renal ultrasound reveals no evidence of hydronephrosis.  Urine sodium was less than 10 urinalysis was negative for protein negative for white cells negative for red blood cells.  Would continue diuresis at this point.  Agree with referral to hospice and palliative care.  Hypokalemia agree with Lokelma 10 g twice daily  Anemia not an issue at this time  Hepatitis C with cirrhosis and ascites not a candidate for liver transplantation or TIPS procedure rapid decompensation liver disease multiple times centesis since November 2019 potential very poor prognosis  Hepatocellular carcinoma status post radiation embolization  Will sign off patient at this point thank you for the referral of this very nice lady.  Please call back if needed   LOS: [place] @TODAY @10 :02 AM

## 2018-03-31 NOTE — Progress Notes (Signed)
Received call from Attending MD, she wants patient to have full Hospice care in the home and plans to discontinue Xifaxan; CM talked to patient, she is agreeable to go home with Hospice Care; Referral made as requested; Aneta Mins [phone]

## 2018-04-01 ENCOUNTER — Telehealth: Payer: Self-pay | Admitting: Family Medicine

## 2018-04-01 ENCOUNTER — Telehealth: Payer: Self-pay | Admitting: Gastroenterology

## 2018-04-01 DIAGNOSIS — I8510 Secondary esophageal varices without bleeding: Secondary | ICD-10-CM

## 2018-04-01 LAB — RENAL FUNCTION PANEL
ALBUMIN: 1.8 g/dL — AB (ref 3.5–5.0)
Anion gap: 10 (ref 5–15)
BUN: 59 mg/dL — ABNORMAL HIGH (ref 8–23)
CO2: 19 mmol/L — ABNORMAL LOW (ref 22–32)
Calcium: 8.2 mg/dL — ABNORMAL LOW (ref 8.9–10.3)
Chloride: 88 mmol/L — ABNORMAL LOW (ref 98–111)
Creatinine, Ser: 2.26 mg/dL — ABNORMAL HIGH (ref 0.44–1.00)
GFR calc Af Amer: 26 mL/min — ABNORMAL LOW (ref >60)
GFR calc non Af Amer: 23 mL/min — ABNORMAL LOW (ref >60)
Glucose, Bld: 118 mg/dL — ABNORMAL HIGH (ref 70–99)
Phosphorus: 5.2 mg/dL — ABNORMAL HIGH (ref 2.5–4.6)
Potassium: 4.4 mmol/L (ref 3.5–5.1)
Sodium: 117 mmol/L — CL (ref 135–145)

## 2018-04-01 MED ORDER — SODIUM ZIRCONIUM CYCLOSILICATE 10 G PO PACK: 10.0000 g | PACK | Freq: Two times a day (BID) | ORAL | 0 refills | Status: AC

## 2018-04-01 MED ORDER — LACTULOSE 10 G PO PACK: 10.0000 g | PACK | Freq: Two times a day (BID) | ORAL | 3 refills | Status: AC

## 2018-04-01 MED ORDER — FUROSEMIDE 40 MG PO TABS: 20.0000 mg | ORAL_TABLET | Freq: Two times a day (BID) | ORAL | 0 refills | Status: AC

## 2018-04-01 MED ORDER — LACTULOSE 10 GM/15ML PO SOLN: 30.0000 g | Freq: Three times a day (TID) | ORAL | 0 refills | Status: AC

## 2018-04-01 NOTE — Telephone Encounter (Signed)
Pt d/c to [facility] and would like to have Dr Loletha Carrow be her attending Phys.  Please advise.   [phone] Danise Mina)

## 2018-04-01 NOTE — Progress Notes (Signed)
Physical Therapy Treatment Patient Details Name: Renee Rice MRN: [id number] DOB: 03/16/1955 Today's Date: 04/01/2018    History of Present Illness Ms. Leaver is a 20yoF with chronic hep C complicated by cirrhosis and hepatocellular carcinomapresenting with worsening hypervolemic hyponatremia and left sided pleural effusion.    PT Comments    Patient more lethargic today and reports having difficulty staying awake. Tolerated standing bouts requiring Mod A for support and elevated bed height. Able to take a few steps to get to/from [facility] with min A for balance. Sp02 dropped to 86% on 2L/min 02. Total A for pericare in standing with Min A and RW. Completely fatigued and wiped out with minimal activity today. Pt plans to d/c home with hospice. Will follow.   Follow Up Recommendations  Home health PT(HH aide)     Equipment Recommendations  Rolling walker with 5" wheels    Recommendations for Other Services       Precautions / Restrictions Precautions Precautions: Fall Precaution Comments: 2 recent falls at home Restrictions Weight Bearing Restrictions: No    Mobility  Bed Mobility Overal bed mobility: Needs Assistance Bed Mobility: Rolling;Sidelying to Sit Rolling: Min assist Sidelying to sit: Mod assist;HOB elevated   Sit to supine: Mod assist   General bed mobility comments: Increased time and effort and cues for technique, assist with trunk and rolling. Assist to bring LEs into bed.   Transfers Overall transfer level: Needs assistance Equipment used: Rolling walker (2 wheeled) Transfers: Sit to/from Stand Sit to Stand: Mod assist         General transfer comment: Assist to power to standing with height of bed elevated; stood from EOB x1, from [facility] x1.   Ambulation/Gait Ambulation/Gait assistance: Min assist Gait Distance (Feet): 3 Feet Assistive device: Rolling walker (2 wheeled) Gait Pattern/deviations: Step-to pattern Gait velocity: decreased   General  Gait Details: Able to take a few steps to get to/from [facility] with Min A for balance/safety and RW management. Lethargic. Sp02 dropped to 86% on 2L/min 02. Cues for pursed lip breathing.   Stairs             Wheelchair Mobility    Modified Rankin (Stroke Patients Only)       Balance Overall balance assessment: Needs assistance Sitting-balance support: Feet supported;No upper extremity supported Sitting balance-Leahy Scale: Fair     Standing balance support: Bilateral upper extremity supported;During functional activity Standing balance-Leahy Scale: Poor Standing balance comment: UE support needed due to LE weakness                            Cognition Arousal/Alertness: Lethargic Behavior During Therapy: WFL for tasks assessed/performed Overall Cognitive Status: Within Functional Limits for tasks assessed                                 General Comments: slower to respond today and reports falling asleep mid sentence. Not feeling like herself per report.       Exercises      General Comments        Pertinent Vitals/Pain Pain Assessment: No/denies pain    Home Living                      Prior Function            PT Goals (current goals can now be found in the care plan  section) Progress towards PT goals: Not progressing toward goals - comment(secondary to lethargy)    Frequency    Min 3X/week      PT Plan Current plan remains appropriate    Co-evaluation              AM-PAC PT "6 Clicks" Mobility   Outcome Measure  Help needed turning from your back to your side while in a flat bed without using bedrails?: A Little Help needed moving from lying on your back to sitting on the side of a flat bed without using bedrails?: A Lot Help needed moving to and from a bed to a chair (including a wheelchair)?: A Little Help needed standing up from a chair using your arms (e.g., wheelchair or bedside chair)?: A Lot Help  needed to walk in hospital room?: A Lot Help needed climbing 3-5 steps with a railing? : A Lot 6 Click Score: 14    End of Session Equipment Utilized During Treatment: Gait belt;Oxygen Activity Tolerance: Patient limited by fatigue;Patient limited by lethargy Patient left: in bed;with call bell/phone within reach;with family/visitor present;with bed alarm set Nurse Communication: Mobility status PT Visit Diagnosis: Other abnormalities of gait and mobility (R26.89);Muscle weakness (generalized) (M62.81)     Time: 5456-2563 PT Time Calculation (min) (ACUTE ONLY): 26 min  Charges:  $Gait Training: 8-22 mins $Self Care/Home Management: 8-22                     Wray Kearns, PT, DPT Acute Rehabilitation Services Pager [phone] Office [place] 04/01/2018, 12:50 PM

## 2018-04-01 NOTE — Telephone Encounter (Signed)
Spoke with Danise Mina and let her know the specialist office does not serve as the attending physician, this should be the pts PCP.

## 2018-04-01 NOTE — Progress Notes (Signed)
   Subjective: Ms. Cavanah reports that she is feeling tired, no acute events overnight. Husband is at bedside. She denies any new complaints today, denies any pain. She has been having bowel movements and has had about 3 BM yesterday. We discussed that she will be going home with home hospice today, all of their questions were answered.   Objective:  Vital signs in last 24 hours: Vitals:   03/31/18 0448 03/31/18 0756 03/31/18 2015 04/01/18 0353  BP: 94/61  (!) 100/54 (!) 93/57  Pulse: 88 83 90 82  Resp: (!) 21 14 16 17   Temp: 98.2 F (36.8 C)  97.7 F (36.5 C) (!) 97.4 F (36.3 C)  TempSrc: Oral  Oral Oral  SpO2: 94% 95% 93% 90%  Weight: 87.9 kg   88.2 kg  Height:        General: Fatigued appearing female, NAD, sitting in bed Cardiac: RRR, no m/r/g Pulmonary: CTABL, normal work of breathing Abdomen: Soft, TTP over RUQ, +BS Extremity: Bruising over bilateral arms  Assessment/Plan:  Principal Problem:   Hyponatremia with excess extracellular fluid volume Active Problems:   Ascites   Other cirrhosis of liver (HCC)   Hepatocellular carcinoma (HCC)   Goals of care, counseling/discussion   Palliative care by specialist   DNR (do not resuscitate) discussion  This is a 63 year old female with history of chronic hepatitis C complicated by cirrhosis and hepatocellular carcinoma presented with worsening hyponatremia and left-sided pleural effusion.  End stage cirrhosis Due to chronic hepatitis C and HCC. Per Dr. Loletha Carrow, her GI doctor, patient is not a candidate for TIPS or transplant.  Overall presentation concerning for decompensated cirrhosis, meld score 31.  MR in January showed esophageal varices.  Palliative care has been consulted.  They recommended outpatient hospice, patient is DNR. She was initially denied full hospice due to rifaxamin but approved for home palliative care. Rifaxamin was stopped yesterday and she will be reassessed for home hospice.  -D/c to home with home  hospice -Continue lactulose 30 g TID, goal BM 3 times a day -Stop rifaximin on discharge  Refractory hyponatremia AKI CKD stage III Hyperkalemia -Overall picture concerning for hepatorenal syndrome, no improvement with fluid restriction or IV diuresis. Appreciate nephrology recommendation, spoke with Dr. Justin Mend, no other options to offer the patient at this moment that are appropriate given overall prognosis and plans for outpatient hospice.  -Continue fluid restriction at 800 cc -IV Lasix 40 every 12 hours, change to Lasix 20 mg twice daily per nephrology on discharge -Continue strict I's and O's -PT/OT -Continue Lokelma BID  FEN: fluid restriction 800 cc/hr, replete lytes prn, HH diet  VTE ppx: Lovenox  Code Status: DNR    Dispo: Anticipated discharge pending home hospice.  Patient has had little improvement clinically and unfortunately there is nothing else to offer treatment wise. Overall poor life expectancy.   Asencion Noble, MD 04/01/2018, 6:18 AM Pager: [phone]

## 2018-04-01 NOTE — Telephone Encounter (Signed)
Copied from [place] [id number]. Topic: Quick Communication - See Telephone Encounter >> Apr 01, 2018  9:22 AM Burchel, Abbi R wrote: CRM for notification. See Telephone encounter for: 04/01/18.  Pt d/c to [facility] and would like to have Dr Nolon Rod be her attending Phys.  Please advise.   [phone] Danise Mina)

## 2018-04-01 NOTE — Plan of Care (Signed)
  Problem: Education: Goal: Knowledge of General Education information will improve Description Including pain rating scale, medication(s)/side effects and non-pharmacologic comfort measures Outcome: Progressing   Problem: Health Behavior/Discharge Planning: Goal: Ability to manage health-related needs will improve Outcome: Progressing   Problem: Clinical Measurements: Goal: Ability to maintain clinical measurements within normal limits will improve Outcome: Progressing Goal: Will remain free from infection Outcome: Progressing Goal: Diagnostic test results will improve Outcome: Progressing Goal: Cardiovascular complication will be avoided Outcome: Progressing   Problem: Safety: Goal: Ability to remain free from injury will improve Outcome: Progressing   Problem: Clinical Measurements: Goal: Respiratory complications will improve Outcome: Not Progressing

## 2018-04-02 ENCOUNTER — Inpatient Hospital Stay: Payer: BLUE CROSS/BLUE SHIELD | Admitting: Hematology

## 2018-04-02 ENCOUNTER — Inpatient Hospital Stay: Payer: BLUE CROSS/BLUE SHIELD

## 2018-04-02 NOTE — Telephone Encounter (Signed)
Please see note below. 

## 2018-04-17 DEATH — deceased

## 2019-02-04 IMAGING — MR MR ABDOMEN WO/W CM
9 of 18 series · 21 of 48 positions shown · IV contrast (gadavist)
Comparison: CT 12/09/2017. Most recent abdominal MRI of 08/10/2017.

CLINICAL DATA: Hepatitis C and cirrhosis. Prior [AGE] therapy.
Hepatocellular carcinoma.

EXAM:
MRI ABDOMEN WITHOUT AND WITH CONTRAST
TECHNIQUE: Multiplanar multisequence MR imaging of the abdomen was performed
both before and after the administration of intravenous contrast.
CONTRAST:  9 cc Gadavist

[Series 3: T2 fat-sat · axial · 5.0mm · 0.78mm/px · 1 of 57 slices shown]
[im 1/57]
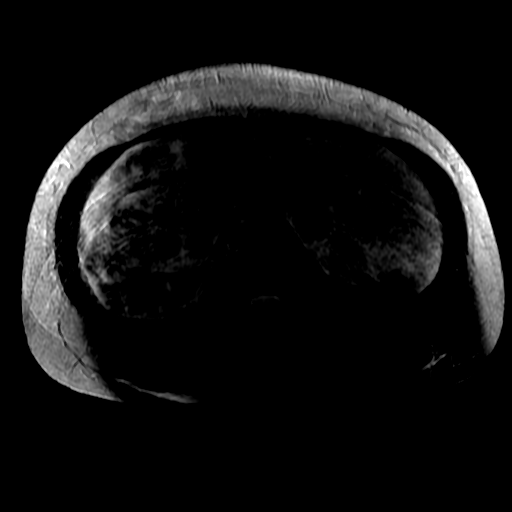

[Series 4: DWI b500 · axial · 6.0mm · 1.60mm/px · z∈[-170,+119]mm · 3 of 76 slices shown]
[im 1/76]
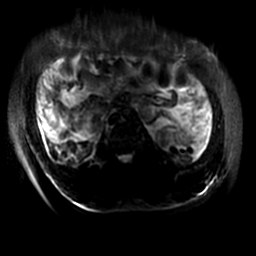
[im 38/76]
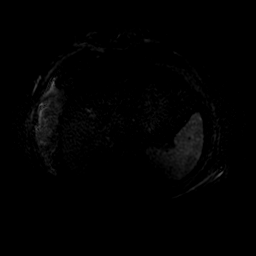
[im 76/76]
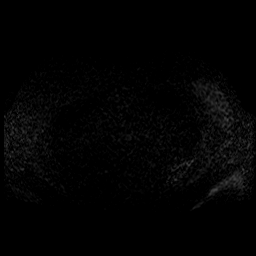

[Series 5: T2 · axial · 5.0mm · 0.78mm/px · z∈[-148,+97]mm · 2 of 50 slices shown (1 of 2)]
[im 1/50]
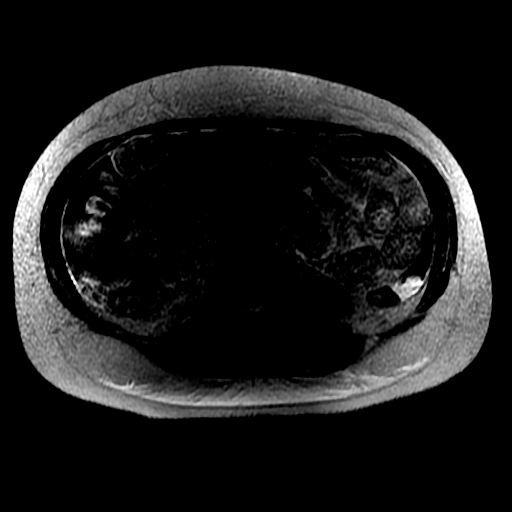
[im 50/50]
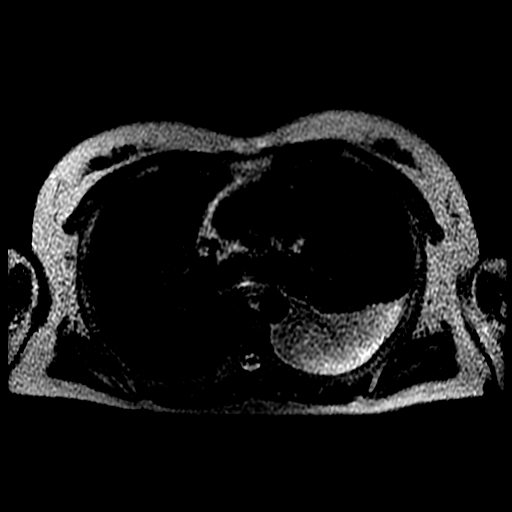

[Series 6: T2 · coronal · 5.0mm · 0.76mm/px · 2 of 54 slices shown (2 of 2)]
[im 1/54]
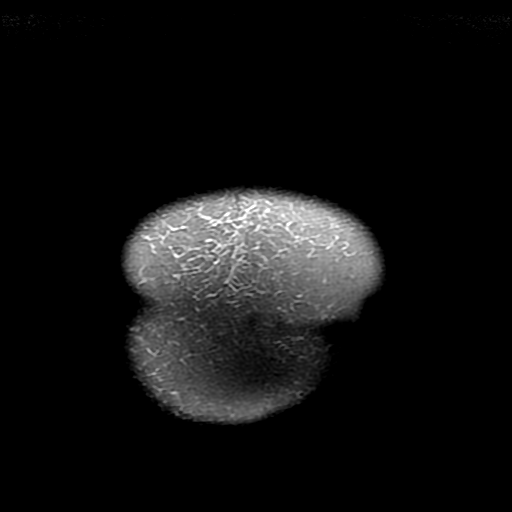
[im 54/54]
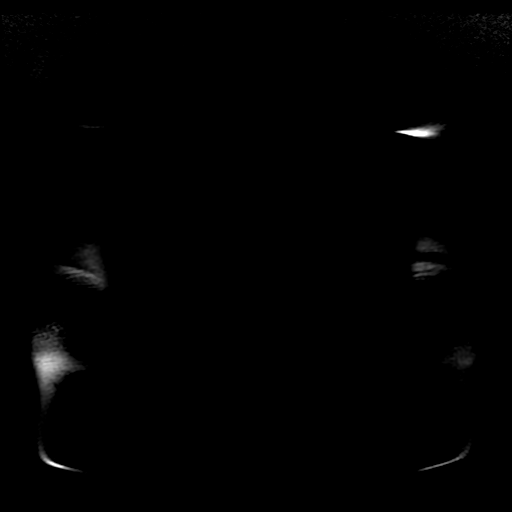

[Series 7: bSSFP · axial · 5.0mm · 0.78mm/px · z∈[-148,+97]mm · 2 of 50 slices shown]
[im 1/50]
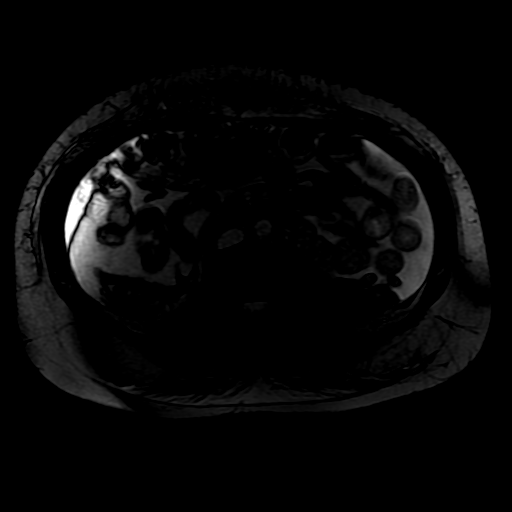
[im 50/50]
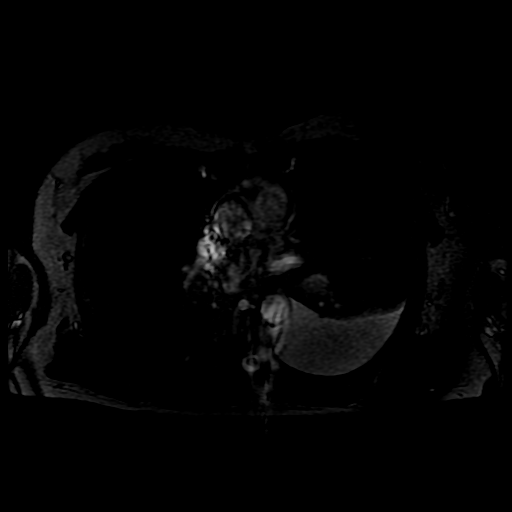

[Series 12: ax dualecho bh · axial · 5.0mm · 0.78mm/px · z∈[-148,+97]mm · 4 of 100 slices shown]
[im 1/100]
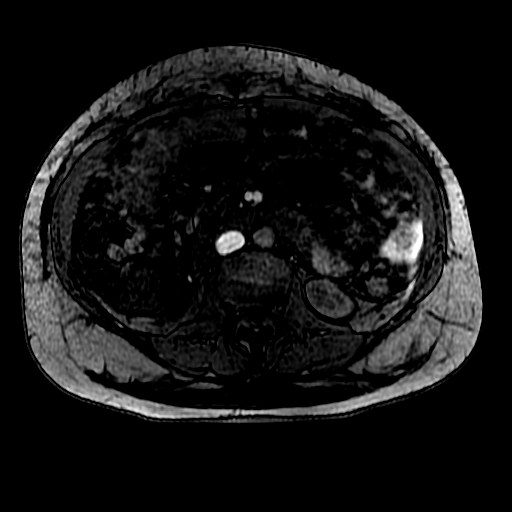
[im 34/100]
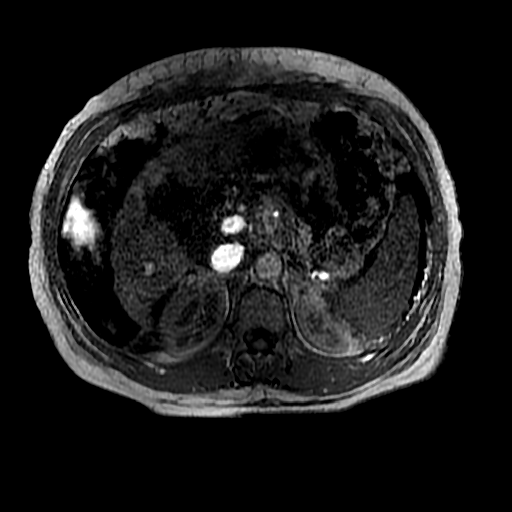
[im 67/100]
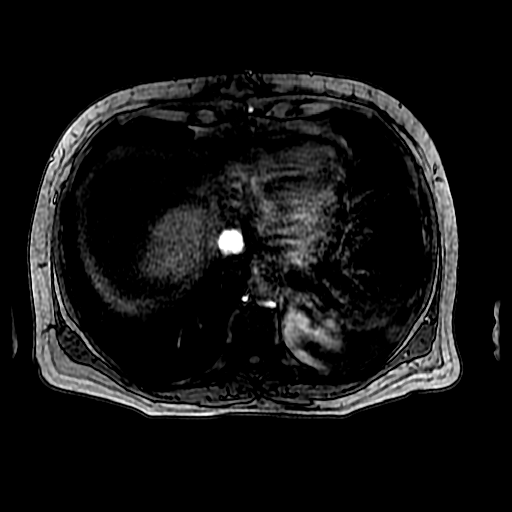
[im 100/100]
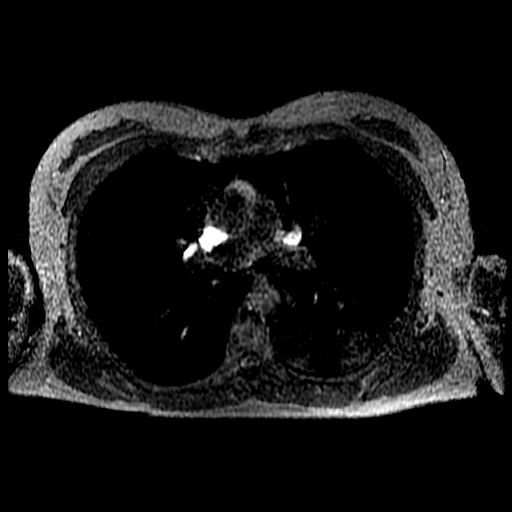

[Series 400: DWI · axial · 6.0mm · 1.60mm/px · 1 of 38 slices shown]
[im 1/38]
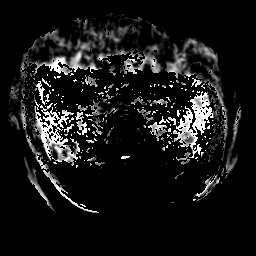

[Series 1300: T1 dynamic · axial · 5.8mm · 0.78mm/px · z∈[-154,+99]mm · 3 of 88 slices shown (1 of 2)]
[im 1/88]
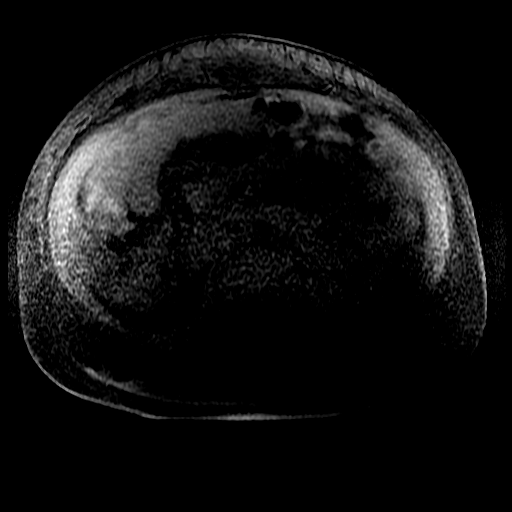
[im 44/88]
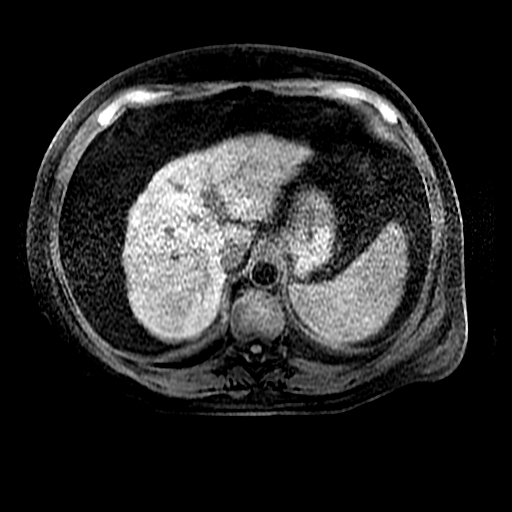
[im 88/88]
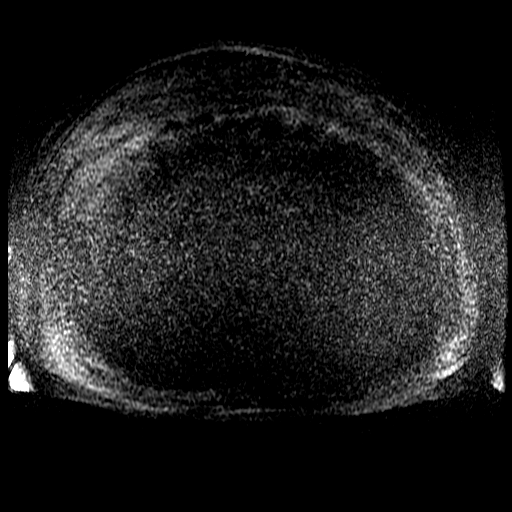

[Series 1301: T1 dynamic · axial · 5.8mm · 0.78mm/px · z∈[-154,+99]mm · 3 of 88 slices shown (2 of 2)]
[im 1/88]
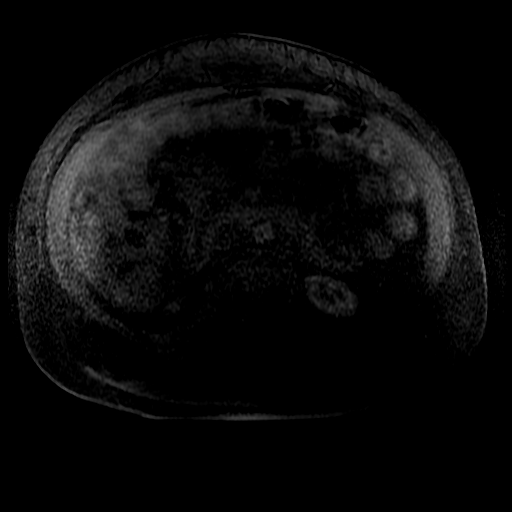
[im 44/88]
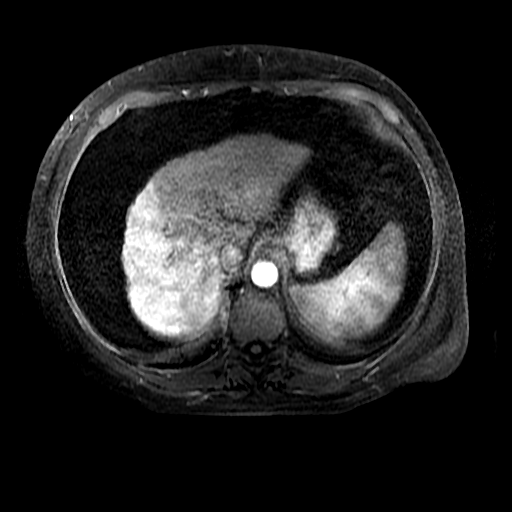
[im 88/88]
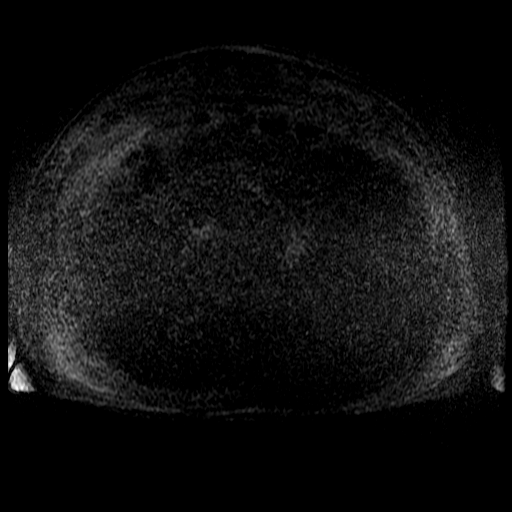

[21 of 48 positions shown; findings below may reference images not displayed]

FINDINGS: Portions of exam are mildly motion degraded.

Lower chest: Similar moderate left pleural effusion since the chest
CT of 01/07/2018. Normal heart size.

Hepatobiliary: Persistent gallbladder distension with gallstones at
up to 10 mm.

Marked cirrhosis. Heterogeneous arterial enhancement with relative
hypoenhancement throughout the left hepatic lobe. This is
nonspecific but may be treatment related.

Segment 2 hyperenhancing focus measures 10 mm on image 39/9566 1 and
is similar to 9 mm on the prior. No portal venous or delayed
hypoenhancement in this area.

The hepatocellular carcinoma within segment 3 is felt to be similar
to minimally increased. Example at 12 mm including on image 47/7797
and 45/0545. This is similar on portal venous/delayed phase imaging,
but measures slightly larger than 9 mm on prior arterial phase
imaging.

The hepatocellular carcinoma within segment 7 is decreased. This is
not well evaluated on arterial phase imaging, but measures on the
order of 8 mm on image 45/5085 versus 1.8 cm on the prior.

No new suspicious lesions are identified.

Pancreas:  Normal, without mass or ductal dilatation.

Spleen:  Normal in size, without focal abnormality.

Adrenals/Urinary Tract: Normal adrenal glands. Normal kidneys,
without hydronephrosis.

Stomach/Bowel: Gastric underdistention. Normal caliber of abdominal
bowel loops.

Vascular/Lymphatic: Marked narrowing of the celiac again identified.
Aortic atherosclerosis. Patent portal and hepatic veins. Portal
venous hypertension, with extensive portosystemic collaterals, and
esophageal varices.

No abdominal adenopathy.

Other:  Large volume ascites, new since 08/10/2017.  Anasarca.

Musculoskeletal: Mild convex left lumbar spine curvature.
IMPRESSION: 1. Redemonstration of bilateral hepatocellular carcinomas. The
segment 7 lesion is significantly decreased in size. The segment 3
lesion is similar to minimally enlarged. No new hepatocellular
carcinomas are identified.
2. Mild motion degradation.
3. Cholelithiasis.
4. Moderate left pleural effusion with large volume ascites.
5.  Aortic Atherosclerosis ([id number]).

## 2020-05-18 NOTE — Telephone Encounter (Signed)
See phone note
